# Patient Record
Sex: Female | Born: 1965
Health system: Southern US, Community
[De-identification: ages and names within clinical notes are randomized; demographics above are authoritative.]

## PROBLEM LIST (undated history)

## (undated) DIAGNOSIS — Z8639 Personal history of other endocrine, nutritional and metabolic disease: Secondary | ICD-10-CM

## (undated) DIAGNOSIS — R7303 Prediabetes: Secondary | ICD-10-CM

## (undated) DIAGNOSIS — E119 Type 2 diabetes mellitus without complications: Secondary | ICD-10-CM

## (undated) DIAGNOSIS — S83207A Unspecified tear of unspecified meniscus, current injury, left knee, initial encounter: Secondary | ICD-10-CM

## (undated) HISTORY — DX: Prediabetes: R73.03

## (undated) HISTORY — DX: Type 2 diabetes mellitus without complications: E11.9

## (undated) HISTORY — PX: KNEE ARTHROSCOPY: SUR90

---

## 1998-07-15 ENCOUNTER — Ambulatory Visit (HOSPITAL_COMMUNITY): Admission: RE | Admit: 1998-07-15 | Discharge: 1998-07-15 | Payer: Self-pay | Admitting: *Deleted

## 1998-11-20 ENCOUNTER — Inpatient Hospital Stay (HOSPITAL_COMMUNITY): Admission: AD | Admit: 1998-11-20 | Discharge: 1998-11-22 | Payer: Self-pay | Admitting: *Deleted

## 2000-03-08 ENCOUNTER — Ambulatory Visit (HOSPITAL_COMMUNITY): Admission: RE | Admit: 2000-03-08 | Discharge: 2000-03-08 | Payer: Self-pay | Admitting: *Deleted

## 2000-03-08 ENCOUNTER — Encounter: Payer: Self-pay | Admitting: *Deleted

## 2000-07-28 ENCOUNTER — Inpatient Hospital Stay (HOSPITAL_COMMUNITY): Admission: AD | Admit: 2000-07-28 | Discharge: 2000-07-30 | Payer: Self-pay | Admitting: *Deleted

## 2000-10-15 ENCOUNTER — Ambulatory Visit (HOSPITAL_COMMUNITY): Admission: RE | Admit: 2000-10-15 | Discharge: 2000-10-15 | Payer: Self-pay | Admitting: *Deleted

## 2000-10-15 HISTORY — PX: OTHER SURGICAL HISTORY: SHX169

## 2002-09-12 ENCOUNTER — Inpatient Hospital Stay (HOSPITAL_COMMUNITY): Admission: AD | Admit: 2002-09-12 | Discharge: 2002-09-15 | Payer: Self-pay | Admitting: Internal Medicine

## 2002-09-13 ENCOUNTER — Encounter: Payer: Self-pay | Admitting: Internal Medicine

## 2002-12-12 ENCOUNTER — Emergency Department (HOSPITAL_COMMUNITY): Admission: EM | Admit: 2002-12-12 | Discharge: 2002-12-12 | Payer: Self-pay | Admitting: Emergency Medicine

## 2006-12-29 ENCOUNTER — Other Ambulatory Visit: Admission: RE | Admit: 2006-12-29 | Discharge: 2006-12-29 | Payer: Self-pay | Admitting: Family Medicine

## 2007-03-16 ENCOUNTER — Ambulatory Visit (HOSPITAL_COMMUNITY): Admission: RE | Admit: 2007-03-16 | Discharge: 2007-03-16 | Payer: Self-pay | Admitting: Obstetrics and Gynecology

## 2007-03-16 HISTORY — PX: OTHER SURGICAL HISTORY: SHX169

## 2007-11-05 ENCOUNTER — Emergency Department (HOSPITAL_COMMUNITY): Admission: EM | Admit: 2007-11-05 | Discharge: 2007-11-05 | Payer: Self-pay | Admitting: Family Medicine

## 2010-10-02 ENCOUNTER — Other Ambulatory Visit
Admission: RE | Admit: 2010-10-02 | Discharge: 2010-10-02 | Payer: Self-pay | Source: Home / Self Care | Admitting: Gynecology

## 2010-10-02 ENCOUNTER — Ambulatory Visit: Payer: Self-pay | Admitting: Women's Health

## 2010-10-06 ENCOUNTER — Ambulatory Visit: Payer: Self-pay | Admitting: Women's Health

## 2010-11-26 ENCOUNTER — Other Ambulatory Visit: Payer: Self-pay | Admitting: Women's Health

## 2010-11-26 DIAGNOSIS — Z1239 Encounter for other screening for malignant neoplasm of breast: Secondary | ICD-10-CM

## 2010-12-08 ENCOUNTER — Ambulatory Visit
Admission: RE | Admit: 2010-12-08 | Discharge: 2010-12-08 | Disposition: A | Discharge status: Home / Self Care | Payer: 59 | Source: Ambulatory Visit | Attending: Women's Health | Admitting: Women's Health

## 2010-12-08 DIAGNOSIS — Z1239 Encounter for other screening for malignant neoplasm of breast: Secondary | ICD-10-CM

## 2010-12-11 ENCOUNTER — Ambulatory Visit (INDEPENDENT_AMBULATORY_CARE_PROVIDER_SITE_OTHER): Payer: 59 | Admitting: Gynecology

## 2010-12-11 DIAGNOSIS — N63 Unspecified lump in unspecified breast: Secondary | ICD-10-CM

## 2010-12-15 ENCOUNTER — Other Ambulatory Visit: Payer: Self-pay | Admitting: Gynecology

## 2010-12-15 DIAGNOSIS — N6314 Unspecified lump in the right breast, lower inner quadrant: Secondary | ICD-10-CM

## 2010-12-17 ENCOUNTER — Other Ambulatory Visit: Payer: 59

## 2010-12-22 ENCOUNTER — Ambulatory Visit
Admission: RE | Admit: 2010-12-22 | Discharge: 2010-12-22 | Disposition: A | Discharge status: Home / Self Care | Payer: 59 | Source: Ambulatory Visit | Attending: Gynecology | Admitting: Gynecology

## 2010-12-22 DIAGNOSIS — N6314 Unspecified lump in the right breast, lower inner quadrant: Secondary | ICD-10-CM

## 2011-03-13 NOTE — Op Note (Signed)
NAMECHANTELLA, [name]             ACCOUNT NO.:  [id number]   MEDICAL RECORD NO.:  [id number]          PATIENT TYPE:  AMB   LOCATION:  SDC                           FACILITY:  [facility]   PHYSICIAN:  Charles A. Delcambre, MDDATE OF BIRTH:  1966/09/30   DATE OF PROCEDURE:  03/16/2007  DATE OF DISCHARGE:                               OPERATIVE REPORT   PREOPERATIVE DIAGNOSIS:  Menorrhagia with failed medical management.   POSTOPERATIVE DIAGNOSIS:  Menorrhagia with failed medical management.   PROCEDURE:  1. ThermaChoice endometrial ablation.  2. Paracervical block.   SURGEON:  Charles A. Sydnee Cabal, M.D.   ASSISTANT:  None.   ASSISTANT:  None.   COMPLICATIONS:  None.   ESTIMATED BLOOD LOSS:  Minimal.   ANESTHESIA:  Moderate anesthesia care with IV sedation.   FINDINGS:  Sounded to 9 cm.  The instrument, sponge and needle counts  correct x2.   DESCRIPTION OF PROCEDURE:  The patient was taken to the operating room  and placed in the supine position.  Anesthesia was dosed without  difficulty.  She was then placed in the dorsal lithotomy position and a  sterile prep and drape was undertaken.  The anterior lip of the cervix  was grasped with a single-tooth tenaculum.  A weighted speculum was used  in the vagina.  The cervix was descended to the introitus with traction.  The sound was to 9 cm.  The ThermaChoice was then used.  Pressure  stabilized at 170 and gradually went down to 155 to 158.  During the  procedure, a full eight-minute therapy cycle was undertaken without  difficulty.  The tenaculum was removed.  She was awakened.  Hemostasis  was adequate.   She was taken to the recovery room with the physician in attendance,  having tolerated the procedure well.      Charles A. Sydnee Cabal, MD  Electronically Signed    CAD/MEDQ  D:  03/16/2007  T:  03/16/2007  Job:  [id number]

## 2011-03-13 NOTE — H&P (Signed)
NAMEKRIPA, FOSKEY             ACCOUNT NO.:  [id number]   MEDICAL RECORD NO.:  [id number]          PATIENT TYPE:  AMB   LOCATION:  SDC                           [facility]ILITY[facility]  [facility]   PHYSICIAN:  Charles A. Delcambre, MDDATE OF BIRTH:  [birth year]   DATE OF ADMISSION:  DATE OF DISCHARGE:                              HISTORY & PHYSICAL   HISTORY:  This patient is to be admitted Mar 02, 2007 to undergo  endometrial ablation, planned ThermaChoice, possible roller ball if  ThermaChoice technically fails.   She is a 45 year old gravida VII, para V, 0-2-4.  She has had tubal  ligation for contraception.  Last pap was within one year and was  normal.  Endometrial biopsy was negative on 01/21/07.  Sound was to 9 cm.  She is being admitted after reviewing literature for ThermaChoice  ablation, but understands we may proceed if this fails technically to  get started with roller ball ablation.  She accepts risks of infection,  bleeding, uterine perforation, bowel damage, bladder or rectal damage,  fluid overload.  All questions were answered.  She will expect 80 to 90%  eumenorrhea rate, 30 to 40% amenorrhea rate as far as my experience and  is in agreement to proceed on with heavy bleeding and clots not  responding to OTC's further.   PAST MEDICAL HISTORY:  None.   PAST SURGICAL HISTORY:  Tubal ligation.   MEDICATIONS:  Iron and multivitamins.   ALLERGIES:  No known drug allergies.   SOCIAL HISTORY:  No tobacco, ethanol or drug use.  STD exposure in the  past.  She is married.  Monogamous relationship with her husband.  Tubal  ligation has been done.  Condoms are not used.   FAMILY HISTORY:  Hypertension.  No major illness otherwise.   REVIEW OF SYSTEMS:  No fever, chills, rashes, lesions, headache,  dizziness.  Some seasonal allergies.  No chest pain, shortness of  breath, wheezing, diarrhea, constipation, bleeding, melena,  hematochezia, urgency, frequency, dysuria, incontinence,  hematuria,  galactorrhea or emotional changes.   PHYSICAL EXAMINATION:  GENERAL:  Alert and oriented x3.  VITAL SIGNS:  Blood pressure 120/80, pulse 72, respirations 16,  afebrile.  LMP was 02/08/07.  Weight is 201 pounds.  Height is 5 foot 8  inches.  HEART:  Regular rate and rhythm without murmur.  LUNGS:  Clear bilaterally.  ABDOMEN:  Soft, flat, nontender.  No hepatosplenomegaly or other masses  noted.  PELVIC:  Normal external female genitalia.  Bartholin and Skene glands  intact.  Vault without discharge or lesions.   Ultrasound did show ovaries to be normal.  There were numerous 1.5 cm  fibroids, mostly in the upper uterus, otherwise uterus size 10.2 cm x  5.6 cm x 6.3 cm.  This was not readily palpated secondary to body  habitus.  No masses were palpable.   ASSESSMENT:  Menorrhagia, failing medical management.   PLAN:  We did discuss Micronor, Depo-Provera, cycle Prometrium and  Provera, endometrial ablation, hysterectomy.  ThermaChoice will be  given.  If this were to fail we would proceed with hysterectomy likely.  All  questions were answered.  She gives informed consent.  She will  remain NPO past midnight.  Preop consent for ThermaChoice ablation,  possible roller ball use and preop CBC and will go ahead and do a urine  pregnancy test just to be on the safe side.  All questions are answered  and we will proceed as outlined.      Charles A. Sydnee Cabal, MD  Electronically Signed     CAD/MEDQ  D:  03/02/2007  T:  03/02/2007  Job:  [id number]

## 2011-03-13 NOTE — H&P (Signed)
NAME:  Heidi, [name] NO.:  [id number]   MEDICAL RECORD NO.:  [id number]                   PATIENT TYPE:  INP   LOCATION:  5743                                 FACILITY:  [facility]   PHYSICIAN:  Marcene Duos, M.D.         DATE OF BIRTH:  04/11/1966   DATE OF ADMISSION:  09/12/2002  DATE OF DISCHARGE:                                HISTORY & PHYSICAL   CHIEF COMPLAINT:  Fever and left flank pain.   HISTORY OF PRESENT ILLNESS:  This is a 45 year old female patient of mine I  saw yesterday in the office with a fever up to 103.8 and a history of left  flank pain for the third day and myalgias.  The patient was found on  examination to have low left back tenderness possibly flank tenderness, some  left abdominal discomfort on palpation, but no rebound or peritoneal signs.  Urinalysis showed 2+ leukocytes, positive nitrites, trace protein, 2+ blood.  White count was 7.8 with 86.2% granulocytes, 12% lymphocytes.  Hemoglobin  was 10.1, MCV 81, platelets 207.  BUN and creatinine were 3 and 1.1,  respectively.  The patient was felt to have left pyelonephritis, and was  given 2 g of IM ceftriaxone and to follow up today.   On history today, the patient is worse with fevers, chills, myalgias,  weakness, and now with nausea which she had not had previously, though she  has had no vomiting yet.  She is taking in some fluids, but is having  difficulties doing so.  She has only urinated once today.  She denies any  diarrhea, melena, hematochezia, and no dysuria.   PAST MEDICAL HISTORY:  1. Gravida 4, para 4.  2. Bilateral tubal ligation in 12/01.   MEDICATIONS:  Ceftriaxone 2 g IM x1.   ALLERGIES:  No known drug allergies.   FAMILY HISTORY:  Mother died at age 53 in June 15, 2023, of what sounds like  metastatic throat cancer.  Her father also died at 21 of the same.  Brother  age 25 has been diagnosed with prostate cancer.  She has four healthy  children.   SOCIAL HISTORY:  She has been married for 10 years, she is a housewife.   REVIEW OF SYMPTOMS:  Negative except for the above.   PHYSICAL EXAMINATION:  VITAL SIGNS:  Temperature 103.4, blood pressure  118/80, pulse 100.  HEENT:  Mucous membranes are moist.  The patient is tearful.  Conjunctivae  without injection.  Tympanic membranes are clear.  Throat is without  injection.  NECK:  Supple without adenopathy.  CHEST:  Clear.  CARDIOVASCULAR:  Regular rate and rhythm with normal S1 and S2, no S3, S4,  or murmur appreciated.  Carotid, radial, femoral, dorsalis pedis, and  posterior tibialis pulses are normal, dynamic, and equal.  ABDOMEN:  Soft, bowel sounds present throughout.  She does have some  tenderness in the left mid to lower abdomen and has  definite left flank  tenderness today.  No rebound, no peritoneal signs.  No organomegaly or  masses appreciated.  NEUROLOGIC:  Alert and oriented x3.  Cranial nerves II-XII intact.  Motor is  grossly normal.  GENERAL:  The patient is actively chilling in the room.   ASSESSMENT AND PLAN:  1. Left pyelonephritis with failure of outpatient treatment.  Because she     appears fairly ill at this time, I am going to check a renal ultrasound     to rule out obstruction.  We will admit her for inpatient treatment,     switch her antibiotics to IV Cipro.  We will check a CBC and a BMET in     the morning.  Her urine culture from yesterday is still pending.     Darvocet for pain, fever, as well as ibuprofen for pain and fever     intermittently.  I have written for morphine if she is unable to take     p.o. for pain medications, and most importantly IV fluids at this point.  2. Anemia, normocytic, possible marrow suppression.  Again, we will check a     CBC tomorrow.  If that continues to drop, we will check iron studies,     workup otherwise as an outpatient if it continues.                                               Marcene Duos,  M.D.    EMM/MEDQ  D:  09/13/2002  T:  09/13/2002  Job:  [id number]

## 2011-03-13 NOTE — H&P (Signed)
[facility]  Patient:    [name], [name]                    MRN: [id number] Adm. Date:  [id number] Disc. Date: [id number] Attending:  Deniece Ree                         History and Physical  CHIEF COMPLAINT:              The patient is a 45 year old multiparous female who desires permanent sterilization.  HISTORY OF PRESENT ILLNESS:   The patient understands the different types of contraception available; however, is very adamant about having permanent sterilization done.  She understands the possible complications and she understands the procedure.  She also understands that this procedure is intended to be permanent; however, it cannot be guaranteed.  PAST MEDICAL HISTORY:         Noncontributory.  FAMILY HISTORY:               Noncontributory.  REVIEW OF SYSTEMS:            Noncontributory.  PHYSICAL EXAMINATION:  GENERAL:                      The patient is a well-developed, well-nourished female in no acute distress.  HEENT:                        Within normal limits.  NECK:                         Supple.  BREAST:                       Without masses, tenderness, or discharge.  LUNGS:                        Clear to auscultation and percussion.  HEART:                        Normal sinus rhythm without murmurs, rubs, or gallops.  ABDOMEN:                      Benign.  EXTREMITIES:                  Within normal limits.  NEUROLOGIC:                   Within normal limits.  PELVIC:                       External genitalia and BUS within normal limits. The vagina is clear.  The cervix is firm and nontender.  The uterus is normal size, shape, and consistency.  Adnexa benign.  DIAGNOSIS:                    Multiparity with desire for permanent sterilization.  PLAN:                         The plan is for bilateral tubal cauterization with tubal resection. DD:  10/15/00 TD:  10/15/00 Job: [id number]

## 2011-03-13 NOTE — Discharge Summary (Signed)
NAME:  [name], [name]                       ACCOUNT NO.:  [id number]   MEDICAL RECORD NO.:  [id number]                   PATIENT TYPE:  INP   LOCATION:  5743                                 FACILITY:  [facility]   PHYSICIAN:  Marcene Duos, M.D.         DATE OF BIRTH:  06-22-1966   DATE OF ADMISSION:  09/12/2002  DATE OF DISCHARGE:  09/15/2002                                 DISCHARGE SUMMARY   RADIOLOGIC FINDINGS:  Renal ultrasound 09/13/02 normal.   ADMISSION DIAGNOSES:  1. Left pyelonephritis.  2. Normocytic anemia.   DISCHARGE DIAGNOSES:  1. Left pyelonephritis.  2. Microcytic anemia, iron deficiency secondary to #3.  3. Menometrorrhagia.   HISTORY OF PRESENT ILLNESS/HOSPITAL COURSE:  This is a 45 year old female  patient who failed outpatient treatment of left pyelonephritis. She  presented to the office the day prior to admission with fever up to 103.8  and history of left flank pain for three days with myalgias. She is found to  have low left back tenderness on exam as well as some left mid abdominal  discomfort. White count was 10.8 with 86.2% granulocytes, hemoglobin 10.1,  BUN and creatinine were 3 and 1.1 respectively. Urinalysis showed a large  amount of leukocytes, positive nitrates, and many bacteria. The patient  received 2 g of IM ceftriaxone in the office, and she was taking fluids well  at that point. The following day, however, she was feeling worse with  fevers, chills, myalgias, weakness, and with the addition of nausea and the  inability to take fluids well. The patient's temperature was also 103.4 with  continued left flank tenderness on admission exam.   HOSPITAL COURSE:  Problem 1. Left pyelonephritis. The patient was switched  to IV Cipro, and on admission and because of her worsening state, a renal  ultrasound was performed the morning of 09/12/02 which showed no evidence of  hydronephrosis or other abnormality. The patient actually was  feeling better  by the following morning but still with low grade temperature. She was  starting to take fluids well. By 11/19, she was much better with no left  tenderness but continued with fever through 09/14/02. Today on 09/15/02, she  has been afebrile for 24 hours. She is otherwise asymptomatic, taking fluids  well, and is stable. The patient was continued on IV fluids for hydration  throughout the hospitalization. Those were discontinued at time of  discharge, as she was taking p.o. well.  Problem 2. Anemia, now microcytic. The patient during her hospitalization  did remind me that she has a history of menometrorrhagia. We have placed her  on birth control pills last spring, but she has not been good about taking  them consistently and continues to have problems with menstrual bleeding.  Her hemoglobin was down to 9.2 on 09/13/02, remained stable throughout the  rest of the hospitalization, and she was initiated on iron at discharge,  ferrous sulfate 325 mg twice daily.  Problem 3. Leukopenia. The patient developed low white cell count to a low  of 3.2 at discharge. This was felt secondary probably to marrow suppression  with her illness.    DISCHARGE INSTRUCTIONS:  The patient is thus discharged in stable condition  to continue Cipro 500 mg p.o. b.i.d. for 11 more days, to continue pushing  fluids, and ferrous sulfate 325 mg twice daily. She will follow up with me  in two weeks, and we will discuss whether there are other options that are  better for her for treatment of her menometrorrhagia.                                               Marcene Duos, M.D.    EMM/MEDQ  D:  09/15/2002  T:  09/15/2002  Job:  [id number]

## 2011-03-13 NOTE — Op Note (Signed)
[facility]  Patient:    [name], [name]                    MRN: [id number] Proc. Date: 10/15/00 Adm. Date:  [id number] Disc. Date: [id number] Attending:  Deniece Ree                           Operative Report  PREOPERATIVE DIAGNOSES:       Multiparity, desire for permanent sterilization.  POSTOPERATIVE DIAGNOSES:      Multiparity, desire for permanent sterilization.  OPERATION:                    Laparoscopic bilateral tubal cauterization with tubal resection.  SURGEON:                      Deniece Ree, M.D.  ANESTHESIA:                   General.  ESTIMATED BLOOD LOSS:         Less than 25 cc.  COMPLICATIONS:                None.                                Patient tolerated the procedure well and returned to the recovery room in satisfactory condition.  PROCEDURE:                    Patient was taken to the operating room, prepped and draped in the usual fashion for laparoscopic surgery.  A speculum was placed in the vagina following which the anterior lip of the cervix was then grasped with a Christella Hartigan tenaculum.  The acorn uterine manipulating cannula was put into place.  A subumbilical incision was then made following which the Veress needle was introduced through this incision and approximately 3 L of carbon dioxide was then infused without any difficulty.  The laparoscopic Trocar was placed through this incision following which the laparoscope was placed through its sleeve.  Visualization of the pelvic organs came into view.  Uterus, tubes, and ovaries all appeared to be within normal limits. The right tube was then grasped approximately 5 mm proximal to the right cornu and cauterized approximately 3-4 cm in length.  This was done likewise on the opposite side.  Both cauterized tubal segments were then cut in two locations. At this point hemostasis was present.  Sponge and needle count was correct x 2.  The carbon dioxide was  then allowed to escape from the abdominal cavity which it did so without any difficulty.  The incision was then closed, first with a deep interrupted stitch followed by a subcuticular stitch using 4-0 Vicryl.  Procedure terminated.  Patient tolerated procedure well and returned to the recovery room in satisfactory condition.  Patient is to be discharged when fully alert.  She has been instructed on the possible complications and care following this type of surgery.  She will be told to return to my office in four weeks for follow-up evaluation or to call me prior to that time should any problems arise. DD:  10/15/00 TD:  10/15/00 Job: [id number]

## 2011-07-07 ENCOUNTER — Other Ambulatory Visit: Payer: Self-pay

## 2011-07-07 ENCOUNTER — Other Ambulatory Visit: Payer: Self-pay | Admitting: Gynecology

## 2011-07-07 MED ORDER — IBUPROFEN 600 MG PO TABS: 600.0000 mg | ORAL_TABLET | Freq: Three times a day (TID) | ORAL | Status: AC | PRN

## 2011-07-07 NOTE — Telephone Encounter (Signed)
ROUTED RX TO PHARMACY.

## 2011-07-07 NOTE — Telephone Encounter (Signed)
Okayed her refill.

## 2011-07-07 NOTE — Telephone Encounter (Signed)
PT. REQUESTING REFILL ON GENERIC MOTRIN 600MG . FOR SEVERE PERIOD CRAMPS. SEE NANCY'S NURSE NOTE OF 12-22-10. IF OK I WILL SEND TO HER [facility].

## 2011-07-15 LAB — POCT RAPID STREP A: Streptococcus, Group A Screen (Direct): POSITIVE — AB

## 2011-07-28 ENCOUNTER — Other Ambulatory Visit: Payer: Self-pay | Admitting: Gynecology

## 2011-07-29 ENCOUNTER — Other Ambulatory Visit: Payer: Self-pay | Admitting: *Deleted

## 2011-07-29 DIAGNOSIS — N63 Unspecified lump in unspecified breast: Secondary | ICD-10-CM

## 2011-08-13 ENCOUNTER — Ambulatory Visit
Admission: RE | Admit: 2011-08-13 | Discharge: 2011-08-13 | Disposition: A | Discharge status: Home / Self Care | Payer: 59 | Source: Ambulatory Visit | Attending: Obstetrics and Gynecology | Admitting: Obstetrics and Gynecology

## 2011-08-13 DIAGNOSIS — N63 Unspecified lump in unspecified breast: Secondary | ICD-10-CM

## 2011-11-26 ENCOUNTER — Ambulatory Visit (INDEPENDENT_AMBULATORY_CARE_PROVIDER_SITE_OTHER): Payer: 59 | Admitting: Women's Health

## 2011-11-26 ENCOUNTER — Other Ambulatory Visit (HOSPITAL_COMMUNITY)
Admission: RE | Admit: 2011-11-26 | Discharge: 2011-11-26 | Disposition: A | Discharge status: Home / Self Care | Payer: 59 | Source: Ambulatory Visit | Attending: Obstetrics and Gynecology | Admitting: Obstetrics and Gynecology

## 2011-11-26 ENCOUNTER — Encounter: Payer: Self-pay | Admitting: Women's Health

## 2011-11-26 VITALS — BP 120/80 | Ht 68.25 in | Wt 216.0 lb

## 2011-11-26 DIAGNOSIS — Z833 Family history of diabetes mellitus: Secondary | ICD-10-CM

## 2011-11-26 DIAGNOSIS — Z13220 Encounter for screening for lipoid disorders: Secondary | ICD-10-CM

## 2011-11-26 DIAGNOSIS — Z01419 Encounter for gynecological examination (general) (routine) without abnormal findings: Secondary | ICD-10-CM

## 2011-11-26 DIAGNOSIS — N946 Dysmenorrhea, unspecified: Secondary | ICD-10-CM

## 2011-11-26 DIAGNOSIS — R319 Hematuria, unspecified: Secondary | ICD-10-CM

## 2011-11-26 DIAGNOSIS — R7309 Other abnormal glucose: Secondary | ICD-10-CM

## 2011-11-26 LAB — URINALYSIS W MICROSCOPIC + REFLEX CULTURE
Bilirubin Urine: NEGATIVE
Crystals: NONE SEEN
Leukocytes, UA: NEGATIVE
Protein, ur: NEGATIVE mg/dL
Specific Gravity, Urine: 1.025 (ref 1.005–1.030)
Urobilinogen, UA: 0.2 mg/dL (ref 0.0–1.0)

## 2011-11-26 LAB — CBC WITH DIFFERENTIAL/PLATELET
Basophils Absolute: 0 10*3/uL (ref 0.0–0.1)
Basophils Relative: 1 % (ref 0–1)
Eosinophils Absolute: 0.1 10*3/uL (ref 0.0–0.7)
MCH: 28.5 pg (ref 26.0–34.0)
MCHC: 33 g/dL (ref 30.0–36.0)
Monocytes Absolute: 0.2 10*3/uL (ref 0.1–1.0)
Monocytes Relative: 5 % (ref 3–12)
Neutro Abs: 3.2 10*3/uL (ref 1.7–7.7)
Neutrophils Relative %: 60 % (ref 43–77)
RDW: 12.9 % (ref 11.5–15.5)

## 2011-11-26 LAB — LIPID PANEL
Cholesterol: 177 mg/dL (ref 0–200)
Total CHOL/HDL Ratio: 4.5 Ratio
VLDL: 20 mg/dL (ref 0–40)

## 2011-11-26 LAB — GLUCOSE, RANDOM: Glucose, Bld: 113 mg/dL — ABNORMAL HIGH (ref 70–99)

## 2011-11-26 MED ORDER — IBUPROFEN 600 MG PO TABS: 600.0000 mg | ORAL_TABLET | Freq: Four times a day (QID) | ORAL | Status: DC | PRN

## 2011-11-26 NOTE — Progress Notes (Signed)
Addended by: Venora Maples on: 11/26/2011 12:46 PM   Modules accepted: Orders

## 2011-11-26 NOTE — Progress Notes (Signed)
Addended by: Venora Maples on: 11/26/2011 10:34 AM   Modules accepted: Orders

## 2011-11-26 NOTE — Progress Notes (Signed)
[name] 06/07/1966 [id number]    History:    The patient presents for annual exam.  Monthly 4-5 day light cycle with severe cramps. Minimal relief with Motrin, minimal relief with OCs in the past.  History of fibroids, BTL and endometrial ablation in 08. History of normal Paps. History of a questionable breast cyst on mammogram, scheduled for followup ultrasound with possible biopsy next week.   Past medical history, past surgical history, family history and social history were all reviewed and documented in the EPIC chart. 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   ROS:  A  ROS was performed and pertinent positives and negatives are included in the history.  Exam:  Filed Vitals:   11/26/11 0856  BP: 120/80    General appearance:  Normal Head/Neck:  Normal, without cervical or supraclavicular adenopathy. Thyroid:  Symmetrical, normal in size, without palpable masses or nodularity. Respiratory  Effort:  Normal  Auscultation:  Clear without wheezing or rhonchi Cardiovascular  Auscultation:  Regular rate, without rubs, murmurs or gallops  Edema/varicosities:  Not grossly evident Abdominal  Soft,nontender, without masses, guarding or rebound.  Liver/spleen:  No organomegaly noted  Hernia:  None appreciated  Skin  Inspection:  Grossly normal  Palpation:  Grossly normal Neurologic/psychiatric  Orientation:  Normal with appropriate conversation.  Mood/affect:  Normal  Genitourinary    Breasts: Examined lying and sitting.     Right: Without masses, retractions, discharge or axillary adenopathy.     Left: Without masses, retractions, discharge or axillary adenopathy.   Inguinal/mons:  Normal without inguinal adenopathy  External genitalia:  Normal  BUS/Urethra/Skene's glands:  Normal  Bladder:  Normal  Vagina:  Normal  Cervix:  Normal  Uterus:  anteverted, fibroids,  Midline and mobile  Adnexa/parametria:     Rt: Without masses or  tenderness.   Lt: Without masses or tenderness.  Anus and perineum: Normal  Digital rectal exam: Normal sphincter tone without palpated masses or tenderness  Assessment/Plan:  46 y.o. MBF G7 P4  for annual exam with complaint of severe dysmenorrhea.  Dysmenorrhea/fibroids/ablation in 08 Right breast questionable cyst  Plan: Motrin 600 prescription, proper use reviewed for dysmenorrhea . Options reviewed, discussed hysterectomy, slight risk for infection, hemorrhage, perforation of organs. Hand out given. Instructed to schedule appointment with Dr. Audie Box to discuss if chooses. SBE's, annual mammogram, keep scheduled appointment for ultrasound was questionable biopsy next week. Instructed to increase regular exercise, cut calories for weight loss, calcium rich diet, MVI daily. CBC, glucose, lipid profile, UA and Pap.    Harrington Challenger [facility], 9:52 AM 11/26/2011

## 2011-11-27 ENCOUNTER — Other Ambulatory Visit: Payer: Self-pay | Admitting: Obstetrics and Gynecology

## 2011-11-27 DIAGNOSIS — Z1231 Encounter for screening mammogram for malignant neoplasm of breast: Secondary | ICD-10-CM

## 2011-11-28 LAB — URINE CULTURE: Colony Count: >=100000

## 2011-12-01 NOTE — Progress Notes (Signed)
Addended by: Venora Maples on: 12/01/2011 02:29 PM   Modules accepted: Orders

## 2011-12-18 ENCOUNTER — Ambulatory Visit (INDEPENDENT_AMBULATORY_CARE_PROVIDER_SITE_OTHER): Payer: 59 | Admitting: Gynecology

## 2011-12-18 ENCOUNTER — Encounter: Payer: Self-pay | Admitting: Gynecology

## 2011-12-18 VITALS — BP 128/84

## 2011-12-18 DIAGNOSIS — N946 Dysmenorrhea, unspecified: Secondary | ICD-10-CM

## 2011-12-18 NOTE — Progress Notes (Signed)
Patient presents to discuss treatment options for her dysmenorrhea. Patient normally sees [place] and had a complete annual exam this year. She had a ThermaChoice endometrial ablation by Dr. Sydnee Cabal in 2009 initially did well with no bleeding and no discomfort. Her menses have returned light but her pain and cramping is significantly to her bedridden for several days each month. She has no pain in between her menses and no dyspareunia.  Exam with Elane Fritz chaperone present Abdomen soft nontender without masses guarding rebound organomegaly Pelvic external BUS vagina normal. Cervix normal. Uterus grossly normal size midline mobile nontender. Adnexa without masses or tenderness.  Assessment and plan:46 year old G7 P. For Ab3 female status post tubal sterilization with significant dysmenorrhea status post endometrial ablation. Recommend we start with ultrasound to rule out significant hematometria or other pathology. I reviewed options with her to include hormonal suppression/attempted D&C/hysterectomy. I think in her situation a vaginal hysterectomy would be appropriate I reviewed with her which involved with this. Patient wants to think of her options and she will follow up with me after the ultrasound.

## 2011-12-18 NOTE — Patient Instructions (Signed)
Follow up for ultrasound. 

## 2011-12-24 ENCOUNTER — Ambulatory Visit: Payer: 59

## 2011-12-25 ENCOUNTER — Ambulatory Visit (INDEPENDENT_AMBULATORY_CARE_PROVIDER_SITE_OTHER): Payer: 59

## 2011-12-25 ENCOUNTER — Encounter: Payer: Self-pay | Admitting: Gynecology

## 2011-12-25 ENCOUNTER — Ambulatory Visit (INDEPENDENT_AMBULATORY_CARE_PROVIDER_SITE_OTHER): Payer: 59 | Admitting: Gynecology

## 2011-12-25 DIAGNOSIS — N946 Dysmenorrhea, unspecified: Secondary | ICD-10-CM

## 2011-12-25 DIAGNOSIS — N949 Unspecified condition associated with female genital organs and menstrual cycle: Secondary | ICD-10-CM

## 2011-12-25 DIAGNOSIS — R102 Pelvic and perineal pain: Secondary | ICD-10-CM

## 2011-12-25 NOTE — Progress Notes (Signed)
Patient presents for ultrasound due to her history of worsening pelvic cramping, status post endometrial ablation. Ultrasound shows multiple small myomas. Endometrial echo 8.8 mm without evidence of hematometria. Right/left ovaries visualized and normal. I reviewed the situation with the patient and again her options and she wants to proceed with hysterectomy. I think she would be a good candidate for a vaginal hysterectomy having 4 vaginal births and we will go ahead and schedule her for this. The ovarian conservation issue was reviewed with her and the options of keeping her ovaries versus removing them discussed. The issues of continued hormone production also the continued risk for ovarian disease up to and including ovarian cancer versus removing her ovaries and the issues of hypoestrogenism, symptoms, accelerated cardiovascular and osteoporotic risk and the issue of ERT risk/benefits was all discussed and she would want to keep her ovaries. Patient will follow for her preop consult once her surgery is scheduled

## 2011-12-25 NOTE — Patient Instructions (Signed)
Office will contact you to schedule surgery. 

## 2011-12-28 ENCOUNTER — Telehealth: Payer: Self-pay

## 2011-12-28 NOTE — Telephone Encounter (Signed)
Spoke with patient. She wanted to schedule her TV[facility] the week of Spring Break. She picked April 1st and I scheduled her that day at 7:30am. Her preop consult with Dr. Velvet Bathe is 3/26 and she will expect a call from [facility] regarding her preop appt there as well.

## 2012-01-04 ENCOUNTER — Encounter (HOSPITAL_COMMUNITY): Payer: Self-pay

## 2012-01-18 ENCOUNTER — Encounter (HOSPITAL_COMMUNITY): Payer: Self-pay

## 2012-01-18 ENCOUNTER — Encounter (HOSPITAL_COMMUNITY)
Admission: RE | Admit: 2012-01-18 | Discharge: 2012-01-18 | Disposition: A | Discharge status: Home / Self Care | Payer: 59 | Source: Ambulatory Visit | Attending: Gynecology | Admitting: Gynecology

## 2012-01-18 LAB — CBC
HCT: 37 % (ref 36.0–46.0)
Hemoglobin: 12.9 g/dL (ref 12.0–15.0)
MCHC: 34.9 g/dL (ref 30.0–36.0)
RBC: 4.37 MIL/uL (ref 3.87–5.11)
WBC: 7 10*3/uL (ref 4.0–10.5)

## 2012-01-18 LAB — SURGICAL PCR SCREEN: Staphylococcus aureus: NEGATIVE

## 2012-01-18 NOTE — Patient Instructions (Signed)
YOUR PROCEDURE IS SCHEDULED ON: 01/25/12  ENTER THROUGH THE MAIN ENTRANCE OF [facility] H[facility] AT: 0600 am  USE DESK PHONE AND DIAL [phone] TO INFORM us OF YOUR ARRIVAL  CALL [phone] IF YOU HAVE ANY QUESTIONS OR PROBLEMS PRIOR TO YOUR ARRIVAL.  REMEMBER: DO NOT EAT OR DRINK AFTER MIDNIGHT : Sunday  SPECIAL INSTRUCTIONS:   YOU MAY BRUSH YOUR TEETH THE MORNING OF SURGERY   TAKE THESE MEDICINES THE DAY OF SURGERY WITH SIP OF WATER:none   DO NOT WEAR JEWELRY, EYE MAKEUP, LIPSTICK OR DARK FINGERNAIL POLISH DO NOT WEAR LOTIONS  DO NOT SHAVE FOR 48 HOURS PRIOR TO SURGERY  YOU WILL NOT BE ALLOWED TO DRIVE YOURSELF HOME.  NAME OF DRIVERDelila West- [phone]- home

## 2012-01-19 ENCOUNTER — Ambulatory Visit (INDEPENDENT_AMBULATORY_CARE_PROVIDER_SITE_OTHER): Payer: 59 | Admitting: Gynecology

## 2012-01-19 ENCOUNTER — Encounter: Payer: Self-pay | Admitting: Gynecology

## 2012-01-19 DIAGNOSIS — D259 Leiomyoma of uterus, unspecified: Secondary | ICD-10-CM

## 2012-01-19 DIAGNOSIS — N946 Dysmenorrhea, unspecified: Secondary | ICD-10-CM

## 2012-01-19 NOTE — H&P (Signed)
  [name] July 26, 1966 [id number]   History and Physical    Chief complaint: dysmenorrhea, leiomyoma  History of present illness: 46 y.o.  G7 P4 status post tubal sterilization and ThermaChoice endometrial ablation with worsening unacceptable dysmenorrhea. Her menses are relatively light but she is having disabling cramping with them. Ultrasound showed multiple small myomas, no evidence of hematometria.  Options for management were reviewed and she elects for vaginal hysterectomy.   Past medical history,surgical history, medications, allergies, family history and social history were all reviewed and documented in the EPIC chart. ROS:  Was performed and pertinent positives and negatives are included in the history of present illness.  Exam: General: well developed, well nourished female, no acute distress HEENT: normal  Lungs: clear to auscultation without wheezing, rales or rhonchi  Cardiac: regular rate without rubs, murmurs or gallops  Abdomen: soft, nontender without masses, guarding, rebound, organomegaly  Pelvic: external bus vagina: normal   Cervix: grossly normal  Uterus: normal size, midline and mobile, nontender  Adnexa: without masses or tenderness     Assessment/Plan:  46 year old G7 P4 status post tubal sterilization and endometrial ablation with worsening dysmenorrhea. Ultrasound shows no evidence of hematometria but multiple small myomas. Options for management were reviewed and the patient wants to proceed with hysterectomy. Uterus is generous on ultrasound although grossly normal on bimanual. The plan will be for attempted [facility] with possible LAVH back up. Patient understands at any time during the procedure if it becomes unsafe to proceed with the vaginal approach or if complications arise I may convert this to a total abdominal hysterectomy and she understands and accepts this. The ovarian conservation issue was reviewed with her the options to remove both ovaries or  keep both ovaries was discussed. Patient understands that if her ovaries are removed she will have accelerated menopausal symptoms, accelerated cardiovascular risk and osteoporosis risk. By keeping her ovaries she will keep the risk of ovarian disease with possible treatments or surgeries in the future as well as the risk of ovarian cancer. After a lengthy discussion the patient wants to keep both ovaries and she accepts the risk of ovarian cancer and benign ovarian disease but she does give me permission to remove one or both ovaries at the time of surgery if significant disease is encountered and it is my best judgment to do so intraoperatively.  Absolute irreversible sterility associated with hysterectomy was reviewed. Sexuality following hysterectomy was also discussed and the risk of persistent orgasmic dysfunction and persistent dyspareunia was reviewed with her. The risk of infection requiring prolonged antibiotics as well as risk of abscess/hematoma formation requiring reoperation and drainage was reviewed with her. The risk of hemorrhage necessitating transfusion and the risks of transfusion discussed to include transfusion reaction, hepatitis, HIV, mad cow disease and other unknown entities. The risk of inadvertent injury to internal organs, either immediately recognized or delay recognized, including bowel, bladder, ureters, vessels and nerves necessitating major exploratory reparative surgeries and future reparative surgeries including bladder repair, bowel resection, ureteral damage repair and ostomy formation was all reviewed with her. The patient's questions were answered to her satisfaction she is ready to proceed with surgery.   Dara Lords MD, 10:08 AM 01/19/2012

## 2012-01-19 NOTE — Progress Notes (Signed)
Aniesha Haughn Weingarten Jan 08, 1966 [id number]   Preoperative consult    Chief complaint: dysmenorrhea, leiomyoma  History of present illness: 46 y.o.  G7 P4 status post tubal sterilization and ThermaChoice endometrial ablation with worsening unacceptable dysmenorrhea. Her menses are relatively light but she is having disabling cramping with them. Ultrasound showed multiple small myomas, no evidence of hematometria.  Options for management were reviewed and she elects for vaginal hysterectomy.   Past medical history,surgical history, medications, allergies, family history and social history were all reviewed and documented in the EPIC chart. ROS:  Was performed and pertinent positives and negatives are included in the history of present illness.  Exam: General: well developed, well nourished female, no acute distress HEENT: normal  Lungs: clear to auscultation without wheezing, rales or rhonchi  Cardiac: regular rate without rubs, murmurs or gallops  Abdomen: soft, nontender without masses, guarding, rebound, organomegaly  Pelvic: external bus vagina: normal   Cervix: grossly normal  Uterus: normal size, midline and mobile, nontender  Adnexa: without masses or tenderness     Assessment/Plan:  46 year old G7 P4 status post tubal sterilization and endometrial ablation with worsening dysmenorrhea. Ultrasound shows no evidence of hematometria but multiple small myomas. Options for management were reviewed and the patient wants to proceed with hysterectomy. Uterus is generous on ultrasound although grossly normal on bimanual. The plan will be for attempted [facility] with possible LAVH back up. Patient understands at any time during the procedure if it becomes unsafe to proceed with the vaginal approach or if complications arise I may convert this to a total abdominal hysterectomy and she understands and accepts this. The ovarian conservation issue was reviewed with her the options to remove both ovaries or  keep both ovaries was discussed. Patient understands that if her ovaries are removed she will have accelerated menopausal symptoms, accelerated cardiovascular risk and osteoporosis risk. By keeping her ovaries she will keep the risk of ovarian disease with possible treatments or surgeries in the future as well as the risk of ovarian cancer. After a lengthy discussion the patient wants to keep both ovaries and she accepts the risk of ovarian cancer and benign ovarian disease but she does give me permission to remove one or both ovaries at the time of surgery if significant disease is encountered and it is my best judgment to do so intraoperatively.  Absolute irreversible sterility associated with hysterectomy was reviewed. Sexuality following hysterectomy was also discussed and the risk of persistent orgasmic dysfunction and persistent dyspareunia was reviewed with her. The risk of infection requiring prolonged antibiotics as well as risk of abscess/hematoma formation requiring reoperation and drainage was reviewed with her. The risk of hemorrhage necessitating transfusion and the risks of transfusion discussed to include transfusion reaction, hepatitis, HIV, mad cow disease and other unknown entities. The risk of inadvertent injury to internal organs, either immediately recognized or delay recognized, including bowel, bladder, ureters, vessels and nerves necessitating major exploratory reparative surgeries and future reparative surgeries including bladder repair, bowel resection, ureteral damage repair and ostomy formation was all reviewed with her. The patient's questions were answered to her satisfaction she is ready to proceed with surgery.    Dara Lords MD, 9:49 AM 01/19/2012

## 2012-01-19 NOTE — Patient Instructions (Addendum)
Followup for surgery as scheduled. 

## 2012-01-24 MED ORDER — CEFOTETAN DISODIUM 1 G IJ SOLR
1.0000 g | INTRAMUSCULAR | Status: AC
  Administered 2012-01-25: 1 g via INTRAVENOUS
  Filled 2012-01-24: qty 1

## 2012-01-25 ENCOUNTER — Encounter (HOSPITAL_COMMUNITY)
Admission: RE | Disposition: A | Discharge status: Home / Self Care | Payer: Self-pay | Source: Ambulatory Visit | Attending: Gynecology

## 2012-01-25 ENCOUNTER — Encounter (HOSPITAL_COMMUNITY): Payer: Self-pay | Admitting: Anesthesiology

## 2012-01-25 ENCOUNTER — Ambulatory Visit (HOSPITAL_COMMUNITY): Payer: 59 | Admitting: Anesthesiology

## 2012-01-25 ENCOUNTER — Ambulatory Visit (HOSPITAL_COMMUNITY)
Admission: RE | Admit: 2012-01-25 | Discharge: 2012-01-26 | Disposition: A | Discharge status: Home / Self Care | Payer: 59 | Source: Ambulatory Visit | Attending: Gynecology | Admitting: Gynecology

## 2012-01-25 ENCOUNTER — Encounter (HOSPITAL_COMMUNITY): Payer: Self-pay | Admitting: *Deleted

## 2012-01-25 ENCOUNTER — Encounter (HOSPITAL_COMMUNITY): Payer: Self-pay | Admitting: Gynecology

## 2012-01-25 DIAGNOSIS — N946 Dysmenorrhea, unspecified: Secondary | ICD-10-CM | POA: Insufficient documentation

## 2012-01-25 DIAGNOSIS — Z9889 Other specified postprocedural states: Secondary | ICD-10-CM

## 2012-01-25 DIAGNOSIS — Z01812 Encounter for preprocedural laboratory examination: Secondary | ICD-10-CM | POA: Insufficient documentation

## 2012-01-25 DIAGNOSIS — Z01818 Encounter for other preprocedural examination: Secondary | ICD-10-CM | POA: Insufficient documentation

## 2012-01-25 DIAGNOSIS — D259 Leiomyoma of uterus, unspecified: Secondary | ICD-10-CM

## 2012-01-25 HISTORY — PX: VAGINAL HYSTERECTOMY: SHX2639

## 2012-01-25 SURGERY — HYSTERECTOMY, VAGINAL
Anesthesia: General | Site: Vagina | Wound class: Clean Contaminated

## 2012-01-25 MED ORDER — KETOROLAC TROMETHAMINE 30 MG/ML IJ SOLN
30.0000 mg | Freq: Four times a day (QID) | INTRAMUSCULAR | Status: DC
  Filled 2012-01-25: qty 1

## 2012-01-25 MED ORDER — KETOROLAC TROMETHAMINE 30 MG/ML IJ SOLN: 15.0000 mg | Freq: Once | INTRAMUSCULAR | Status: DC | PRN

## 2012-01-25 MED ORDER — DEXAMETHASONE SODIUM PHOSPHATE 10 MG/ML IJ SOLN
INTRAMUSCULAR | Status: DC | PRN
  Administered 2012-01-25: 10 mg via INTRAVENOUS

## 2012-01-25 MED ORDER — ONDANSETRON HCL 4 MG/2ML IJ SOLN
INTRAMUSCULAR | Status: DC | PRN
  Administered 2012-01-25: 4 mg via INTRAVENOUS

## 2012-01-25 MED ORDER — KETOROLAC TROMETHAMINE 30 MG/ML IJ SOLN
INTRAMUSCULAR | Status: AC
  Filled 2012-01-25: qty 1

## 2012-01-25 MED ORDER — HYDROMORPHONE HCL PF 1 MG/ML IJ SOLN
INTRAMUSCULAR | Status: AC
  Filled 2012-01-25: qty 1

## 2012-01-25 MED ORDER — PROMETHAZINE HCL 25 MG/ML IJ SOLN: 6.2500 mg | INTRAMUSCULAR | Status: DC | PRN

## 2012-01-25 MED ORDER — LIDOCAINE-EPINEPHRINE 1 %-1:100000 IJ SOLN
INTRAMUSCULAR | Status: DC | PRN
  Administered 2012-01-25: 10 mL

## 2012-01-25 MED ORDER — NEOSTIGMINE METHYLSULFATE 1 MG/ML IJ SOLN
INTRAMUSCULAR | Status: DC | PRN
  Administered 2012-01-25: 3 mg via INTRAVENOUS

## 2012-01-25 MED ORDER — PROPOFOL 10 MG/ML IV EMUL
INTRAVENOUS | Status: DC | PRN
  Administered 2012-01-25: 170 mg via INTRAVENOUS

## 2012-01-25 MED ORDER — LACTATED RINGERS IV SOLN
INTRAVENOUS | Status: DC
  Administered 2012-01-25 (×2): via INTRAVENOUS

## 2012-01-25 MED ORDER — MORPHINE SULFATE 4 MG/ML IJ SOLN
2.0000 mg | INTRAMUSCULAR | Status: DC | PRN
  Administered 2012-01-25 (×5): 2 mg via INTRAVENOUS
  Filled 2012-01-25 (×4): qty 1

## 2012-01-25 MED ORDER — LIDOCAINE HCL (CARDIAC) 20 MG/ML IV SOLN
INTRAVENOUS | Status: AC
  Filled 2012-01-25: qty 5

## 2012-01-25 MED ORDER — LIDOCAINE-EPINEPHRINE (PF) 1 %-1:200000 IJ SOLN
INTRAMUSCULAR | Status: AC
  Filled 2012-01-25: qty 10

## 2012-01-25 MED ORDER — MIDAZOLAM HCL 5 MG/5ML IJ SOLN
INTRAMUSCULAR | Status: DC | PRN
  Administered 2012-01-25: 2 mg via INTRAVENOUS

## 2012-01-25 MED ORDER — ROCURONIUM BROMIDE 100 MG/10ML IV SOLN
INTRAVENOUS | Status: DC | PRN
  Administered 2012-01-25: 50 mg via INTRAVENOUS

## 2012-01-25 MED ORDER — DIPHENHYDRAMINE HCL 25 MG PO CAPS: 50.0000 mg | ORAL_CAPSULE | Freq: Four times a day (QID) | ORAL | Status: DC | PRN

## 2012-01-25 MED ORDER — ROCURONIUM BROMIDE 50 MG/5ML IV SOLN
INTRAVENOUS | Status: AC
  Filled 2012-01-25: qty 1

## 2012-01-25 MED ORDER — GLYCOPYRROLATE 0.2 MG/ML IJ SOLN
INTRAMUSCULAR | Status: DC | PRN
  Administered 2012-01-25: 0.6 mg via INTRAVENOUS

## 2012-01-25 MED ORDER — LIDOCAINE HCL (CARDIAC) 20 MG/ML IV SOLN
INTRAVENOUS | Status: DC | PRN
  Administered 2012-01-25: 60 mg via INTRAVENOUS

## 2012-01-25 MED ORDER — MEPERIDINE HCL 25 MG/ML IJ SOLN: 6.2500 mg | INTRAMUSCULAR | Status: DC | PRN

## 2012-01-25 MED ORDER — DEXAMETHASONE SODIUM PHOSPHATE 10 MG/ML IJ SOLN
INTRAMUSCULAR | Status: AC
  Filled 2012-01-25: qty 1

## 2012-01-25 MED ORDER — DEXTROSE-NACL 5-0.9 % IV SOLN
INTRAVENOUS | Status: DC
  Administered 2012-01-25 – 2012-01-26 (×2): via INTRAVENOUS

## 2012-01-25 MED ORDER — PROPOFOL 10 MG/ML IV EMUL
INTRAVENOUS | Status: AC
  Filled 2012-01-25: qty 20

## 2012-01-25 MED ORDER — FENTANYL CITRATE 0.05 MG/ML IJ SOLN
INTRAMUSCULAR | Status: AC
  Filled 2012-01-25: qty 5

## 2012-01-25 MED ORDER — KETOROLAC TROMETHAMINE 30 MG/ML IJ SOLN
INTRAMUSCULAR | Status: DC | PRN
  Administered 2012-01-25: 30 mg via INTRAVENOUS

## 2012-01-25 MED ORDER — VASOPRESSIN 20 UNIT/ML IJ SOLN
INTRAMUSCULAR | Status: AC
  Filled 2012-01-25: qty 1

## 2012-01-25 MED ORDER — ONDANSETRON HCL 4 MG/2ML IJ SOLN
4.0000 mg | Freq: Four times a day (QID) | INTRAMUSCULAR | Status: DC | PRN
  Administered 2012-01-25: 4 mg via INTRAVENOUS
  Filled 2012-01-25: qty 2

## 2012-01-25 MED ORDER — ONDANSETRON HCL 4 MG/2ML IJ SOLN
INTRAMUSCULAR | Status: AC
  Filled 2012-01-25: qty 2

## 2012-01-25 MED ORDER — KETOROLAC TROMETHAMINE 30 MG/ML IJ SOLN
30.0000 mg | Freq: Four times a day (QID) | INTRAMUSCULAR | Status: DC
  Administered 2012-01-25 – 2012-01-26 (×4): 30 mg via INTRAVENOUS
  Filled 2012-01-25 (×3): qty 1

## 2012-01-25 MED ORDER — GLYCOPYRROLATE 0.2 MG/ML IJ SOLN
INTRAMUSCULAR | Status: AC
  Filled 2012-01-25: qty 2

## 2012-01-25 MED ORDER — DEXTROSE-NACL 5-0.9 % IV SOLN: INTRAVENOUS | Status: DC

## 2012-01-25 MED ORDER — MIDAZOLAM HCL 2 MG/2ML IJ SOLN
INTRAMUSCULAR | Status: AC
  Filled 2012-01-25: qty 2

## 2012-01-25 MED ORDER — HYDROMORPHONE HCL PF 1 MG/ML IJ SOLN
0.2500 mg | INTRAMUSCULAR | Status: DC | PRN
  Administered 2012-01-25 (×3): 0.5 mg via INTRAVENOUS

## 2012-01-25 MED ORDER — OXYCODONE-ACETAMINOPHEN 5-325 MG PO TABS
1.0000 | ORAL_TABLET | ORAL | Status: DC | PRN
  Administered 2012-01-26 (×2): 2 via ORAL
  Filled 2012-01-25 (×2): qty 2

## 2012-01-25 MED ORDER — BUPIVACAINE HCL (PF) 0.25 % IJ SOLN
INTRAMUSCULAR | Status: AC
  Filled 2012-01-25: qty 30

## 2012-01-25 MED ORDER — ONDANSETRON HCL 4 MG PO TABS: 4.0000 mg | ORAL_TABLET | Freq: Four times a day (QID) | ORAL | Status: DC | PRN

## 2012-01-25 MED ORDER — FENTANYL CITRATE 0.05 MG/ML IJ SOLN
INTRAMUSCULAR | Status: DC | PRN
  Administered 2012-01-25 (×2): 100 ug via INTRAVENOUS
  Administered 2012-01-25: 50 ug via INTRAVENOUS

## 2012-01-25 MED ORDER — NEOSTIGMINE METHYLSULFATE 1 MG/ML IJ SOLN
INTRAMUSCULAR | Status: AC
  Filled 2012-01-25: qty 10

## 2012-01-25 SURGICAL SUPPLY — 32 items
CANISTER SUCTION 2500CC (MISCELLANEOUS) ×2 IMPLANT
CLOTH BEACON ORANGE TIMEOUT ST (SAFETY) ×2 IMPLANT
CONT PATH 16OZ SNAP LID 3702 (MISCELLANEOUS) ×1 IMPLANT
CONTAINER PREFILL 10% NBF 60ML (FORM) IMPLANT
COVER TABLE BACK 60X90 (DRAPES) ×1 IMPLANT
DECANTER SPIKE VIAL GLASS SM (MISCELLANEOUS) ×1 IMPLANT
GLOVE BIO SURGEON STRL SZ7.5 (GLOVE) ×4 IMPLANT
GLOVE BIOGEL PI IND STRL 6.5 (GLOVE) ×1 IMPLANT
GLOVE BIOGEL PI INDICATOR 6.5 (GLOVE) ×1
GOWN PREVENTION PLUS LG XLONG (DISPOSABLE) ×8 IMPLANT
GOWN STRL REIN XL XLG (GOWN DISPOSABLE) ×2 IMPLANT
NDL SPNL 18GX3.5 QUINCKE PK (NEEDLE) ×1 IMPLANT
NDL SPNL 22GX3.5 QUINCKE BK (NEEDLE) IMPLANT
NEEDLE HYPO 22GX1.5 SAFETY (NEEDLE) IMPLANT
NEEDLE MAYO .5 CIRCLE (NEEDLE) IMPLANT
NEEDLE SPNL 18GX3.5 QUINCKE PK (NEEDLE) ×2 IMPLANT
NEEDLE SPNL 22GX3.5 QUINCKE BK (NEEDLE) ×2 IMPLANT
NS IRRIG 1000ML POUR BTL (IV SOLUTION) ×2 IMPLANT
PACK LAVH (CUSTOM PROCEDURE TRAY) ×1 IMPLANT
PACK VAGINAL WOMENS (CUSTOM PROCEDURE TRAY) ×1 IMPLANT
SUT VIC AB 0 CT1 18XCR BRD8 (SUTURE) ×3 IMPLANT
SUT VIC AB 0 CT1 27 (SUTURE) ×4
SUT VIC AB 0 CT1 27XBRD ANBCTR (SUTURE) ×1 IMPLANT
SUT VIC AB 0 CT1 36 (SUTURE) ×2 IMPLANT
SUT VIC AB 0 CT1 8-18 (SUTURE) ×6
SUT VIC AB 2-0 SH 27 (SUTURE) ×2
SUT VIC AB 2-0 SH 27XBRD (SUTURE) IMPLANT
SUT VICRYL 0 TIES 12 18 (SUTURE) ×2 IMPLANT
SYR BULB IRRIGATION 50ML (SYRINGE) ×1 IMPLANT
TOWEL OR 17X24 6PK STRL BLUE (TOWEL DISPOSABLE) ×4 IMPLANT
TRAY FOLEY CATH 14FR (SET/KITS/TRAYS/PACK) ×2 IMPLANT
WATER STERILE IRR 1000ML POUR (IV SOLUTION) ×2 IMPLANT

## 2012-01-25 NOTE — Anesthesia Preprocedure Evaluation (Signed)
Anesthesia Evaluation  Patient identified by MRN, date of birth, ID band Patient awake    Reviewed: Allergy & Precautions, H&P , NPO status , Patient's Chart, lab work & pertinent test results  Airway Mallampati: I TM Distance: >3 FB Neck ROM: full    Dental No notable dental hx. (+) Teeth Intact   Pulmonary neg pulmonary ROS,    Pulmonary exam normal       Cardiovascular negative cardio ROS      Neuro/Psych negative neurological ROS  negative psych ROS   GI/Hepatic negative GI ROS, Neg liver ROS,   Endo/Other  negative endocrine ROS  Renal/GU negative Renal ROS  negative genitourinary   Musculoskeletal negative musculoskeletal ROS (+)   Abdominal Normal abdominal exam  (+)   Peds negative pediatric ROS (+)  Hematology negative hematology ROS (+)   Anesthesia Other Findings   Reproductive/Obstetrics negative OB ROS                           Anesthesia Physical Anesthesia Plan  ASA: II  Anesthesia Plan: General   Post-op Pain Management:    Induction: Intravenous  Airway Management Planned: Oral ETT  Additional Equipment:   Intra-op Plan:   Post-operative Plan: Extubation in OR  Informed Consent: I have reviewed the patients History and Physical, chart, labs and discussed the procedure including the risks, benefits and alternatives for the proposed anesthesia with the patient or authorized representative who has indicated his/her understanding and acceptance.   Dental Advisory Given  Plan Discussed with: CRNA and Surgeon  Anesthesia Plan Comments:         Anesthesia Quick Evaluation  

## 2012-01-25 NOTE — Anesthesia Postprocedure Evaluation (Signed)
Anesthesia Post Note  Patient: [name]  Procedure(s) Performed: Procedure(s) (LRB): HYSTERECTOMY VAGINAL (N/A)  Anesthesia type: GA  Patient location: PACU  Post pain: Pain level controlled  Post assessment: Post-op Vital signs reviewed  Last Vitals:  Filed Vitals:   01/25/12 0945  BP: 114/74  Pulse: 66  Temp: 36.8 C  Resp: 16    Post vital signs: Reviewed  Level of consciousness: sedated  Complications: No apparent anesthesia complications

## 2012-01-25 NOTE — Transfer of Care (Signed)
Immediate Anesthesia Transfer of Care Note  Patient: [name]  Procedure(s) Performed: Procedure(s) (LRB): HYSTERECTOMY VAGINAL (N/A)  Patient Location: PACU  Anesthesia Type: General  Level of Consciousness: sedated  Airway & Oxygen Therapy: Patient Spontanous Breathing and Patient connected to nasal cannula oxygen  Post-op Assessment: Report given to PACU RN and Post -op Vital signs reviewed and stable  Post vital signs: Reviewed and stable  Complications: No apparent anesthesia complications

## 2012-01-25 NOTE — Addendum Note (Signed)
Addendum  created 01/25/12 1617 by Anyela Napierkowski M Stephon Weathers, RN   Modules edited:Notes Section    

## 2012-01-25 NOTE — Progress Notes (Signed)
DOS TVH  Awake. Alert Reports mild to moderate pain.  Has ambulated once.  Clear yellow urine in foley Abdomen soft with minimal pain.  A/P DOS S/P TVH doing well.  Continue routine PO care.  Tentative discharge in AM.

## 2012-01-25 NOTE — Op Note (Signed)
[name] March 31, 1966 [id number]   Post Operative Note   Date of surgery:  01/25/2012  Pre Op Dx: dysmenorrhea, leiomyoma  Post Op Dx:  same  Procedure:  Total vaginal hysterectomy  Surgeon:  Dara Lords  Assistant:  Reynaldo Minium  Anesthesia:  General  EBL:  75 cc  Intraoperative urine output: 300 cc  Complications:  None  Specimen:  Uterus, morcellator to pathology  Findings: EUA:  External BUS vagina normal. Cervix grossly normal. Uterus normal size midline mobile. Adnexa without masses   Operative:  Uterus mildly enlarged with several small myomas noted. Right and left fallopian tubes segments grossly normal. Right left ovaries grossly normal. Cul-de-sac grossly normal to limits to inspection.  Procedure:  Patient was taken to the operating room, underwent general anesthesia, placed in the dorsal lithotomy position, received a perineal/vaginal preparation with Betadine solution, EUA performed and a Foley catheter was placed in sterile technique. The patient was draped in usual fashion and the cervix was visualized with a weighted speculum, grasped with a tenaculum and the cervical mucosa was circumferentially injected using 1% lidocaine with epinephrine in 1-100,000 dilution, a total of 10 cc. The cervical mucosa was then circumferentially incised and the paracervical planes sharply developed. The posterior cul-de-sac was sharply entered without difficulty and a long weighted speculum was placed. The right and left uterosacral ligaments were identified, clamped cut and ligated using 0 Vicryl suture and tagged for future reference. The anterior cul-de-sac was then sharply developed and ultimately entered without difficulty and the uterus was progressively freed from its attachments through clamping, cutting and ligating of the cardinal ligaments and parametrial tissues. After identifying and ligating the uterine vessels bilaterally, it became evident that due to good  support from the uterine/ovarian ligaments, that morcellation was necessary. The uterus was then progressively morcellated to allow visualization of the uterine/ovarian pedicles and these were clamped cut and doubly ligated bilaterally using 0 Vicryl suture. The long weighted speculum was replaced with a shorter weighted speculum, the posterior vaginal cuff was grasped with an Allis clamp and the intestines were packed from the cul-de-sac using a tagged tail sponge. The posterior vaginal mucosa was then run from uterosacral ligament to uterosacral ligament using 0 Vicryl suture in a running interlocking stitch. The tail sponge was removed, the pelvis irrigated showing adequate hemostasis at all pedicles and the vagina was closed anterior to posterior using 0 Vicryl suture in interrupted figure-of-eight stitch. The vagina was irrigated again showing adequate hemostasis, the speculum removed, the patient placed in the supine position, received intraoperative Toradol, awakened without difficulty and was taken to the recovery room in good condition having tolerated the procedure well with free-flowing clear yellow urine.    Dara Lords MD, 8:56 AM 01/25/2012

## 2012-01-25 NOTE — H&P (Signed)
  The patient was examined.  I reviewed the proposed surgery and consent form with the patient.  The dictated history and physical is current and accurate and all questions were answered. The patient is ready to proceed with surgery and has a realistic understanding and expectation for the outcome.   Dara Lords MD, 7:05 AM 01/25/2012

## 2012-01-25 NOTE — Anesthesia Postprocedure Evaluation (Signed)
  Anesthesia Post-op Note  Patient: [name]  Procedure(s) Performed: Procedure(s) (LRB): HYSTERECTOMY VAGINAL (N/A)  Patient Location: PACU and Women's Unit  Anesthesia Type: General  Level of Consciousness: awake, alert  and oriented  Airway and Oxygen Therapy: Patient Spontanous Breathing  Post-op Pain: mild  Post-op Assessment: Post-op Vital signs reviewed  Post-op Vital Signs: Reviewed and stable  Complications: No apparent anesthesia complications

## 2012-01-26 ENCOUNTER — Encounter (HOSPITAL_COMMUNITY): Payer: Self-pay | Admitting: Gynecology

## 2012-01-26 LAB — CBC
MCH: 28.5 pg (ref 26.0–34.0)
MCHC: 33.5 g/dL (ref 30.0–36.0)
MCV: 85.1 fL (ref 78.0–100.0)
Platelets: 256 10*3/uL (ref 150–400)

## 2012-01-26 MED ORDER — OXYCODONE-ACETAMINOPHEN 5-325 MG PO TABS: 1.0000 | ORAL_TABLET | ORAL | Status: AC | PRN

## 2012-01-26 NOTE — Progress Notes (Signed)
Pt is discharged to home. Downstairs per ambulatory. Stable. Denies any pain or discomfort.Spirts are good.denies any pain or heavy vaginal bleeding. Stable.

## 2012-01-26 NOTE — Progress Notes (Signed)
Dyneisha Murchison Farfan 01-Aug-1966 [id number]   1 Day Post-Op Procedure(s) (LRB): HYSTERECTOMY VAGINAL (N/A)  Subjective: Patient reports feels well, no acute distress, pain severity reported mild, yes taking PO, foley catheter out, novoiding, yes ambulating, yespassing flatus  Objective: Afeb, VSS   EXAM General: awake, no distress Resp: rhonchi clear to auscultation bilaterally Cardio: regular rate and rhythm, S1, S2 normal, no murmur, click, rub or gallop GI: normal findings:soft, non-tender; bowel sounds normal; no masses,  no organomegaly Lower Extremities: Without swelling or tenderness Vaginal Bleeding: Reported scant  Assessment: s/p Procedure(s): HYSTERECTOMY VAGINAL: progressing well, ready for discharge.  Post op Hb 11.3. Ambulating. Eating. Foley out at 5 am, awaiting voiding.  Plan: Discharge home today after spontaneous voiding.  Precautions, instructions and follow up were discussed with the patient.  Prescriptions provided  per AVS.  Patient to call the office to arrange a post-operative appointmant in 2 weeks.  Dara Lords, MD 01/26/2012 6:48 AM

## 2012-01-26 NOTE — Discharge Instructions (Signed)
°  Postoperative Instructions Hysterectomy ° °Dr. Delray Reza and the nursing staff have discussed postoperative instructions with you.  If you have any questions please ask them before you leave the hospital, or call Dr Destina Mantei’s office at [phone].   ° °We would like to emphasize the following instructions: ° ° °  Call the office to make your follow-up appointment as recommended by Dr Miyu Fenderson (usually 2 weeks). ° °  You were given a prescription, or one was ordered for you at the pharmacy you designated.  Get that prescription filled and take the medication according to instructions. ° °  You may eat a regular diet, but slowly until you start having bowel movements. ° °  Drink plenty of water daily. ° °  Nothing in the vagina (intercourse, douching, objects of any kind) until released by Dr Margret Moat. ° °  No driving for two weeks.  Wait to be cleared by Dr Heddy Vidana at your first post op check.  Car rides (short) are ok after several days at home, as long as you are not having significant pain, but no traveling out of town. ° °  You may shower, but no baths.  Walking up and down stairs is ok.  No heavy lifting, prolonged standing, repeated bending or any “working out” until your first post op check. ° °  Rest frequently, listen to your body and do not push yourself and overdo it. ° °  Call if: ° °o Your pain medication does not seem strong enough. °o Worsening pain or abdominal bloating °o Persistent nausea or vomiting °o Difficulty with urination or bowel movements. °o Temperature of 101 degrees or higher. °o Bleeding heavier then staining (clots or period type flow). °o Incisions become red, tender or begin to drain. °o You have any questions or concerns. °

## 2012-01-27 NOTE — Discharge Summary (Signed)
[name] 06/28/1966 [id number]   Discharge Summary  Date of Admission:  01/25/2012  Date of Discharge:  01/26/2012  Discharge Diagnosis:  Dysmenorrhea, leiomyoma  Procedure:  Procedure(s): HYSTERECTOMY VAGINAL  Pathology:  #: [id number] Diagnosis Uterus and cervix UTERINE CORPUS: ENDOMETRIUM: - SECRETORY PHASE ENDOMETRIUM. - NO HYPERPLASIA, ATYPIA OR MALIGNANCY IDENTIFIED. MYOMETRIUM: - LEIOMYOMATA. - NO ATYPIA OR MALIGNANCY IDENTIFIED. UTERINE CERVIX: - BENIGN TRANSFORMATION ZONE MUCOSA. - NO DYSPLASIA, ATYPIA OR MALIGNANCY IDENTIFIED  Hospital Course:  Patient underwent an uncomplicated TVH 01/25/2012. Her postoperative course was uncomplicated and she was discharged on postoperative day #, eating, ambulating well, voiding without difficulty with a postoperative hemoglobin of 11.3. The patient received precautions, instructions and follow up and will be seen in the office 2 weeks following discharge. She was given pain medication per AVS.    Dara Lords MD, 10:01 AM 01/27/2012

## 2012-01-29 ENCOUNTER — Telehealth: Payer: Self-pay | Admitting: *Deleted

## 2012-01-29 MED ORDER — HYDROCODONE-ACETAMINOPHEN 5-325 MG PO TABS: 1.0000 | ORAL_TABLET | Freq: Four times a day (QID) | ORAL | Status: AC | PRN

## 2012-01-29 NOTE — Telephone Encounter (Signed)
rx called in to pharmacy. Pt informed as well.

## 2012-01-29 NOTE — Telephone Encounter (Signed)
Lortab 5.0 #20 one to 2 by mouth every 4-6 hours when necessary pain no refill to be called in.

## 2012-01-29 NOTE — Telephone Encounter (Signed)
PT HAD HYSTERECTOMY VAGINAL ON 01/25/12 AND WAS GIVEN PERCOCET 5-325.SHE HAS ONLY 5 PILLS LEFT AND WOULD LIKE TO HAVE MORE PAIN MEDICATION FOR THE WEEKEND DUE TO SURGERY PAIN. PLEASE ADVISE

## 2012-02-09 ENCOUNTER — Ambulatory Visit
Admission: RE | Admit: 2012-02-09 | Discharge: 2012-02-09 | Disposition: A | Discharge status: Home / Self Care | Payer: 59 | Source: Ambulatory Visit | Attending: Obstetrics and Gynecology | Admitting: Obstetrics and Gynecology

## 2012-02-09 DIAGNOSIS — Z1231 Encounter for screening mammogram for malignant neoplasm of breast: Secondary | ICD-10-CM

## 2012-02-10 ENCOUNTER — Ambulatory Visit: Payer: 59 | Admitting: Obstetrics and Gynecology

## 2012-02-10 ENCOUNTER — Encounter: Payer: Self-pay | Admitting: Gynecology

## 2012-02-10 ENCOUNTER — Ambulatory Visit (INDEPENDENT_AMBULATORY_CARE_PROVIDER_SITE_OTHER): Payer: 59 | Admitting: Gynecology

## 2012-02-10 DIAGNOSIS — Z9889 Other specified postprocedural states: Secondary | ICD-10-CM

## 2012-02-10 NOTE — Patient Instructions (Signed)
Followup in 2 weeks for your next postoperative checkup. 

## 2012-02-10 NOTE — Progress Notes (Signed)
Patient presents for two-week postoperative check up from her TVH. She's done well with minimal pelvic cramping.  Exam with Sherrilyn Rist chaperone present Pelvic external BUS vagina with cuff intact healing nicely. Bimanual without masses or tenderness  Assessment and plan: Post op check, 2 weeks status post TVH doing well. We'll slowly resume activities with the exception of continued pelvic rest and represent in 2 weeks for her next postoperative checkup.   Pathology was reviewed:  Accession #: [id number] Diagnosis Uterus and cervix 211 grams UTERINE CORPUS: ENDOMETRIUM: - SECRETORY PHASE ENDOMETRIUM. - NO HYPERPLASIA, ATYPIA OR MALIGNANCY IDENTIFIED. MYOMETRIUM: - LEIOMYOMATA. - NO ATYPIA OR MALIGNANCY IDENTIFIED. UTERINE CERVIX: - BENIGN TRANSFORMATION ZONE MUCOSA. - NO DYSPLASIA, ATYPIA OR MALIGNANCY IDENTIFIED.

## 2012-02-26 ENCOUNTER — Ambulatory Visit (INDEPENDENT_AMBULATORY_CARE_PROVIDER_SITE_OTHER): Payer: 59 | Admitting: Gynecology

## 2012-02-26 ENCOUNTER — Encounter: Payer: Self-pay | Admitting: Gynecology

## 2012-02-26 VITALS — BP 124/80

## 2012-02-26 DIAGNOSIS — Z9889 Other specified postprocedural states: Secondary | ICD-10-CM

## 2012-02-26 DIAGNOSIS — R300 Dysuria: Secondary | ICD-10-CM

## 2012-02-26 LAB — URINALYSIS W MICROSCOPIC + REFLEX CULTURE
Casts: NONE SEEN
Glucose, UA: NEGATIVE mg/dL
Ketones, ur: NEGATIVE mg/dL
Leukocytes, UA: NEGATIVE
Nitrite: NEGATIVE
Protein, ur: NEGATIVE mg/dL
pH: 5.5 (ref 5.0–8.0)

## 2012-02-26 NOTE — Progress Notes (Addendum)
Patient presents for her 4 week postoperative check status post TVH. Doing well. She does note some occasional end stream dysuria but not consistently.  Exam with Selena Batten chaperone present Abdomen soft nontender without masses guarding rebound organomegaly. Pelvic external BUS vagina with cuff healing nicely. Sutures still in place. Some vaginal spotting noted. Bimanual without masses or tenderness  Assessment and plan: Normal postop check. We'll resume all normal activities with the exception of continued pelvic rest. Patient will represent in 2 weeks for her final postop check. Her UA shows few bacteria has 7-10 RBCs 0-2 wbc's and a few epithelial. Will check culture and treat accordingly. I suspect the RBCs are secondary to her vaginal staining.

## 2012-02-26 NOTE — Patient Instructions (Signed)
Follow up in 2 weeks for post op check 

## 2012-02-27 LAB — URINE CULTURE
Colony Count: NO GROWTH
Organism ID, Bacteria: NO GROWTH

## 2012-03-10 ENCOUNTER — Ambulatory Visit: Payer: 59 | Admitting: Gynecology

## 2012-03-10 ENCOUNTER — Encounter: Payer: Self-pay | Admitting: Gynecology

## 2012-03-10 ENCOUNTER — Ambulatory Visit (INDEPENDENT_AMBULATORY_CARE_PROVIDER_SITE_OTHER): Payer: 59 | Admitting: Gynecology

## 2012-03-10 DIAGNOSIS — Z9889 Other specified postprocedural states: Secondary | ICD-10-CM

## 2012-03-10 NOTE — Progress Notes (Signed)
Patient presents postoperative from her [facility] 01/25/2012 doing well.  Exam was Sherrilyn Rist chaperone present Abdomen soft nontender without masses guarding rebound organomegaly. Pelvic external BUS vagina with small air granulation tissue a cuff. Silver nitrate applied. Bimanual without masses or tenderness.  Assessment and plan: 6 weeks postop status post TVH doing well. We'll slowly resume all normal activities with the exception of pelvic rest for 2 more weeks. Assuming she does well she'll see me in January 2014 for her annual exam, sooner as needed.

## 2012-03-10 NOTE — Patient Instructions (Signed)
Wait 2 more weeks for intercourse. Otherwise resume all normal activities. Follow up January 2014 for her annual GYN exam. Sooner if any issues.

## 2012-06-07 ENCOUNTER — Other Ambulatory Visit: Payer: Self-pay | Admitting: Family Medicine

## 2012-06-07 DIAGNOSIS — L0291 Cutaneous abscess, unspecified: Secondary | ICD-10-CM

## 2012-06-07 DIAGNOSIS — L0390 Cellulitis, unspecified: Secondary | ICD-10-CM

## 2012-06-10 ENCOUNTER — Ambulatory Visit
Admission: RE | Admit: 2012-06-10 | Discharge: 2012-06-10 | Disposition: A | Discharge status: Home / Self Care | Payer: 59 | Source: Ambulatory Visit | Attending: Family Medicine | Admitting: Family Medicine

## 2012-06-10 DIAGNOSIS — L0291 Cutaneous abscess, unspecified: Secondary | ICD-10-CM

## 2012-06-10 DIAGNOSIS — E049 Nontoxic goiter, unspecified: Secondary | ICD-10-CM

## 2013-01-27 ENCOUNTER — Other Ambulatory Visit: Payer: Self-pay

## 2013-01-27 DIAGNOSIS — Z1231 Encounter for screening mammogram for malignant neoplasm of breast: Secondary | ICD-10-CM

## 2013-02-08 ENCOUNTER — Encounter: Payer: Self-pay | Admitting: Women's Health

## 2013-02-08 ENCOUNTER — Ambulatory Visit (INDEPENDENT_AMBULATORY_CARE_PROVIDER_SITE_OTHER): Payer: 59 | Admitting: Women's Health

## 2013-02-08 VITALS — BP 122/62 | Ht 68.0 in | Wt 189.0 lb

## 2013-02-08 DIAGNOSIS — E039 Hypothyroidism, unspecified: Secondary | ICD-10-CM | POA: Insufficient documentation

## 2013-02-08 DIAGNOSIS — Z01419 Encounter for gynecological examination (general) (routine) without abnormal findings: Secondary | ICD-10-CM

## 2013-02-08 DIAGNOSIS — Z833 Family history of diabetes mellitus: Secondary | ICD-10-CM

## 2013-02-08 DIAGNOSIS — Z13220 Encounter for screening for lipoid disorders: Secondary | ICD-10-CM

## 2013-02-08 LAB — CBC WITH DIFFERENTIAL/PLATELET
Basophils Absolute: 0 10*3/uL (ref 0.0–0.1)
Basophils Relative: 0 % (ref 0–1)
Eosinophils Absolute: 0.1 10*3/uL (ref 0.0–0.7)
Hemoglobin: 13.3 g/dL (ref 12.0–15.0)
MCH: 28.7 pg (ref 26.0–34.0)
MCHC: 34.3 g/dL (ref 30.0–36.0)
Neutro Abs: 1.6 10*3/uL — ABNORMAL LOW (ref 1.7–7.7)
Neutrophils Relative %: 46 % (ref 43–77)
Platelets: 256 10*3/uL (ref 150–400)

## 2013-02-08 LAB — URINALYSIS W MICROSCOPIC + REFLEX CULTURE
Bilirubin Urine: NEGATIVE
Crystals: NONE SEEN
Glucose, UA: NEGATIVE mg/dL
Leukocytes, UA: NEGATIVE
Protein, ur: NEGATIVE mg/dL
Specific Gravity, Urine: 1.02 (ref 1.005–1.030)
pH: 5.5 (ref 5.0–8.0)

## 2013-02-08 LAB — GLUCOSE, RANDOM: Glucose, Bld: 111 mg/dL — ABNORMAL HIGH (ref 70–99)

## 2013-02-08 LAB — LIPID PANEL: Cholesterol: 153 mg/dL (ref 0–200)

## 2013-02-08 NOTE — Progress Notes (Signed)
[name] February 11, 1966 [id number]    History:    The patient presents for annual exam.  TVH 2013 with good relief of dysmenorrhea. Has lost 28 pounds with diet and exercise. Hypothyroid diagnosed approximately 6 months ago. History of a right breast cysts with a negative mammogram 01/2012.   Past medical history, past surgical history, family history and social history were all reviewed and documented in the EPIC chart. Homemaker, 4 children oldest 77, youngest 23. Mother deceased from lung, throat cancer, hypertension, father deceased throat cancer.   ROS:  A  ROS was performed and pertinent positives and negatives are included in the history.  Exam:  Filed Vitals:   02/08/13 0839  BP: 122/62    General appearance:  Normal Head/Neck:  Normal, without cervical or supraclavicular adenopathy. Thyroid:  Symmetrical, normal in size, without palpable masses or nodularity. Respiratory  Effort:  Normal  Auscultation:  Clear without wheezing or rhonchi Cardiovascular  Auscultation:  Regular rate, without rubs, murmurs or gallops  Edema/varicosities:  Not grossly evident Abdominal  Soft,nontender, without masses, guarding or rebound.  Liver/spleen:  No organomegaly noted  Hernia:  None appreciated  Skin  Inspection:  Grossly normal  Palpation:  Grossly normal Neurologic/psychiatric  Orientation:  Normal with appropriate conversation.  Mood/affect:  Normal  Genitourinary    Breasts: Examined lying and sitting.     Right: Without masses, retractions, discharge or axillary adenopathy.     Left: Without masses, retractions, discharge or axillary adenopathy.   Inguinal/mons:  Normal without inguinal adenopathy  External genitalia:  Normal  BUS/Urethra/Skene's glands:  Normal  Bladder:  Normal  Vagina:  Normal  Cervix:  absent  Uterus:  absent  Adnexa/parametria:     Rt: Without masses or tenderness.   Lt: Without masses or tenderness.  Anus and perineum: Normal  Digital  rectal exam: Normal sphincter tone without palpated masses or tenderness  Assessment/Plan:  47 y.o. MBF G7 P4 for annual exam with no complaints.  TVH 2013 with no menopausal symptoms Hypothyroid-primary care labs and meds  Plan: SBE's, continue annual mammogram, calcium rich diet, vitamin D 1000 daily encouraged. Reviewed importance of continuing healthy diet and exercise for continued weight loss. CBC, glucose, lipid panel, UA, Pap normal 2013, new screening guidelines reviewed.    Harrington Challenger WHNP, 1:11 PM 02/08/2013

## 2013-02-08 NOTE — Patient Instructions (Signed)

## 2013-02-09 ENCOUNTER — Other Ambulatory Visit: Payer: Self-pay

## 2013-02-09 ENCOUNTER — Ambulatory Visit
Admission: RE | Admit: 2013-02-09 | Discharge: 2013-02-09 | Disposition: A | Discharge status: Home / Self Care | Payer: 59 | Source: Ambulatory Visit

## 2013-02-09 DIAGNOSIS — Z1231 Encounter for screening mammogram for malignant neoplasm of breast: Secondary | ICD-10-CM

## 2013-02-13 ENCOUNTER — Other Ambulatory Visit: Payer: Self-pay | Admitting: *Deleted

## 2013-02-13 ENCOUNTER — Encounter: Payer: Self-pay | Admitting: *Deleted

## 2013-02-13 DIAGNOSIS — N63 Unspecified lump in unspecified breast: Secondary | ICD-10-CM

## 2013-02-24 ENCOUNTER — Ambulatory Visit
Admission: RE | Admit: 2013-02-24 | Discharge: 2013-02-24 | Disposition: A | Discharge status: Home / Self Care | Payer: 59 | Source: Ambulatory Visit | Attending: Gynecology | Admitting: Gynecology

## 2013-02-24 DIAGNOSIS — N63 Unspecified lump in unspecified breast: Secondary | ICD-10-CM

## 2013-06-12 ENCOUNTER — Ambulatory Visit: Payer: 59 | Admitting: Family Medicine

## 2014-05-31 ENCOUNTER — Other Ambulatory Visit: Payer: Self-pay

## 2014-05-31 DIAGNOSIS — Z1231 Encounter for screening mammogram for malignant neoplasm of breast: Secondary | ICD-10-CM

## 2014-06-04 ENCOUNTER — Ambulatory Visit
Admission: RE | Admit: 2014-06-04 | Discharge: 2014-06-04 | Disposition: A | Discharge status: Home / Self Care | Payer: 59 | Source: Ambulatory Visit

## 2014-06-04 DIAGNOSIS — Z1231 Encounter for screening mammogram for malignant neoplasm of breast: Secondary | ICD-10-CM

## 2014-08-27 ENCOUNTER — Encounter: Payer: Self-pay | Admitting: Women's Health

## 2014-10-15 ENCOUNTER — Emergency Department (HOSPITAL_COMMUNITY)
Admission: EM | Admit: 2014-10-15 | Discharge: 2014-10-15 | Disposition: A | Discharge status: Home / Self Care | Payer: 59 | Source: Home / Self Care | Attending: Emergency Medicine | Admitting: Emergency Medicine

## 2014-10-15 ENCOUNTER — Encounter (HOSPITAL_COMMUNITY): Payer: Self-pay | Admitting: Emergency Medicine

## 2014-10-15 DIAGNOSIS — J069 Acute upper respiratory infection, unspecified: Secondary | ICD-10-CM

## 2014-10-15 DIAGNOSIS — J209 Acute bronchitis, unspecified: Secondary | ICD-10-CM

## 2014-10-15 DIAGNOSIS — J019 Acute sinusitis, unspecified: Secondary | ICD-10-CM

## 2014-10-15 MED ORDER — FLUCONAZOLE 150 MG PO TABS: 150.0000 mg | ORAL_TABLET | Freq: Once | ORAL | Status: DC

## 2014-10-15 MED ORDER — PREDNISONE 20 MG PO TABS: 20.0000 mg | ORAL_TABLET | Freq: Two times a day (BID) | ORAL | Status: DC

## 2014-10-15 MED ORDER — AMOXICILLIN 500 MG PO CAPS: 1000.0000 mg | ORAL_CAPSULE | Freq: Three times a day (TID) | ORAL | Status: DC

## 2014-10-15 MED ORDER — FLUTICASONE PROPIONATE 50 MCG/ACT NA SUSP: 2.0000 | Freq: Every day | NASAL | Status: DC

## 2014-10-15 MED ORDER — HYDROCOD POLST-CHLORPHEN POLST 10-8 MG/5ML PO LQCR: 5.0000 mL | Freq: Two times a day (BID) | ORAL | Status: DC | PRN

## 2014-10-15 NOTE — ED Provider Notes (Signed)
   Chief Complaint   Nasal Congestion; Cough; and Sore Throat   History of Present Illness   [name] is a 48 year old female who has had a three-week history of cough productive of small amounts of yellow sputum, nasal congestion with clear to yellow rhinorrhea, headache, sinus pressure, ear condition, and sore throat. She has not had a fever. She denies any wheezing, chest tightness, or chest pain. She denies any GI symptoms. No known sick exposures.  Review of Systems   Other than as noted above, the patient denies any of the following symptoms: Systemic:  No fevers, chills, sweats, or myalgias. Eye:  No redness or discharge. ENT:  No ear pain, headache, nasal congestion, drainage, sinus pressure, or sore throat. Neck:  No neck pain, stiffness, or swollen glands. Lungs:  No cough, sputum production, hemoptysis, wheezing, chest tightness, shortness of breath or chest pain. GI:  No abdominal pain, nausea, vomiting or diarrhea.  [facility]   Past medical history, family history, social history, meds, and allergies were reviewed.   Physical exam   Vital signs:  BP 119/77 mmHg  Pulse 80  Temp(Src) 97.4 F (36.3 C) (Oral)  Resp 20  SpO2 98%  LMP 01/03/2012 General:  Alert and oriented.  In no distress.  Skin warm and dry. Eye:  No conjunctival injection or drainage. Lids were normal. ENT:  TMs and canals were normal, without erythema or inflammation.  Nasal mucosa was congested, without drainage.  Mucous membranes were moist.  Pharynx was slightly erythematous with cobblestoning.  There were no oral ulcerations or lesions. Neck:  Supple, no adenopathy, tenderness or mass. Lungs:  No respiratory distress.  Lungs were clear to auscultation, without wheezes, rales or rhonchi.  Breath sounds were clear and equal bilaterally.  Heart:  Regular rhythm, without gallops, murmers or rubs. Skin:  Clear, warm, and dry, without rash or lesions.   Assessment     The primary encounter  diagnosis was Viral URI. Diagnoses of Acute sinusitis, recurrence not specified, unspecified location and Acute bronchitis, unspecified organism were also pertinent to this visit.  Plan    1.  Meds:  The following meds were prescribed:   New Prescriptions   AMOXICILLIN (AMOXIL) 500 MG CAPSULE    Take 2 capsules (1,000 mg total) by mouth 3 (three) times daily.   CHLORPHENIRAMINE-HYDROCODONE (TUSSIONEX) 10-8 MG/5ML LQCR    Take 5 mLs by mouth every 12 (twelve) hours as needed for cough.   FLUTICASONE (FLONASE) 50 MCG/ACT NASAL SPRAY    Place 2 sprays into both nostrils daily.   PREDNISONE (DELTASONE) 20 MG TABLET    Take 1 tablet (20 mg total) by mouth 2 (two) times daily.    2.  Patient Education/Counseling:  The patient was given appropriate handouts, self care instructions, and instructed in symptomatic relief.  Instructed to get extra fluids and extra rest.    3.  Follow up:  The patient was told to follow up here if no better in one week, or sooner if becoming worse in any way, and given some red flag symptoms such as increasing fever, difficulty breathing, chest pain, or persistent vomiting which would prompt immediate return.       Harden Mo, MD 10/15/14 [phone]

## 2014-10-15 NOTE — ED Notes (Signed)
Pt has been suffering from URI symptoms for three weeks.  Denies any fever.

## 2014-10-15 NOTE — Discharge Instructions (Signed)

## 2014-11-27 ENCOUNTER — Ambulatory Visit (INDEPENDENT_AMBULATORY_CARE_PROVIDER_SITE_OTHER): Payer: 59 | Admitting: Women's Health

## 2014-11-27 ENCOUNTER — Encounter: Payer: Self-pay | Admitting: Women's Health

## 2014-11-27 VITALS — BP 120/80 | Ht 68.0 in | Wt 222.0 lb

## 2014-11-27 DIAGNOSIS — Z01419 Encounter for gynecological examination (general) (routine) without abnormal findings: Secondary | ICD-10-CM

## 2014-11-27 NOTE — Progress Notes (Signed)
[name] [birth year] [id number]    History:    Presents for annual exam.  2013 TVH for dysmenorrhea. Hypothyroid had labs today at primary care. Normal Pap and mammogram history. Torn knee cartilage, repair 05/2014. Had lost greater than 20 pounds last year with diet and exercise has regained.  Past medical history, past surgical history, family history and social history were all reviewed and documented in the EPIC chart. 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 Mother died of lung cancer with metastasis and hypertension. Father died of throat cancer.  ROS:  A ROS was performed and pertinent positives and negatives are included.  Exam:  Filed Vitals:   11/27/14 1018  BP: 120/80    General appearance:  Normal Thyroid:  Symmetrical, normal in size, without palpable masses or nodularity. Respiratory  Auscultation:  Clear without wheezing or rhonchi Cardiovascular  Auscultation:  Regular rate, without rubs, murmurs or gallops  Edema/varicosities:  Not grossly evident Abdominal  Soft,nontender, without masses, guarding or rebound.  Liver/spleen:  No organomegaly noted  Hernia:  None appreciated  Skin  Inspection:  Grossly normal   Breasts: Examined lying and sitting.     Right: Without masses, retractions, discharge or axillary adenopathy.     Left: Without masses, retractions, discharge or axillary adenopathy. Gentitourinary   Inguinal/mons:  Normal without inguinal adenopathy  External genitalia:  Normal  BUS/Urethra/Skene's glands:  Normal  Vagina:  Normal  Cervix:  And Uterus absent  Adnexa/parametria:     Rt: Without masses or tenderness.   Lt: Without masses or tenderness.  Anus and perineum: Normal  Digital rectal exam: Normal sphincter tone without palpated masses or tenderness  Assessment/Plan:  49 y.o. MBF G4P4 for annual exam with no complaints.  2013 TVH for dysmenorrhea with no menopausal symptoms Hypothyroid-primary care Obesity  Plan: SBE's,  continue annual mammogram, increase regular exercise and decrease calories for weight loss, calcium rich diet, vitamin D 1000 daily encouraged. UA, Pap screening guidelines reviewed. 3 children have received gardasil encouraged her to get 48 year old to receive.  Huel Cote WHNP, 11:08 AM 11/27/2014

## 2014-11-27 NOTE — Patient Instructions (Signed)

## 2014-11-28 LAB — URINALYSIS W MICROSCOPIC + REFLEX CULTURE
BILIRUBIN URINE: NEGATIVE
Bacteria, UA: NONE SEEN
CASTS: NONE SEEN
Crystals: NONE SEEN
Glucose, UA: NEGATIVE mg/dL
HGB URINE DIPSTICK: NEGATIVE
KETONES UR: NEGATIVE mg/dL
Leukocytes, UA: NEGATIVE
Nitrite: NEGATIVE
PROTEIN: NEGATIVE mg/dL
Specific Gravity, Urine: 1.03 (ref 1.005–1.030)
UROBILINOGEN UA: 0.2 mg/dL (ref 0.0–1.0)
pH: 5.5 (ref 5.0–8.0)

## 2015-04-25 ENCOUNTER — Other Ambulatory Visit: Payer: Self-pay | Admitting: Surgical

## 2015-05-15 ENCOUNTER — Encounter (HOSPITAL_BASED_OUTPATIENT_CLINIC_OR_DEPARTMENT_OTHER): Payer: Self-pay | Admitting: *Deleted

## 2015-05-16 ENCOUNTER — Encounter (HOSPITAL_BASED_OUTPATIENT_CLINIC_OR_DEPARTMENT_OTHER): Payer: Self-pay | Admitting: *Deleted

## 2015-05-16 NOTE — Progress Notes (Addendum)
NPO AFTER MN.  ARRIVE AT 0600. NEEDS HG.  LM FOR AMBER CONSTABLE PA,(YESTERDAY) FOR DR GIOFFRE, QUESTION AND VERIFY IF CBC, CMET, PT/INR NEEDED FOR THIS PT SINCE IT IS JUST AN ARTHROSCOPY. SINCE NORMALLY LABS PER ANESTHESIA IS ORDERED FOR KNEE SCOPES.  I HAVE NOT HEARD BACK FROM HER OR ANYONE.  SO, LM WITH WENDI , OR SCHEDULER. FOR DR GIOFFRE,  TODAY TO HELP GET IN CONTACT W/ EITHER AMBER OR DR GIOFFRE ABOUT ORDERS.   RECEIVED CALL FROM OFFICE, PER DR GIOFFRE D/C LAB ORDERS AND ADD LABS PER ANESTHESIA.

## 2015-05-17 ENCOUNTER — Encounter (HOSPITAL_BASED_OUTPATIENT_CLINIC_OR_DEPARTMENT_OTHER): Payer: Self-pay | Admitting: *Deleted

## 2015-05-17 ENCOUNTER — Ambulatory Visit (HOSPITAL_BASED_OUTPATIENT_CLINIC_OR_DEPARTMENT_OTHER): Payer: 59 | Admitting: Anesthesiology

## 2015-05-17 ENCOUNTER — Ambulatory Visit (HOSPITAL_BASED_OUTPATIENT_CLINIC_OR_DEPARTMENT_OTHER)
Admission: RE | Admit: 2015-05-17 | Discharge: 2015-05-17 | Disposition: A | Discharge status: Home / Self Care | Payer: 59 | Source: Ambulatory Visit | Attending: Orthopedic Surgery | Admitting: Orthopedic Surgery

## 2015-05-17 ENCOUNTER — Encounter (HOSPITAL_BASED_OUTPATIENT_CLINIC_OR_DEPARTMENT_OTHER)
Admission: RE | Disposition: A | Discharge status: Home / Self Care | Payer: Self-pay | Source: Ambulatory Visit | Attending: Orthopedic Surgery

## 2015-05-17 DIAGNOSIS — S83242A Other tear of medial meniscus, current injury, left knee, initial encounter: Secondary | ICD-10-CM | POA: Diagnosis not present

## 2015-05-17 DIAGNOSIS — Y999 Unspecified external cause status: Secondary | ICD-10-CM | POA: Diagnosis not present

## 2015-05-17 DIAGNOSIS — Y929 Unspecified place or not applicable: Secondary | ICD-10-CM | POA: Insufficient documentation

## 2015-05-17 DIAGNOSIS — Y939 Activity, unspecified: Secondary | ICD-10-CM | POA: Insufficient documentation

## 2015-05-17 DIAGNOSIS — M25562 Pain in left knee: Secondary | ICD-10-CM | POA: Diagnosis present

## 2015-05-17 DIAGNOSIS — E039 Hypothyroidism, unspecified: Secondary | ICD-10-CM | POA: Insufficient documentation

## 2015-05-17 DIAGNOSIS — M2242 Chondromalacia patellae, left knee: Secondary | ICD-10-CM | POA: Diagnosis not present

## 2015-05-17 DIAGNOSIS — X58XXXA Exposure to other specified factors, initial encounter: Secondary | ICD-10-CM | POA: Insufficient documentation

## 2015-05-17 HISTORY — DX: Personal history of other endocrine, nutritional and metabolic disease: Z86.39

## 2015-05-17 HISTORY — DX: Unspecified tear of unspecified meniscus, current injury, left knee, initial encounter: S83.207A

## 2015-05-17 HISTORY — PX: KNEE ARTHROSCOPY WITH MEDIAL MENISECTOMY: SHX5651

## 2015-05-17 SURGERY — ARTHROSCOPY, KNEE, WITH MEDIAL MENISCECTOMY
Anesthesia: General | Site: Knee | Laterality: Left

## 2015-05-17 MED ORDER — METHOCARBAMOL 500 MG PO TABS
500.0000 mg | ORAL_TABLET | Freq: Once | ORAL | Status: AC
  Administered 2015-05-17: 500 mg via ORAL
  Filled 2015-05-17: qty 1

## 2015-05-17 MED ORDER — OXYCODONE-ACETAMINOPHEN 10-325 MG PO TABS: 1.0000 | ORAL_TABLET | ORAL | Status: DC | PRN

## 2015-05-17 MED ORDER — FENTANYL CITRATE (PF) 100 MCG/2ML IJ SOLN
INTRAMUSCULAR | Status: AC
  Filled 2015-05-17: qty 4

## 2015-05-17 MED ORDER — PROMETHAZINE HCL 25 MG/ML IJ SOLN
6.2500 mg | INTRAMUSCULAR | Status: DC | PRN
  Filled 2015-05-17: qty 1

## 2015-05-17 MED ORDER — FENTANYL CITRATE (PF) 100 MCG/2ML IJ SOLN
INTRAMUSCULAR | Status: AC
  Filled 2015-05-17: qty 2

## 2015-05-17 MED ORDER — OXYCODONE HCL 5 MG PO TABS
10.0000 mg | ORAL_TABLET | Freq: Once | ORAL | Status: AC
  Administered 2015-05-17: 10 mg via ORAL
  Filled 2015-05-17: qty 2

## 2015-05-17 MED ORDER — DEXAMETHASONE SODIUM PHOSPHATE 4 MG/ML IJ SOLN
INTRAMUSCULAR | Status: DC | PRN
  Administered 2015-05-17: 10 mg via INTRAVENOUS

## 2015-05-17 MED ORDER — BACITRACIN-NEOMYCIN-POLYMYXIN 400-5-5000 EX OINT
TOPICAL_OINTMENT | CUTANEOUS | Status: DC | PRN
  Administered 2015-05-17: 1 via TOPICAL

## 2015-05-17 MED ORDER — FENTANYL CITRATE (PF) 100 MCG/2ML IJ SOLN
25.0000 ug | INTRAMUSCULAR | Status: DC | PRN
  Administered 2015-05-17 (×3): 50 ug via INTRAVENOUS
  Filled 2015-05-17: qty 1

## 2015-05-17 MED ORDER — SODIUM CHLORIDE 0.9 % IR SOLN
Status: DC | PRN
  Administered 2015-05-17 (×2): 3000 mL

## 2015-05-17 MED ORDER — LACTATED RINGERS IV SOLN
INTRAVENOUS | Status: DC
  Administered 2015-05-17: 07:00:00 via INTRAVENOUS
  Filled 2015-05-17: qty 1000

## 2015-05-17 MED ORDER — LIDOCAINE HCL (CARDIAC) 20 MG/ML IV SOLN
INTRAVENOUS | Status: DC | PRN
  Administered 2015-05-17: 80 mg via INTRAVENOUS

## 2015-05-17 MED ORDER — PROPOFOL 10 MG/ML IV BOLUS
INTRAVENOUS | Status: DC | PRN
  Administered 2015-05-17: 200 mg via INTRAVENOUS

## 2015-05-17 MED ORDER — KETOROLAC TROMETHAMINE 30 MG/ML IJ SOLN
INTRAMUSCULAR | Status: AC
  Filled 2015-05-17: qty 1

## 2015-05-17 MED ORDER — METHOCARBAMOL 500 MG PO TABS
ORAL_TABLET | ORAL | Status: AC
  Filled 2015-05-17: qty 1

## 2015-05-17 MED ORDER — CEFAZOLIN SODIUM-DEXTROSE 2-3 GM-% IV SOLR
2.0000 g | INTRAVENOUS | Status: AC
  Administered 2015-05-17: 2 g via INTRAVENOUS
  Filled 2015-05-17: qty 50

## 2015-05-17 MED ORDER — OXYCODONE HCL 5 MG PO TABS
ORAL_TABLET | ORAL | Status: AC
  Filled 2015-05-17: qty 2

## 2015-05-17 MED ORDER — BUPIVACAINE-EPINEPHRINE 0.25% -1:200000 IJ SOLN
INTRAMUSCULAR | Status: DC | PRN
  Administered 2015-05-17: 30 mL

## 2015-05-17 MED ORDER — MIDAZOLAM HCL 2 MG/2ML IJ SOLN
INTRAMUSCULAR | Status: AC
  Filled 2015-05-17: qty 2

## 2015-05-17 MED ORDER — FENTANYL CITRATE (PF) 100 MCG/2ML IJ SOLN
INTRAMUSCULAR | Status: DC | PRN
  Administered 2015-05-17: 25 ug via INTRAVENOUS
  Administered 2015-05-17 (×2): 50 ug via INTRAVENOUS
  Administered 2015-05-17: 25 ug via INTRAVENOUS

## 2015-05-17 MED ORDER — KETOROLAC TROMETHAMINE 30 MG/ML IJ SOLN
30.0000 mg | Freq: Once | INTRAMUSCULAR | Status: AC | PRN
  Administered 2015-05-17: 30 mg via INTRAVENOUS
  Filled 2015-05-17: qty 1

## 2015-05-17 MED ORDER — LACTATED RINGERS IV SOLN
INTRAVENOUS | Status: DC
  Filled 2015-05-17: qty 1000

## 2015-05-17 MED ORDER — CEFAZOLIN SODIUM-DEXTROSE 2-3 GM-% IV SOLR
INTRAVENOUS | Status: AC
  Filled 2015-05-17: qty 50

## 2015-05-17 MED ORDER — ONDANSETRON HCL 4 MG/2ML IJ SOLN
INTRAMUSCULAR | Status: DC | PRN
  Administered 2015-05-17: 4 mg via INTRAVENOUS

## 2015-05-17 MED ORDER — MIDAZOLAM HCL 5 MG/5ML IJ SOLN
INTRAMUSCULAR | Status: DC | PRN
  Administered 2015-05-17: 2 mg via INTRAVENOUS

## 2015-05-17 MED ORDER — ACETAMINOPHEN 10 MG/ML IV SOLN
INTRAVENOUS | Status: DC | PRN
  Administered 2015-05-17: 1000 mg via INTRAVENOUS

## 2015-05-17 SURGICAL SUPPLY — 31 items
BANDAGE ELASTIC 4 VELCRO ST LF (GAUZE/BANDAGES/DRESSINGS) ×2 IMPLANT
BANDAGE ELASTIC 6 VELCRO ST LF (GAUZE/BANDAGES/DRESSINGS) ×2 IMPLANT
BNDG COHESIVE 6X5 TAN NS LF (GAUZE/BANDAGES/DRESSINGS) ×2 IMPLANT
CLOTH BEACON ORANGE TIMEOUT ST (SAFETY) ×2 IMPLANT
DRAPE ARTHROSCOPY W/POUCH 114 (DRAPES) ×2 IMPLANT
DRAPE LG THREE QUARTER DISP (DRAPES) ×2 IMPLANT
DRSG EMULSION OIL 3X3 NADH (GAUZE/BANDAGES/DRESSINGS) ×2 IMPLANT
DURAPREP 26ML APPLICATOR (WOUND CARE) ×2 IMPLANT
GLOVE BIOGEL PI IND STRL 7.5 (GLOVE) IMPLANT
GLOVE BIOGEL PI INDICATOR 7.5 (GLOVE) ×1
GLOVE ECLIPSE 7.5 STRL STRAW (GLOVE) ×1 IMPLANT
GLOVE ECLIPSE 8.0 STRL XLNG CF (GLOVE) ×3 IMPLANT
GLOVE INDICATOR 7.5 STRL GRN (GLOVE) ×1 IMPLANT
GLOVE INDICATOR 8.5 STRL (GLOVE) ×3 IMPLANT
GOWN STRL REUS W/ TWL LRG LVL3 (GOWN DISPOSABLE) ×1 IMPLANT
GOWN STRL REUS W/ TWL XL LVL3 (GOWN DISPOSABLE) ×1 IMPLANT
GOWN STRL REUS W/TWL LRG LVL3 (GOWN DISPOSABLE) ×2
GOWN STRL REUS W/TWL XL LVL3 (GOWN DISPOSABLE) ×2
KNEE WRAP E Z 3 GEL PACK (MISCELLANEOUS) ×2 IMPLANT
MANIFOLD NEPTUNE II (INSTRUMENTS) ×1 IMPLANT
PACK ARTHROSCOPY DSU (CUSTOM PROCEDURE TRAY) ×2 IMPLANT
PACK BASIN DAY SURGERY FS (CUSTOM PROCEDURE TRAY) ×2 IMPLANT
PAD ABD 8X10 STRL (GAUZE/BANDAGES/DRESSINGS) ×6 IMPLANT
PADDING CAST COTTON 6X4 STRL (CAST SUPPLIES) ×2 IMPLANT
SET ARTHROSCOPY TUBING (MISCELLANEOUS) ×2
SET ARTHROSCOPY TUBING LN (MISCELLANEOUS) ×1 IMPLANT
SPONGE GAUZE 4X4 12PLY STER LF (GAUZE/BANDAGES/DRESSINGS) ×1 IMPLANT
SUT ETHILON 3 0 PS 1 (SUTURE) ×2 IMPLANT
TOWEL OR 17X24 6PK STRL BLUE (TOWEL DISPOSABLE) ×2 IMPLANT
WAND 90 DEG TURBOVAC W/CORD (SURGICAL WAND) ×3 IMPLANT
WATER STERILE IRR 500ML POUR (IV SOLUTION) ×2 IMPLANT

## 2015-05-17 NOTE — Interval H&P Note (Signed)
History and Physical Interval Note:  05/17/2015 7:16 AM  [name]  has presented today for surgery, with the diagnosis of LEFT KNEE MEDIAL MENISCUS TEAR  The various methods of treatment have been discussed with the patient and family. After consideration of risks, benefits and other options for treatment, the patient has consented to  Procedure(s): LEFT KNEE ARTHROSCOPY WITH MEDIAL MENISECTOMY (Left) as a surgical intervention .  The patient's history has been reviewed, patient examined, no change in status, stable for surgery.  I have reviewed the patient's chart and labs.  Questions were answered to the patient's satisfaction.     Jenascia Bumpass A

## 2015-05-17 NOTE — Op Note (Signed)
NAMEDANGELA, HOW              ACCOUNT NO.:  [id number]  MEDICAL RECORD NO.:  [id number]  LOCATION:                                 FACILITY:  PHYSICIAN:  Kipp Brood. Kassim Guertin, M.D.DATE OF BIRTH:  02/02/2066  DATE OF PROCEDURE:  05/17/2015 DATE OF DISCHARGE:                              OPERATIVE REPORT   SURGEON:  Kipp Brood. Avonna Iribe, M.D.  ASSISTANT:  OR Nurse.  PREOPERATIVE DIAGNOSES: 1. Chondromalacia patella, left patella. 2. Tear of the posterior horn, medial meniscus, left knee.  POSTOPERATIVE DIAGNOSES: 1. Chondromalacia patella, left patella. 2. Tear of the posterior horn, medial meniscus, left knee.  OPERATION: 1. Diagnostic arthroscopy, left knee. 2. Partial medial meniscectomy, posterior horn, left knee medial     meniscus. 3. Abrasion chondroplasty of the patella, left knee.  DESCRIPTION OF PROCEDURE:  Under general anesthesia, the patient's left lower extremity in knee holder.  The appropriate time-out was first carried out.  I also marked the appropriate left leg in the holding area.  At this time, after sterile prep and draping and time-out, a small punctate incision was made in suprapatellar pouch.  The patient had 1 g of IV Ancef.  At this particular time, the inflow cannula was inserted in the suprapatellar region.  Knee was __________ with saline. Another small punctate incision was made in the anterolateral joint. The arthroscope was entered from the lateral approach.  At this time, I did a complete diagnostic arthroscopy up in the patellofemoral joint. She had obvious chondromalacia of her patella.  I utilized the [employer] and did an abrasion chondroplasty down to bleeding bone.  I then went down and explored the lateral joint.  Lateral side of the joint looked fine.  Lateral meniscus was intact.  Created a crossed over the midline.  The cruciates were intact.  Medial joint, the only main pathology was tear of the posterior horn of the medial  meniscus.  I simply utilized the [employer] and did a partial medial meniscectomy.  I then probed the remaining part of meniscus that was intact.  I thoroughly irrigated the knee, removed all the fluid, closed all 3 punctate incisions with 3- 0 nylon suture.  I injected 30 mL of 0.25% Marcaine and epinephrine in the knee joint, and a sterile Neosporin and bundle dressing was applied. The patient left the operating room in satisfactory condition.          ______________________________ Kipp Brood Gladstone Lighter, M.D.     RAG/MEDQ  D:  05/17/2015  T:  05/17/2015  Job:  [id number]

## 2015-05-17 NOTE — Anesthesia Procedure Notes (Signed)
Procedure Name: LMA Insertion Date/Time: 05/17/2015 7:31 AM Performed by: Mechele Claude Pre-anesthesia Checklist: Patient identified, Emergency Drugs available, Suction available and Patient being monitored Patient Re-evaluated:Patient Re-evaluated prior to inductionOxygen Delivery Method: Circle System Utilized Preoxygenation: Pre-oxygenation with 100% oxygen Intubation Type: IV induction Ventilation: Mask ventilation without difficulty LMA: LMA inserted LMA Size: 4.0 Number of attempts: 1 Airway Equipment and Method: bite block Placement Confirmation: positive ETCO2 Tube secured with: Tape Dental Injury: Teeth and Oropharynx as per pre-operative assessment

## 2015-05-17 NOTE — Anesthesia Postprocedure Evaluation (Signed)
  Anesthesia Post-op Note  Patient: [name]  Procedure(s) Performed: Procedure(s) (LRB): LEFT KNEE ARTHROSCOPY WITH MEDIAL MENISECTOMY, CHONDROPLASTY PATELLA (Left)  Patient Location: PACU  Anesthesia Type: General  Level of Consciousness: awake and alert   Airway and Oxygen Therapy: Patient Spontanous Breathing  Post-op Pain: mild  Post-op Assessment: Post-op Vital signs reviewed, Patient's Cardiovascular Status Stable, Respiratory Function Stable, Patent Airway and No signs of Nausea or vomiting  Last Vitals:  Filed Vitals:   05/17/15 0845  BP: 121/85  Pulse: 65  Temp:   Resp: 12    Post-op Vital Signs: stable   Complications: No apparent anesthesia complications

## 2015-05-17 NOTE — H&P (Signed)
[name] is an 49 y.o. female.   Chief Complaint: Pain and catching in Left Knee HPI: She developed pain,swelling and catching in her Left Knee.  Past Medical History  Diagnosis Date  . Acute meniscal tear of left knee   . History of hypothyroidism     Past Surgical History  Procedure Laterality Date  . Vaginal hysterectomy  01/25/2012    Procedure: HYSTERECTOMY VAGINAL;  Surgeon: Anastasio Auerbach, MD;  Location: [facility];  Service: Gynecology;  Laterality: N/A;  . Hysteroscopy w/ thermachoice endometrial ablation  03-16-2007  . Laparosocpy bilateral tubal ligation w/ cauterization  10-15-2000    Family History  Problem Relation Age of Onset  . Hypertension Mother   . Cancer Mother     LUG, THROAT, & BACK  . Cancer Father     THROAT  . Cancer Maternal Grandmother     KIDNEY  . Cancer Maternal Grandfather     COLON   Social History:  reports that she has never smoked. She has never used smokeless tobacco. She reports that she does not drink alcohol or use illicit drugs.  Allergies:  Allergies  Allergen Reactions  . Adhesive [Tape] Rash    No prescriptions prior to admission    No results found for this or any previous visit (from the past 65 hour(s)). No results found.  Review of Systems  Constitutional: Negative.   HENT: Negative.   Eyes: Negative.   Respiratory: Negative.   Cardiovascular: Negative.   Gastrointestinal: Negative.   Genitourinary: Negative.   Musculoskeletal: Positive for joint pain.  Skin: Negative.   Neurological: Negative.   Endo/Heme/Allergies: Negative.   Psychiatric/Behavioral: Negative.     Blood pressure 113/72, pulse 72, temperature 98.5 F (36.9 C), temperature source Oral, resp. rate 16, height 5\' 8"  (1.727 m), weight 100.245 kg (221 lb), last menstrual period 01/03/2012, SpO2 100 %. Physical Exam  Constitutional: She appears well-developed.  HENT:  Head: Normocephalic.  Eyes: Pupils are equal, round, and reactive to  light.  Neck: Normal range of motion.  Cardiovascular: Normal rate.   Respiratory: Effort normal.  GI: Soft.  Musculoskeletal:  Pain and catching in Left Knee  Neurological: She is alert.  Skin: Skin is warm.  Psychiatric: She has a normal mood and affect.     Assessment/Plan Arthroscopic medial Menisectomy of Left Knee.  Tresten Pantoja A 05/17/2015, 7:12 AM

## 2015-05-17 NOTE — Brief Op Note (Signed)
05/17/2015  8:12 AM  PATIENT:  [name]  49 y.o. female  PRE-OPERATIVE DIAGNOSIS:  LEFT KNEE MEDIAL MENISCUS TEAR and Chondroplasty of Patella.  POST-OPERATIVE DIAGNOSIS:  LEFT KNEE MEDIAL MENISCUS TEAR  PROCEDURE:  Procedure(s): LEFT KNEE ARTHROSCOPY WITH MEDIAL MENISECTOMY (Left) and Abrasion Chondroplasty of Patella.  SURGEON:  Surgeon(s) and Role:    * Latanya Maudlin, MD - Primary     ASSISTANTS:OR Nurse  ANESTHESIA:   general  EBL:  Total I/O In: 400 [I.V.:400] Out: -   BLOOD ADMINISTERED:none  DRAINS: none   LOCAL MEDICATIONS USED:  MARCAINE 30cc of 0.25% with Epinephrine.    SPECIMEN:  No Specimen  DISPOSITION OF SPECIMEN:  N/A  COUNTS:  YES  TOURNIQUET:  * No tourniquets in log *  DICTATION: .Other Dictation: Dictation Number [id number]  PLAN OF CARE: Discharge to home after PACU  PATIENT DISPOSITION:  PACU - hemodynamically stable.   Delay start of Pharmacological VTE agent (>24hrs) due to surgical blood loss or risk of bleeding: yes

## 2015-05-17 NOTE — Anesthesia Preprocedure Evaluation (Signed)
Anesthesia Evaluation  Patient identified by MRN, date of birth, ID band Patient awake    Reviewed: Allergy & Precautions, NPO status , Patient's Chart, lab work & pertinent test results  Airway Mallampati: II  TM Distance: >3 FB Neck ROM: Full    Dental no notable dental hx.    Pulmonary neg pulmonary ROS,  breath sounds clear to auscultation  Pulmonary exam normal       Cardiovascular negative cardio ROS Normal cardiovascular examRhythm:Regular Rate:Normal     Neuro/Psych negative neurological ROS  negative psych ROS   GI/Hepatic negative GI ROS, Neg liver ROS,   Endo/Other  negative endocrine ROS  Renal/GU negative Renal ROS  negative genitourinary   Musculoskeletal negative musculoskeletal ROS (+)   Abdominal   Peds negative pediatric ROS (+)  Hematology negative hematology ROS (+)   Anesthesia Other Findings   Reproductive/Obstetrics negative OB ROS                             Anesthesia Physical Anesthesia Plan  ASA: I  Anesthesia Plan: General   Post-op Pain Management:    Induction: Intravenous  Airway Management Planned: LMA  Additional Equipment:   Intra-op Plan:   Post-operative Plan: Extubation in OR  Informed Consent: I have reviewed the patients History and Physical, chart, labs and discussed the procedure including the risks, benefits and alternatives for the proposed anesthesia with the patient or authorized representative who has indicated his/her understanding and acceptance.   Dental advisory given  Plan Discussed with: CRNA and Surgeon  Anesthesia Plan Comments:         Anesthesia Quick Evaluation

## 2015-05-17 NOTE — Transfer of Care (Signed)
Last Vitals:  Filed Vitals:   05/17/15 0618  BP: 113/72  Pulse: 72  Temp: 36.9 C  Resp: 16    Immediate Anesthesia Transfer of Care Note  Patient: [name]  Procedure(s) Performed: Procedure(s) (LRB): LEFT KNEE ARTHROSCOPY WITH MEDIAL MENISECTOMY, CHONDROPLASTY PATELLA (Left)  Patient Location: PACU  Anesthesia Type: General  Level of Consciousness: awake, alert  and oriented  Airway & Oxygen Therapy: Patient Spontanous Breathing and Patient connected to face mask oxygen  Post-op Assessment: Report given to PACU RN and Post -op Vital signs reviewed and stable  Post vital signs: Reviewed and stable  Complications: No apparent anesthesia complications

## 2015-05-17 NOTE — Discharge Instructions (Signed)

## 2015-05-20 ENCOUNTER — Encounter (HOSPITAL_BASED_OUTPATIENT_CLINIC_OR_DEPARTMENT_OTHER): Payer: Self-pay | Admitting: Orthopedic Surgery

## 2015-05-23 LAB — POCT HEMOGLOBIN-HEMACUE: Hemoglobin: 13.5 g/dL (ref 12.0–15.0)

## 2015-05-27 ENCOUNTER — Other Ambulatory Visit (HOSPITAL_COMMUNITY): Payer: Self-pay | Admitting: Orthopedic Surgery

## 2015-05-27 DIAGNOSIS — M7989 Other specified soft tissue disorders: Secondary | ICD-10-CM

## 2015-05-28 ENCOUNTER — Ambulatory Visit (HOSPITAL_COMMUNITY)
Admission: RE | Admit: 2015-05-28 | Discharge: 2015-05-28 | Disposition: A | Discharge status: Home / Self Care | Payer: 59 | Source: Ambulatory Visit | Attending: Cardiovascular Disease | Admitting: Cardiovascular Disease

## 2015-05-28 DIAGNOSIS — E669 Obesity, unspecified: Secondary | ICD-10-CM | POA: Diagnosis not present

## 2015-05-28 DIAGNOSIS — Z9889 Other specified postprocedural states: Secondary | ICD-10-CM | POA: Insufficient documentation

## 2015-05-28 DIAGNOSIS — M7989 Other specified soft tissue disorders: Secondary | ICD-10-CM | POA: Insufficient documentation

## 2016-04-17 ENCOUNTER — Other Ambulatory Visit: Payer: Self-pay | Admitting: Women's Health

## 2016-04-17 DIAGNOSIS — Z1231 Encounter for screening mammogram for malignant neoplasm of breast: Secondary | ICD-10-CM

## 2016-04-27 ENCOUNTER — Ambulatory Visit: Payer: 59

## 2016-05-04 ENCOUNTER — Ambulatory Visit: Payer: 59

## 2016-05-11 ENCOUNTER — Ambulatory Visit
Admission: RE | Admit: 2016-05-11 | Discharge: 2016-05-11 | Disposition: A | Discharge status: Home / Self Care | Payer: Commercial Managed Care - HMO | Source: Ambulatory Visit | Attending: Women's Health | Admitting: Women's Health

## 2016-05-11 DIAGNOSIS — Z1231 Encounter for screening mammogram for malignant neoplasm of breast: Secondary | ICD-10-CM

## 2016-05-19 ENCOUNTER — Encounter: Payer: Self-pay | Admitting: Women's Health

## 2016-05-19 ENCOUNTER — Ambulatory Visit (INDEPENDENT_AMBULATORY_CARE_PROVIDER_SITE_OTHER): Payer: Commercial Managed Care - HMO | Admitting: Women's Health

## 2016-05-19 ENCOUNTER — Other Ambulatory Visit: Payer: Self-pay | Admitting: Women's Health

## 2016-05-19 VITALS — BP 120/82 | Ht 68.0 in | Wt 208.0 lb

## 2016-05-19 DIAGNOSIS — Z01419 Encounter for gynecological examination (general) (routine) without abnormal findings: Secondary | ICD-10-CM | POA: Diagnosis not present

## 2016-05-19 DIAGNOSIS — B373 Candidiasis of vulva and vagina: Secondary | ICD-10-CM | POA: Diagnosis not present

## 2016-05-19 DIAGNOSIS — Z13220 Encounter for screening for lipoid disorders: Secondary | ICD-10-CM

## 2016-05-19 DIAGNOSIS — Z1329 Encounter for screening for other suspected endocrine disorder: Secondary | ICD-10-CM

## 2016-05-19 DIAGNOSIS — B3731 Acute candidiasis of vulva and vagina: Secondary | ICD-10-CM

## 2016-05-19 LAB — CBC WITH DIFFERENTIAL/PLATELET
BASOS PCT: 1 %
Basophils Absolute: 37 cells/uL (ref 0–200)
EOS ABS: 74 {cells}/uL (ref 15–500)
Eosinophils Relative: 2 %
HCT: 37.3 % (ref 35.0–45.0)
Hemoglobin: 12.5 g/dL (ref 11.7–15.5)
LYMPHS PCT: 42 %
Lymphs Abs: 1554 cells/uL (ref 850–3900)
MCH: 28.4 pg (ref 27.0–33.0)
MCHC: 33.5 g/dL (ref 32.0–36.0)
MCV: 84.8 fL (ref 80.0–100.0)
MONO ABS: 148 {cells}/uL — AB (ref 200–950)
MONOS PCT: 4 %
MPV: 10.4 fL (ref 7.5–12.5)
NEUTROS ABS: 1887 {cells}/uL (ref 1500–7800)
Neutrophils Relative %: 51 %
PLATELETS: 255 10*3/uL (ref 140–400)
RBC: 4.4 MIL/uL (ref 3.80–5.10)
RDW: 13.8 % (ref 11.0–15.0)
WBC: 3.7 10*3/uL — ABNORMAL LOW (ref 3.8–10.8)

## 2016-05-19 LAB — COMPREHENSIVE METABOLIC PANEL
ALT: 15 U/L (ref 6–29)
AST: 16 U/L (ref 10–35)
Albumin: 4 g/dL (ref 3.6–5.1)
Alkaline Phosphatase: 60 U/L (ref 33–115)
BUN: 13 mg/dL (ref 7–25)
CALCIUM: 9.2 mg/dL (ref 8.6–10.2)
CHLORIDE: 103 mmol/L (ref 98–110)
CO2: 26 mmol/L (ref 20–31)
Creat: 0.99 mg/dL (ref 0.50–1.10)
Glucose, Bld: 110 mg/dL — ABNORMAL HIGH (ref 65–99)
Potassium: 4.4 mmol/L (ref 3.5–5.3)
Sodium: 138 mmol/L (ref 135–146)
TOTAL PROTEIN: 6.6 g/dL (ref 6.1–8.1)
Total Bilirubin: 0.5 mg/dL (ref 0.2–1.2)

## 2016-05-19 LAB — T3 UPTAKE: T3 Uptake: 30 % (ref 22–35)

## 2016-05-19 LAB — LIPID PANEL
Cholesterol: 159 mg/dL (ref 125–200)
HDL: 50 mg/dL (ref >=46)
LDL Cholesterol: 95 mg/dL (ref <130)
TRIGLYCERIDES: 71 mg/dL (ref <150)
Total CHOL/HDL Ratio: 3.2 Ratio (ref <=5.0)
VLDL: 14 mg/dL (ref <30)

## 2016-05-19 LAB — TSH: TSH: 2.58 m[IU]/L

## 2016-05-19 LAB — T4: T4 TOTAL: 7.8 ug/dL (ref 4.5–12.0)

## 2016-05-19 MED ORDER — FLUCONAZOLE 150 MG PO TABS: 150.0000 mg | ORAL_TABLET | Freq: Once | ORAL | 1 refills | Status: AC

## 2016-05-19 NOTE — Progress Notes (Signed)
Reiya Pemble Meinecke 1965-11-05 [id number]    History:    Presents for annual exam.  2013 TVH  for dysmenorrhea. Having minimal menopausal symptoms. Normal Pap and mammogram history. Hypothyroidism managed by primary care.  Past medical history, past surgical history, family history and social history were all reviewed and documented in the EPIC chart. Homemaker, has 2 in college, one a senior in high school all doing well. Mother died of lung cancer. Father died of throat cancer.  ROS:  A ROS was performed and pertinent positives and negatives are included.  Exam:  Vitals:   05/19/16 0859  BP: 120/82    General appearance:  Normal Thyroid:  Symmetrical, normal in size, without palpable masses or nodularity. Respiratory  Auscultation:  Clear without wheezing or rhonchi Cardiovascular  Auscultation:  Regular rate, without rubs, murmurs or gallops  Edema/varicosities:  Not grossly evident Abdominal  Soft,nontender, without masses, guarding or rebound.  Liver/spleen:  No organomegaly noted  Hernia:  None appreciated  Skin  Inspection:  Grossly normal   Breasts: Examined lying and sitting.     Right: Without masses, retractions, discharge or axillary adenopathy.     Left: Without masses, retractions, discharge or axillary adenopathy. Gentitourinary   Inguinal/mons:  Normal without inguinal adenopathy  External genitalia:  Normal  BUS/Urethra/Skene's glands:  Normal  Vagina:  Normal  Cervix:  And uterus absent  Adnexa/parametria:     Rt: Without masses or tenderness.   Lt: Without masses or tenderness.  Anus and perineum: Normal  Digital rectal exam: Normal sphincter tone without palpated masses or tenderness  Assessment/Plan:  50 y.o. MBF G4 P4  for annual exam with no complaints.  2013 TVH for dysmenorrhea minimal menopausal symptoms Hypothyroidism-primary care manages labs and meds  Plan: Congratulated on weight loss continue healthy lifestyle. SBE's, annual screening  mammogram, 3-D tomography reviewed and encouraged history of dense breast. Continue regular exercise, calcium rich diet, vitamin D 1000 daily encouraged. Instructed to have vitamin D level checked with next lab draw. Recently completed 10 day antibiotics for sinus and URI, prescription for Diflucan 150 mg 1 dose given. UAHuel Cote WHNP, 1:41 PM 05/19/2016

## 2016-05-19 NOTE — Patient Instructions (Addendum)
Basic Carbohydrate Counting for Diabetes Mellitus Carbohydrate counting is a method for keeping track of the amount of carbohydrates you eat. Eating carbohydrates naturally increases the level of sugar (glucose) in your blood, so it is important for you to know the amount that is okay for you to have in every meal. Carbohydrate counting helps keep the level of glucose in your blood within normal limits. The amount of carbohydrates allowed is different for every person. A dietitian can help you calculate the amount that is right for you. Once you know the amount of carbohydrates you can have, you can count the carbohydrates in the foods you want to eat. Carbohydrates are found in the following foods:  Grains, such as breads and cereals.  Dried beans and soy products.  Starchy vegetables, such as potatoes, peas, and corn.  Fruit and fruit juices.  Milk and yogurt.  Sweets and snack foods, such as cake, cookies, candy, chips, soft drinks, and fruit drinks. CARBOHYDRATE COUNTING There are two ways to count the carbohydrates in your food. You can use either of the methods or a combination of both. Reading the "Nutrition Facts" on [place] The "Nutrition Facts" is an area that is included on the labels of almost all packaged food and beverages in the [place]. It includes the serving size of that food or beverage and information about the nutrients in each serving of the food, including the grams (g) of carbohydrate per serving.  Decide the number of servings of this food or beverage that you will be able to eat or drink. Multiply that number of servings by the number of grams of carbohydrate that is listed on the label for that serving. The total will be the amount of carbohydrates you will be having when you eat or drink this food or beverage. Learning Standard Serving Sizes of Food When you eat food that is not packaged or does not include "Nutrition Facts" on the label, you need to  measure the servings in order to count the amount of carbohydrates.A serving of most carbohydrate-rich foods contains about 15 g of carbohydrates. The following list includes serving sizes of carbohydrate-rich foods that provide 15 g ofcarbohydrate per serving:   1 slice of bread (1 oz) or 1 six-inch tortilla.    of a hamburger bun or English muffin.  4-6 crackers.   cup unsweetened dry cereal.    cup hot cereal.   cup rice or pasta.    cup mashed potatoes or  of a large baked potato.  1 cup fresh fruit or one small piece of fruit.    cup canned or frozen fruit or fruit juice.  1 cup milk.   cup plain fat-free yogurt or yogurt sweetened with artificial sweeteners.   cup cooked dried beans or starchy vegetable, such as peas, corn, or potatoes.  Decide the number of standard-size servings that you will eat. Multiply that number of servings by 15 (the grams of carbohydrates in that serving). For example, if you eat 2 cups of strawberries, you will have eaten 2 servings and 30 g of carbohydrates (2 servings x 15 g = 30 g). For foods such as soups and casseroles, in which more than one food is mixed in, you will need to count the carbohydrates in each food that is included. EXAMPLE OF CARBOHYDRATE COUNTING Sample Dinner  3 oz chicken breast.   cup of brown rice.   cup of corn.  1 cup milk.   1 cup strawberries with  sugar-free whipped topping.  Carbohydrate Calculation Step 1: Identify the foods that contain carbohydrates:   Rice.   Corn.   Milk.   Strawberries. Step 2:Calculate the number of servings eaten of each:   2 servings of rice.   1 serving of corn.   1 serving of milk.   1 serving of strawberries. Step 3: Multiply each of those number of servings by 15 g:   2 servings of rice x 15 g = 30 g.   1 serving of corn x 15 g = 15 g.   1 serving of milk x 15 g = 15 g.   1 serving of strawberries x 15 g = 15 g. Step 4: Add  together all of the amounts to find the total grams of carbohydrates eaten: 30 g + 15 g + 15 g + 15 g = 75 g.   This information is not intended to replace advice given to you by your health care provider. Make sure you discuss any questions you have with your health care provider.   Document Released: 10/12/2005 Document Revised: 11/02/2014 Document Reviewed: 09/08/2013 Elsevier Interactive Patient Education 2016 Heath Maintenance, Female Adopting a healthy lifestyle and getting preventive care can go a long way to promote health and wellness. Talk with your health care provider about what schedule of regular examinations is right for you. This is a good chance for you to check in with your provider about disease prevention and staying healthy. In between checkups, there are plenty of things you can do on your own. Experts have done a lot of research about which lifestyle changes and preventive measures are most likely to keep you healthy. Ask your health care provider for more information. WEIGHT AND DIET  Eat a healthy diet  Be sure to include plenty of vegetables, fruits, low-fat dairy products, and lean protein.  Do not eat a lot of foods high in solid fats, added sugars, or salt.  Get regular exercise. This is one of the most important things you can do for your health.  Most adults should exercise for at least 150 minutes each week. The exercise should increase your heart rate and make you sweat (moderate-intensity exercise).  Most adults should also do strengthening exercises at least twice a week. This is in addition to the moderate-intensity exercise.  Maintain a healthy weight  Body mass index (BMI) is a measurement that can be used to identify possible weight problems. It estimates body fat based on height and weight. Your health care provider can help determine your BMI and help you achieve or maintain a healthy weight.  For females 87 years of age and older:    A BMI below 18.5 is considered underweight.  A BMI of 18.5 to 24.9 is normal.  A BMI of 25 to 29.9 is considered overweight.  A BMI of 30 and above is considered obese.  Watch levels of cholesterol and blood lipids  You should start having your blood tested for lipids and cholesterol at 49 years of age, then have this test every 5 years.  You may need to have your cholesterol levels checked more often if:  Your lipid or cholesterol levels are high.  You are older than 49 years of age.  You are at high risk for heart disease.  CANCER SCREENING   Lung Cancer  Lung cancer screening is recommended for adults 12-76 years old who are at high risk for lung cancer because of a history of  smoking.  A yearly low-dose CT scan of the lungs is recommended for people who:  Currently smoke.  Have quit within the past 15 years.  Have at least a 30-pack-year history of smoking. A pack year is smoking an average of one pack of cigarettes a day for 1 year.  Yearly screening should continue until it has been 15 years since you quit.  Yearly screening should stop if you develop a health problem that would prevent you from having lung cancer treatment.  Breast Cancer  Practice breast self-awareness. This means understanding how your breasts normally appear and feel.  It also means doing regular breast self-exams. Let your health care provider know about any changes, no matter how small.  If you are in your 20s or 30s, you should have a clinical breast exam (CBE) by a health care provider every 1-3 years as part of a regular health exam.  If you are 66 or older, have a CBE every year. Also consider having a breast X-ray (mammogram) every year.  If you have a family history of breast cancer, talk to your health care provider about genetic screening.  If you are at high risk for breast cancer, talk to your health care provider about having an MRI and a mammogram every year.  Breast  cancer gene (BRCA) assessment is recommended for women who have family members with BRCA-related cancers. BRCA-related cancers include:  Breast.  Ovarian.  Tubal.  Peritoneal cancers.  Results of the assessment will determine the need for genetic counseling and BRCA1 and BRCA2 testing. Cervical Cancer Your health care provider may recommend that you be screened regularly for cancer of the pelvic organs (ovaries, uterus, and vagina). This screening involves a pelvic examination, including checking for microscopic changes to the surface of your cervix (Pap test). You may be encouraged to have this screening done every 3 years, beginning at age 24.  For women ages 17-65, health care providers may recommend pelvic exams and Pap testing every 3 years, or they may recommend the Pap and pelvic exam, combined with testing for human papilloma virus (HPV), every 5 years. Some types of HPV increase your risk of cervical cancer. Testing for HPV may also be done on women of any age with unclear Pap test results.  Other health care providers may not recommend any screening for nonpregnant women who are considered low risk for pelvic cancer and who do not have symptoms. Ask your health care provider if a screening pelvic exam is right for you.  If you have had past treatment for cervical cancer or a condition that could lead to cancer, you need Pap tests and screening for cancer for at least 20 years after your treatment. If Pap tests have been discontinued, your risk factors (such as having a new sexual partner) need to be reassessed to determine if screening should resume. Some women have medical problems that increase the chance of getting cervical cancer. In these cases, your health care provider may recommend more frequent screening and Pap tests. Colorectal Cancer  This type of cancer can be detected and often prevented.  Routine colorectal cancer screening usually begins at 49 years of age and  continues through 49 years of age.  Your health care provider may recommend screening at an earlier age if you have risk factors for colon cancer.  Your health care provider may also recommend using home test kits to check for hidden blood in the stool.  A small camera at the end  of a tube can be used to examine your colon directly (sigmoidoscopy or colonoscopy). This is done to check for the earliest forms of colorectal cancer.  Routine screening usually begins at age 73.  Direct examination of the colon should be repeated every 5-10 years through 49 years of age. However, you may need to be screened more often if early forms of precancerous polyps or small growths are found. Skin Cancer  Check your skin from head to toe regularly.  Tell your health care provider about any new moles or changes in moles, especially if there is a change in a mole's shape or color.  Also tell your health care provider if you have a mole that is larger than the size of a pencil eraser.  Always use sunscreen. Apply sunscreen liberally and repeatedly throughout the day.  Protect yourself by wearing long sleeves, pants, a wide-brimmed hat, and sunglasses whenever you are outside. HEART DISEASE, DIABETES, AND HIGH BLOOD PRESSURE   High blood pressure causes heart disease and increases the risk of stroke. High blood pressure is more likely to develop in:  People who have blood pressure in the high end of the normal range (130-139/85-89 mm Hg).  People who are overweight or obese.  People who are African American.  If you are 32-91 years of age, have your blood pressure checked every 3-5 years. If you are 62 years of age or older, have your blood pressure checked every year. You should have your blood pressure measured twice--once when you are at a hospital or clinic, and once when you are not at a hospital or clinic. Record the average of the two measurements. To check your blood pressure when you are not at  a hospital or clinic, you can use:  An automated blood pressure machine at a pharmacy.  A home blood pressure monitor.  If you are between 64 years and 17 years old, ask your health care provider if you should take aspirin to prevent strokes.  Have regular diabetes screenings. This involves taking a blood sample to check your fasting blood sugar level.  If you are at a normal weight and have a low risk for diabetes, have this test once every three years after 49 years of age.  If you are overweight and have a high risk for diabetes, consider being tested at a younger age or more often. PREVENTING INFECTION  Hepatitis B  If you have a higher risk for hepatitis B, you should be screened for this virus. You are considered at high risk for hepatitis B if:  You were born in a country where hepatitis B is common. Ask your health care provider which countries are considered high risk.  Your parents were born in a high-risk country, and you have not been immunized against hepatitis B (hepatitis B vaccine).  You have HIV or AIDS.  You use needles to inject street drugs.  You live with someone who has hepatitis B.  You have had sex with someone who has hepatitis B.  You get hemodialysis treatment.  You take certain medicines for conditions, including cancer, organ transplantation, and autoimmune conditions. Hepatitis C  Blood testing is recommended for:  Everyone born from [birth year] through 1964.  Anyone with known risk factors for hepatitis C. Sexually transmitted infections (STIs)  You should be screened for sexually transmitted infections (STIs) including gonorrhea and chlamydia if:  You are sexually active and are younger than 49 years of age.  You are older than 49  years of age and your health care provider tells you that you are at risk for this type of infection.  Your sexual activity has changed since you were last screened and you are at an increased risk for chlamydia or  gonorrhea. Ask your health care provider if you are at risk.  If you do not have HIV, but are at risk, it may be recommended that you take a prescription medicine daily to prevent HIV infection. This is called pre-exposure prophylaxis (PrEP). You are considered at risk if:  You are sexually active and do not regularly use condoms or know the HIV status of your partner(s).  You take drugs by injection.  You are sexually active with a partner who has HIV. Talk with your health care provider about whether you are at high risk of being infected with HIV. If you choose to begin PrEP, you should first be tested for HIV. You should then be tested every 3 months for as long as you are taking PrEP.  PREGNANCY   If you are premenopausal and you may become pregnant, ask your health care provider about preconception counseling.  If you may become pregnant, take 400 to 800 micrograms (mcg) of folic acid every day.  If you want to prevent pregnancy, talk to your health care provider about birth control (contraception). OSTEOPOROSIS AND MENOPAUSE   Osteoporosis is a disease in which the bones lose minerals and strength with aging. This can result in serious bone fractures. Your risk for osteoporosis can be identified using a bone density scan.  If you are 60 years of age or older, or if you are at risk for osteoporosis and fractures, ask your health care provider if you should be screened.  Ask your health care provider whether you should take a calcium or vitamin D supplement to lower your risk for osteoporosis.  Menopause may have certain physical symptoms and risks.  Hormone replacement therapy may reduce some of these symptoms and risks. Talk to your health care provider about whether hormone replacement therapy is right for you.  HOME CARE INSTRUCTIONS   Schedule regular health, dental, and eye exams.  Stay current with your immunizations.   Do not use any tobacco products including  cigarettes, chewing tobacco, or electronic cigarettes.  If you are pregnant, do not drink alcohol.  If you are breastfeeding, limit how much and how often you drink alcohol.  Limit alcohol intake to no more than 1 drink per day for nonpregnant women. One drink equals 12 ounces of beer, 5 ounces of wine, or 1 ounces of hard liquor.  Do not use street drugs.  Do not share needles.  Ask your health care provider for help if you need support or information about quitting drugs.  Tell your health care provider if you often feel depressed.  Tell your health care provider if you have ever been abused or do not feel safe at home.   This information is not intended to replace advice given to you by your health care provider. Make sure you discuss any questions you have with your health care provider.   Document Released: 04/27/2011 Document Revised: 11/02/2014 Document Reviewed: 09/13/2013 Elsevier Interactive Patient Education 2016 [place] Maintenance, Female Adopting a healthy lifestyle and getting preventive care can go a long way to promote health and wellness. Talk with your health care provider about what schedule of regular examinations is right for you. This is a good chance for you to check in with  your provider about disease prevention and staying healthy. In between checkups, there are plenty of things you can do on your own. Experts have done a lot of research about which lifestyle changes and preventive measures are most likely to keep you healthy. Ask your health care provider for more information. WEIGHT AND DIET  Eat a healthy diet  Be sure to include plenty of vegetables, fruits, low-fat dairy products, and lean protein.  Do not eat a lot of foods high in solid fats, added sugars, or salt.  Get regular exercise. This is one of the most important things you can do for your health.  Most adults should exercise for at least 150 minutes each week. The exercise  should increase your heart rate and make you sweat (moderate-intensity exercise).  Most adults should also do strengthening exercises at least twice a week. This is in addition to the moderate-intensity exercise.  Maintain a healthy weight  Body mass index (BMI) is a measurement that can be used to identify possible weight problems. It estimates body fat based on height and weight. Your health care provider can help determine your BMI and help you achieve or maintain a healthy weight.  For females 69 years of age and older:   A BMI below 18.5 is considered underweight.  A BMI of 18.5 to 24.9 is normal.  A BMI of 25 to 29.9 is considered overweight.  A BMI of 30 and above is considered obese.  Watch levels of cholesterol and blood lipids  You should start having your blood tested for lipids and cholesterol at 49 years of age, then have this test every 5 years.  You may need to have your cholesterol levels checked more often if:  Your lipid or cholesterol levels are high.  You are older than 49 years of age.  You are at high risk for heart disease.  CANCER SCREENING   Lung Cancer  Lung cancer screening is recommended for adults 39-43 years old who are at high risk for lung cancer because of a history of smoking.  A yearly low-dose CT scan of the lungs is recommended for people who:  Currently smoke.  Have quit within the past 15 years.  Have at least a 30-pack-year history of smoking. A pack year is smoking an average of one pack of cigarettes a day for 1 year.  Yearly screening should continue until it has been 15 years since you quit.  Yearly screening should stop if you develop a health problem that would prevent you from having lung cancer treatment.  Breast Cancer  Practice breast self-awareness. This means understanding how your breasts normally appear and feel.  It also means doing regular breast self-exams. Let your health care provider know about any  changes, no matter how small.  If you are in your 20s or 30s, you should have a clinical breast exam (CBE) by a health care provider every 1-3 years as part of a regular health exam.  If you are 49 or older, have a CBE every year. Also consider having a breast X-ray (mammogram) every year.  If you have a family history of breast cancer, talk to your health care provider about genetic screening.  If you are at high risk for breast cancer, talk to your health care provider about having an MRI and a mammogram every year.  Breast cancer gene (BRCA) assessment is recommended for women who have family members with BRCA-related cancers. BRCA-related cancers include:  Breast.  Ovarian.  Tubal.  Peritoneal cancers.  Results of the assessment will determine the need for genetic counseling and BRCA1 and BRCA2 testing. Cervical Cancer Your health care provider may recommend that you be screened regularly for cancer of the pelvic organs (ovaries, uterus, and vagina). This screening involves a pelvic examination, including checking for microscopic changes to the surface of your cervix (Pap test). You may be encouraged to have this screening done every 3 years, beginning at age 68.  For women ages 42-65, health care providers may recommend pelvic exams and Pap testing every 3 years, or they may recommend the Pap and pelvic exam, combined with testing for human papilloma virus (HPV), every 5 years. Some types of HPV increase your risk of cervical cancer. Testing for HPV may also be done on women of any age with unclear Pap test results.  Other health care providers may not recommend any screening for nonpregnant women who are considered low risk for pelvic cancer and who do not have symptoms. Ask your health care provider if a screening pelvic exam is right for you.  If you have had past treatment for cervical cancer or a condition that could lead to cancer, you need Pap tests and screening for cancer  for at least 20 years after your treatment. If Pap tests have been discontinued, your risk factors (such as having a new sexual partner) need to be reassessed to determine if screening should resume. Some women have medical problems that increase the chance of getting cervical cancer. In these cases, your health care provider may recommend more frequent screening and Pap tests. Colorectal Cancer  This type of cancer can be detected and often prevented.  Routine colorectal cancer screening usually begins at 49 years of age and continues through 49 years of age.  Your health care provider may recommend screening at an earlier age if you have risk factors for colon cancer.  Your health care provider may also recommend using home test kits to check for hidden blood in the stool.  A small camera at the end of a tube can be used to examine your colon directly (sigmoidoscopy or colonoscopy). This is done to check for the earliest forms of colorectal cancer.  Routine screening usually begins at age 53.  Direct examination of the colon should be repeated every 5-10 years through 49 years of age. However, you may need to be screened more often if early forms of precancerous polyps or small growths are found. Skin Cancer  Check your skin from head to toe regularly.  Tell your health care provider about any new moles or changes in moles, especially if there is a change in a mole's shape or color.  Also tell your health care provider if you have a mole that is larger than the size of a pencil eraser.  Always use sunscreen. Apply sunscreen liberally and repeatedly throughout the day.  Protect yourself by wearing long sleeves, pants, a wide-brimmed hat, and sunglasses whenever you are outside. HEART DISEASE, DIABETES, AND HIGH BLOOD PRESSURE   High blood pressure causes heart disease and increases the risk of stroke. High blood pressure is more likely to develop in:  People who have blood pressure in  the high end of the normal range (130-139/85-89 mm Hg).  People who are overweight or obese.  People who are African American.  If you are 85-25 years of age, have your blood pressure checked every 3-5 years. If you are 53 years of age or older, have your  blood pressure checked every year. You should have your blood pressure measured twice--once when you are at a hospital or clinic, and once when you are not at a hospital or clinic. Record the average of the two measurements. To check your blood pressure when you are not at a hospital or clinic, you can use:  An automated blood pressure machine at a pharmacy.  A home blood pressure monitor.  If you are between 70 years and 5 years old, ask your health care provider if you should take aspirin to prevent strokes.  Have regular diabetes screenings. This involves taking a blood sample to check your fasting blood sugar level.  If you are at a normal weight and have a low risk for diabetes, have this test once every three years after 49 years of age.  If you are overweight and have a high risk for diabetes, consider being tested at a younger age or more often. PREVENTING INFECTION  Hepatitis B  If you have a higher risk for hepatitis B, you should be screened for this virus. You are considered at high risk for hepatitis B if:  You were born in a country where hepatitis B is common. Ask your health care provider which countries are considered high risk.  Your parents were born in a high-risk country, and you have not been immunized against hepatitis B (hepatitis B vaccine).  You have HIV or AIDS.  You use needles to inject street drugs.  You live with someone who has hepatitis B.  You have had sex with someone who has hepatitis B.  You get hemodialysis treatment.  You take certain medicines for conditions, including cancer, organ transplantation, and autoimmune conditions. Hepatitis C  Blood testing is recommended for:  Everyone  born from [birth year] through 1964.  Anyone with known risk factors for hepatitis C. Sexually transmitted infections (STIs)  You should be screened for sexually transmitted infections (STIs) including gonorrhea and chlamydia if:  You are sexually active and are younger than 49 years of age.  You are older than 49 years of age and your health care provider tells you that you are at risk for this type of infection.  Your sexual activity has changed since you were last screened and you are at an increased risk for chlamydia or gonorrhea. Ask your health care provider if you are at risk.  If you do not have HIV, but are at risk, it may be recommended that you take a prescription medicine daily to prevent HIV infection. This is called pre-exposure prophylaxis (PrEP). You are considered at risk if:  You are sexually active and do not regularly use condoms or know the HIV status of your partner(s).  You take drugs by injection.  You are sexually active with a partner who has HIV. Talk with your health care provider about whether you are at high risk of being infected with HIV. If you choose to begin PrEP, you should first be tested for HIV. You should then be tested every 3 months for as long as you are taking PrEP.  PREGNANCY   If you are premenopausal and you may become pregnant, ask your health care provider about preconception counseling.  If you may become pregnant, take 400 to 800 micrograms (mcg) of folic acid every day.  If you want to prevent pregnancy, talk to your health care provider about birth control (contraception). OSTEOPOROSIS AND MENOPAUSE   Osteoporosis is a disease in which the bones lose minerals and strength with  aging. This can result in serious bone fractures. Your risk for osteoporosis can be identified using a bone density scan.  If you are 51 years of age or older, or if you are at risk for osteoporosis and fractures, ask your health care provider if you should be  screened.  Ask your health care provider whether you should take a calcium or vitamin D supplement to lower your risk for osteoporosis.  Menopause may have certain physical symptoms and risks.  Hormone replacement therapy may reduce some of these symptoms and risks. Talk to your health care provider about whether hormone replacement therapy is right for you.  HOME CARE INSTRUCTIONS   Schedule regular health, dental, and eye exams.  Stay current with your immunizations.   Do not use any tobacco products including cigarettes, chewing tobacco, or electronic cigarettes.  If you are pregnant, do not drink alcohol.  If you are breastfeeding, limit how much and how often you drink alcohol.  Limit alcohol intake to no more than 1 drink per day for nonpregnant women. One drink equals 12 ounces of beer, 5 ounces of wine, or 1 ounces of hard liquor.  Do not use street drugs.  Do not share needles.  Ask your health care provider for help if you need support or information about quitting drugs.  Tell your health care provider if you often feel depressed.  Tell your health care provider if you have ever been abused or do not feel safe at home.   This information is not intended to replace advice given to you by your health care provider. Make sure you discuss any questions you have with your health care provider.   Document Released: 04/27/2011 Document Revised: 11/02/2014 Document Reviewed: 09/13/2013 Elsevier Interactive Patient Education [employer].

## 2016-05-20 LAB — URINALYSIS W MICROSCOPIC + REFLEX CULTURE
BILIRUBIN URINE: NEGATIVE
Bacteria, UA: NONE SEEN [HPF]
CASTS: NONE SEEN [LPF]
Crystals: NONE SEEN [HPF]
GLUCOSE, UA: NEGATIVE
HGB URINE DIPSTICK: NEGATIVE
KETONES UR: NEGATIVE
LEUKOCYTES UA: NEGATIVE
NITRITE: NEGATIVE
Protein, ur: NEGATIVE
SPECIFIC GRAVITY, URINE: 1.019 (ref 1.001–1.035)
WBC UA: NONE SEEN WBC/HPF (ref <=5)
Yeast: NONE SEEN [HPF]
pH: 5 (ref 5.0–8.0)

## 2016-05-20 LAB — VITAMIN D 25 HYDROXY (VIT D DEFICIENCY, FRACTURES): Vit D, 25-Hydroxy: 43 ng/mL (ref 30–100)

## 2016-05-20 LAB — HEMOGLOBIN A1C
Hgb A1c MFr Bld: 6 % — ABNORMAL HIGH (ref <5.7)
Mean Plasma Glucose: 126 mg/dL

## 2016-05-21 LAB — URINE CULTURE

## 2017-01-19 ENCOUNTER — Ambulatory Visit (HOSPITAL_COMMUNITY)
Admission: EM | Admit: 2017-01-19 | Discharge: 2017-01-19 | Disposition: A | Discharge status: Home / Self Care | Payer: Commercial Managed Care - HMO | Attending: Family Medicine | Admitting: Family Medicine

## 2017-01-19 ENCOUNTER — Encounter (HOSPITAL_COMMUNITY): Payer: Self-pay | Admitting: Emergency Medicine

## 2017-01-19 DIAGNOSIS — M5430 Sciatica, unspecified side: Secondary | ICD-10-CM

## 2017-01-19 MED ORDER — PREDNISONE 5 MG PO TABS: ORAL_TABLET | ORAL | 0 refills | Status: DC

## 2017-01-19 MED ORDER — CYCLOBENZAPRINE HCL 10 MG PO TABS: 10.0000 mg | ORAL_TABLET | Freq: Two times a day (BID) | ORAL | 0 refills | Status: DC | PRN

## 2017-01-19 NOTE — Discharge Instructions (Signed)
Please follow up with PCP if not better.

## 2017-01-19 NOTE — ED Provider Notes (Signed)
CSN: [id number]     Arrival date & time 01/19/17  1038 History   First MD Initiated Contact with Patient 01/19/17 1120     Chief Complaint  Patient presents with  . Leg Pain   (Consider location/radiation/quality/duration/timing/severity/associated sxs/prior Treatment) Patient c/o bilateral leg discomfort and numbness for 2 weeks.     The history is provided by the patient.  Leg Pain  Location:  Leg Time since incident:  2 weeks Injury: no   Leg location:  L leg and R leg Pain details:    Quality:  Aching, burning and tingling   Radiates to:  L leg and R leg   Onset quality:  Sudden   Duration:  2 weeks   Timing:  Constant Chronicity:  New Dislocation: no   Foreign body present:  No foreign bodies Tetanus status:  Unknown Prior injury to area:  No Relieved by:  None tried Worsened by:  Nothing Ineffective treatments:  None tried Associated symptoms: back pain, fatigue, muscle weakness and tingling     Past Medical History:  Diagnosis Date  . Acute meniscal tear of left knee   . History of hypothyroidism    Past Surgical History:  Procedure Laterality Date  . [place] ENDOMETRIAL ABLATION  03-16-2007  . KNEE ARTHROSCOPY WITH MEDIAL MENISECTOMY Left 05/17/2015   Procedure: LEFT KNEE ARTHROSCOPY WITH MEDIAL MENISECTOMY, CHONDROPLASTY PATELLA;  Surgeon: Latanya Maudlin, MD;  Location: [facility];  Service: Orthopedics;  Laterality: Left;  . LAPAROSOCPY BILATERAL TUBAL LIGATION W/ CAUTERIZATION  10-15-2000  . VAGINAL HYSTERECTOMY  01/25/2012   Procedure: HYSTERECTOMY VAGINAL;  Surgeon: Anastasio Auerbach, MD;  Location: [facility];  Service: Gynecology;  Laterality: N/A;   Family History  Problem Relation Age of Onset  . Hypertension Mother   . Cancer Mother     LUG, THROAT, & BACK  . Cancer Father     THROAT  . Cancer Maternal Grandmother     KIDNEY  . Cancer Maternal Grandfather     COLON   Social History  Substance Use Topics  .  Smoking status: Never Smoker  . Smokeless tobacco: Never Used  . Alcohol use No   OB History    Gravida Para Term Preterm AB Living   7 4     3      SAB TAB Ectopic Multiple Live Births   3             Review of Systems  Constitutional: Positive for fatigue.  HENT: Negative.   Eyes: Negative.   Respiratory: Negative.   Cardiovascular: Negative.   Gastrointestinal: Negative.   Endocrine: Negative.   Genitourinary: Negative.   Musculoskeletal: Positive for back pain.  Allergic/Immunologic: Negative.   Neurological: Positive for weakness and numbness.    Allergies  Adhesive [tape]  Home Medications   Prior to Admission medications   Medication Sig Start Date End Date Taking? Authorizing Provider  cyclobenzaprine (FLEXERIL) 10 MG tablet Take 1 tablet (10 mg total) by mouth 2 (two) times daily as needed for muscle spasms. 01/19/17   Lysbeth Penner, FNP  predniSONE (DELTASONE) 5 MG tablet Take 6-5-4-3-2-1 po qd 01/19/17   Lysbeth Penner, FNP   Meds Ordered and Administered this Visit  Medications - No data to display  BP 128/85 (BP Location: Left Arm)   Pulse 81   Temp 98.2 F (36.8 C) (Oral)   Resp 18   Ht 5\' 8"  (1.727 m)   Wt 232 lb (105.2 kg)  LMP 01/03/2012   SpO2 100%   BMI 35.28 kg/m  No data found.   Physical Exam  Constitutional: She is oriented to person, place, and time. She appears well-developed and well-nourished.  HENT:  Head: Normocephalic.  Right Ear: External ear normal.  Left Ear: External ear normal.  Eyes: Conjunctivae and EOM are normal. Pupils are equal, round, and reactive to light.  Neck: Normal range of motion. Neck supple.  Cardiovascular: Normal rate, regular rhythm and normal heart sounds.   Pulmonary/Chest: Effort normal and breath sounds normal.  Abdominal: Soft. Bowel sounds are normal.  Musculoskeletal: Normal range of motion. She exhibits tenderness.  TTP bilateral sciatic notch.  Neg SLR bilateral.   Neurological: She is  alert and oriented to person, place, and time.  Vitals reviewed.   Urgent Care Course     Procedures (including critical care time)  Labs Review Labs Reviewed - No data to display  Imaging Review No results found.   Visual Acuity Review  Right Eye Distance:   Left Eye Distance:   Bilateral Distance:    Right Eye Near:   Left Eye Near:    Bilateral Near:         MDM   1. Sciatica, unspecified laterality    Prednisone taper 5mg  #21 Flexeril 5mg  one po bid #20  Follow up with PCP       Lysbeth Penner, FNP 01/19/17 1201

## 2017-01-19 NOTE — ED Triage Notes (Signed)
PT reports bilateral leg pain and swelling and bilateral foot pain for 2 weeks. No lifestyle changes. Pt reports repaired meniscus bilaterally 2-3 years ago.

## 2017-04-30 ENCOUNTER — Other Ambulatory Visit: Payer: Self-pay | Admitting: Women's Health

## 2017-04-30 DIAGNOSIS — Z1231 Encounter for screening mammogram for malignant neoplasm of breast: Secondary | ICD-10-CM

## 2017-05-12 ENCOUNTER — Ambulatory Visit
Admission: RE | Admit: 2017-05-12 | Discharge: 2017-05-12 | Disposition: A | Discharge status: Home / Self Care | Payer: Commercial Managed Care - HMO | Source: Ambulatory Visit | Attending: Women's Health | Admitting: Women's Health

## 2017-05-12 DIAGNOSIS — Z1231 Encounter for screening mammogram for malignant neoplasm of breast: Secondary | ICD-10-CM

## 2017-06-07 ENCOUNTER — Encounter: Payer: Commercial Managed Care - HMO | Admitting: Women's Health

## 2017-06-09 ENCOUNTER — Ambulatory Visit (INDEPENDENT_AMBULATORY_CARE_PROVIDER_SITE_OTHER): Payer: 59 | Admitting: Women's Health

## 2017-06-09 ENCOUNTER — Encounter: Payer: Self-pay | Admitting: Women's Health

## 2017-06-09 VITALS — BP 126/80 | Ht 68.0 in | Wt 226.0 lb

## 2017-06-09 DIAGNOSIS — E038 Other specified hypothyroidism: Secondary | ICD-10-CM | POA: Diagnosis not present

## 2017-06-09 DIAGNOSIS — Z01419 Encounter for gynecological examination (general) (routine) without abnormal findings: Secondary | ICD-10-CM | POA: Diagnosis not present

## 2017-06-09 DIAGNOSIS — Z833 Family history of diabetes mellitus: Secondary | ICD-10-CM

## 2017-06-09 LAB — URINALYSIS W MICROSCOPIC + REFLEX CULTURE
BACTERIA UA: NONE SEEN [HPF]
BILIRUBIN URINE: NEGATIVE
CASTS: NONE SEEN [LPF]
Crystals: NONE SEEN [HPF]
Glucose, UA: NEGATIVE
HGB URINE DIPSTICK: NEGATIVE
KETONES UR: NEGATIVE
Leukocytes, UA: NEGATIVE
Nitrite: NEGATIVE
Protein, ur: NEGATIVE
SPECIFIC GRAVITY, URINE: 1.024 (ref 1.001–1.035)
Yeast: NONE SEEN [HPF]
pH: 5.5 (ref 5.0–8.0)

## 2017-06-09 LAB — CBC WITH DIFFERENTIAL/PLATELET
BASOS ABS: 39 {cells}/uL (ref 0–200)
Basophils Relative: 1 %
Eosinophils Absolute: 78 cells/uL (ref 15–500)
Eosinophils Relative: 2 %
HEMATOCRIT: 40.1 % (ref 35.0–45.0)
HEMOGLOBIN: 13.4 g/dL (ref 11.7–15.5)
LYMPHS PCT: 53 %
Lymphs Abs: 2067 cells/uL (ref 850–3900)
MCH: 28.8 pg (ref 27.0–33.0)
MCHC: 33.4 g/dL (ref 32.0–36.0)
MCV: 86.1 fL (ref 80.0–100.0)
MPV: 10.6 fL (ref 7.5–12.5)
Monocytes Absolute: 195 cells/uL — ABNORMAL LOW (ref 200–950)
Monocytes Relative: 5 %
Neutro Abs: 1521 cells/uL (ref 1500–7800)
Neutrophils Relative %: 39 %
Platelets: 288 10*3/uL (ref 140–400)
RBC: 4.66 MIL/uL (ref 3.80–5.10)
RDW: 13.6 % (ref 11.0–15.0)
WBC: 3.9 10*3/uL (ref 3.8–10.8)

## 2017-06-09 LAB — TSH: TSH: 2.45 m[IU]/L

## 2017-06-09 NOTE — Progress Notes (Signed)
[name] Mar 01, 1966 [id number]    History:    Presents for annual exam.  2013 TVH for dysmenorrhea. Normal Pap and mammogram history. Hypothyroid primary care managed, no longer on Synthroid, had normal thyroid function. History of a normal lipid panel. Has gained 20 pounds in the past year.  Past medical history, past surgical history, family history and social history were all reviewed and documented in the EPIC chart. Working part time at [employer]. Has 2 in college, 1 graduated. Mother died of lung cancer, father died of throat cancer.  ROS:  A ROS was performed and pertinent positives and negatives are included.  Exam:  Vitals:   06/09/17 0851  BP: 126/80  Weight: 226 lb (102.5 kg)  Height: 5\' 8"  (1.727 m)   Body mass index is 34.36 kg/m.   General appearance:  Normal Thyroid:  Symmetrical, normal in size, without palpable masses or nodularity. Respiratory  Auscultation:  Clear without wheezing or rhonchi Cardiovascular  Auscultation:  Regular rate, without rubs, murmurs or gallops  Edema/varicosities:  Not grossly evident Abdominal  Soft,nontender, without masses, guarding or rebound.  Liver/spleen:  No organomegaly noted  Hernia:  None appreciated  Skin  Inspection:  Grossly normal   Breasts: Examined lying and sitting.     Right: Without masses, retractions, discharge or axillary adenopathy.     Left: Without masses, retractions, discharge or axillary adenopathy. Gentitourinary   Inguinal/mons:  Normal without inguinal adenopathy  External genitalia:  Normal  BUS/Urethra/Skene's glands:  Normal  Vagina:  Normal  Cervix:  And uterus absent  Adnexa/parametria:     Rt: Without masses or tenderness.   Lt: Without masses or tenderness.  Anus and perineum: Normal  Digital rectal exam: Normal sphincter tone without palpated masses or tenderness  Assessment/Plan:  51 y.o. MBF G7 P3 for annual exam with complaint of bilateral knee pain after standing long  periods.  2013 TVH for dysmenorrhea on no HRT minimal menopausal symptoms Hypothyroid history Obesity  Plan: SBE's, continue annual screening mammogram, calcium rich diet, vitamin D 1000 daily encouraged. Screening colonoscopy, [facility] information given instructed to schedule. Reviewed importance of increasing exercise and decreasing calories for weight loss, Weight Watchers encouraged. Reviewed weight loss will help with knee discomfort. TSH, CBC, CMP, normal lipid panel will repeat next year. Pap screening guidelines reviewed.    Huel Cote [facility], 10:44 AM 06/09/2017

## 2017-06-09 NOTE — Patient Instructions (Signed)
Colonoscopy  Dr Gessner  [phone]  Health Maintenance for Postmenopausal Women Menopause is a normal process in which your reproductive ability comes to an end. This process happens gradually over a span of months to years, usually between the ages of 48 and 55. Menopause is complete when you have missed 12 consecutive menstrual periods. It is important to talk with your health care provider about some of the most common conditions that affect postmenopausal women, such as heart disease, cancer, and bone loss (osteoporosis). Adopting a healthy lifestyle and getting preventive care can help to promote your health and wellness. Those actions can also lower your chances of developing some of these common conditions. What should I know about menopause? During menopause, you may experience a number of symptoms, such as:  Moderate-to-severe hot flashes.  Night sweats.  Decrease in sex drive.  Mood swings.  Headaches.  Tiredness.  Irritability.  Memory problems.  Insomnia.  Choosing to treat or not to treat menopausal changes is an individual decision that you make with your health care provider. What should I know about hormone replacement therapy and supplements? Hormone therapy products are effective for treating symptoms that are associated with menopause, such as hot flashes and night sweats. Hormone replacement carries certain risks, especially as you become older. If you are thinking about using estrogen or estrogen with progestin treatments, discuss the benefits and risks with your health care provider. What should I know about heart disease and stroke? Heart disease, heart attack, and stroke become more likely as you age. This may be due, in part, to the hormonal changes that your body experiences during menopause. These can affect how your body processes dietary fats, triglycerides, and cholesterol. Heart attack and stroke are both medical emergencies. There are many things that you  can do to help prevent heart disease and stroke:  Have your blood pressure checked at least every 1-2 years. High blood pressure causes heart disease and increases the risk of stroke.  If you are 55-79 years old, ask your health care provider if you should take aspirin to prevent a heart attack or a stroke.  Do not use any tobacco products, including cigarettes, chewing tobacco, or electronic cigarettes. If you need help quitting, ask your health care provider.  It is important to eat a healthy diet and maintain a healthy weight. ? Be sure to include plenty of vegetables, fruits, low-fat dairy products, and lean protein. ? Avoid eating foods that are high in solid fats, added sugars, or salt (sodium).  Get regular exercise. This is one of the most important things that you can do for your health. ? Try to exercise for at least 150 minutes each week. The type of exercise that you do should increase your heart rate and make you sweat. This is known as moderate-intensity exercise. ? Try to do strengthening exercises at least twice each week. Do these in addition to the moderate-intensity exercise.  Know your numbers.Ask your health care provider to check your cholesterol and your blood glucose. Continue to have your blood tested as directed by your health care provider.  What should I know about cancer screening? There are several types of cancer. Take the following steps to reduce your risk and to catch any cancer development as early as possible. Breast Cancer  Practice breast self-awareness. ? This means understanding how your breasts normally appear and feel. ? It also means doing regular breast self-exams. Let your health care provider know about any changes, no matter   matter how small.  If you are 74 or older, have a clinician do a breast exam (clinical breast exam or CBE) every year. Depending on your age, family history, and medical history, it may be recommended that you also have a  yearly breast X-ray (mammogram).  If you have a family history of breast cancer, talk with your health care provider about genetic screening.  If you are at high risk for breast cancer, talk with your health care provider about having an MRI and a mammogram every year.  Breast cancer (BRCA) gene test is recommended for women who have family members with BRCA-related cancers. Results of the assessment will determine the need for genetic counseling and BRCA1 and for BRCA2 testing. BRCA-related cancers include these types: ? Breast. This occurs in males or females. ? Ovarian. ? Tubal. This may also be called fallopian tube cancer. ? Cancer of the abdominal or pelvic lining (peritoneal cancer). ? Prostate. ? Pancreatic.  Cervical, Uterine, and Ovarian Cancer Your health care provider may recommend that you be screened regularly for cancer of the pelvic organs. These include your ovaries, uterus, and vagina. This screening involves a pelvic exam, which includes checking for microscopic changes to the surface of your cervix (Pap test).  For women ages 21-65, health care providers may recommend a pelvic exam and a Pap test every three years. For women ages 24-65, they may recommend the Pap test and pelvic exam, combined with testing for human papilloma virus (HPV), every five years. Some types of HPV increase your risk of cervical cancer. Testing for HPV may also be done on women of any age who have unclear Pap test results.  Other health care providers may not recommend any screening for nonpregnant women who are considered low risk for pelvic cancer and have no symptoms. Ask your health care provider if a screening pelvic exam is right for you.  If you have had past treatment for cervical cancer or a condition that could lead to cancer, you need Pap tests and screening for cancer for at least 20 years after your treatment. If Pap tests have been discontinued for you, your risk factors (such as having  a new sexual partner) need to be reassessed to determine if you should start having screenings again. Some women have medical problems that increase the chance of getting cervical cancer. In these cases, your health care provider may recommend that you have screening and Pap tests more often.  If you have a family history of uterine cancer or ovarian cancer, talk with your health care provider about genetic screening.  If you have vaginal bleeding after reaching menopause, tell your health care provider.  There are currently no reliable tests available to screen for ovarian cancer.  Lung Cancer Lung cancer screening is recommended for adults 26-55 years old who are at high risk for lung cancer because of a history of smoking. A yearly low-dose CT scan of the lungs is recommended if you:  Currently smoke.  Have a history of at least 30 pack-years of smoking and you currently smoke or have quit within the past 15 years. A pack-year is smoking an average of one pack of cigarettes per day for one year.  Yearly screening should:  Continue until it has been 15 years since you quit.  Stop if you develop a health problem that would prevent you from having lung cancer treatment.  Colorectal Cancer  This type of cancer can be detected and can often be prevented.  Routine colorectal cancer screening usually begins at age 50 and continues through age 75.  If you have risk factors for colon cancer, your health care provider may recommend that you be screened at an earlier age.  If you have a family history of colorectal cancer, talk with your health care provider about genetic screening.  Your health care provider may also recommend using home test kits to check for hidden blood in your stool.  A small camera at the end of a tube can be used to examine your colon directly (sigmoidoscopy or colonoscopy). This is done to check for the earliest forms of colorectal cancer.  Direct examination of the  colon should be repeated every 5-10 years until age 75. However, if early forms of precancerous polyps or small growths are found or if you have a family history or genetic risk for colorectal cancer, you may need to be screened more often.  Skin Cancer  Check your skin from head to toe regularly.  Monitor any moles. Be sure to tell your health care provider: ? About any new moles or changes in moles, especially if there is a change in a mole's shape or color. ? If you have a mole that is larger than the size of a pencil eraser.  If any of your family members has a history of skin cancer, especially at a Tyquan Carmickle age, talk with your health care provider about genetic screening.  Always use sunscreen. Apply sunscreen liberally and repeatedly throughout the day.  Whenever you are outside, protect yourself by wearing long sleeves, pants, a wide-brimmed hat, and sunglasses.  What should I know about osteoporosis? Osteoporosis is a condition in which bone destruction happens more quickly than new bone creation. After menopause, you may be at an increased risk for osteoporosis. To help prevent osteoporosis or the bone fractures that can happen because of osteoporosis, the following is recommended:  If you are 19-50 years old, get at least 1,000 mg of calcium and at least 600 mg of vitamin D per day.  If you are older than age 50 but younger than age 70, get at least 1,200 mg of calcium and at least 600 mg of vitamin D per day.  If you are older than age 70, get at least 1,200 mg of calcium and at least 800 mg of vitamin D per day.  Smoking and excessive alcohol intake increase the risk of osteoporosis. Eat foods that are rich in calcium and vitamin D, and do weight-bearing exercises several times each week as directed by your health care provider. What should I know about how menopause affects my mental health? Depression may occur at any age, but it is more common as you become older. Common  symptoms of depression include:  Low or sad mood.  Changes in sleep patterns.  Changes in appetite or eating patterns.  Feeling an overall lack of motivation or enjoyment of activities that you previously enjoyed.  Frequent crying spells.  Talk with your health care provider if you think that you are experiencing depression. What should I know about immunizations? It is important that you get and maintain your immunizations. These include:  Tetanus, diphtheria, and pertussis (Tdap) booster vaccine.  Influenza every year before the flu season begins.  Pneumonia vaccine.  Shingles vaccine.  Your health care provider may also recommend other immunizations. This information is not intended to replace advice given to you by your health care provider. Make sure you discuss any questions you have with   your health care provider. Document Released: 12/04/2005 Document Revised: 05/01/2016 Document Reviewed: 07/16/2015 Elsevier Interactive Patient Education  2018 [employer].

## 2017-06-10 LAB — COMPREHENSIVE METABOLIC PANEL
ALBUMIN: 4.2 g/dL (ref 3.6–5.1)
ALT: 55 U/L — ABNORMAL HIGH (ref 6–29)
AST: 35 U/L (ref 10–35)
Alkaline Phosphatase: 78 U/L (ref 33–130)
BUN: 13 mg/dL (ref 7–25)
CALCIUM: 9.7 mg/dL (ref 8.6–10.4)
CO2: 21 mmol/L (ref 20–32)
Chloride: 101 mmol/L (ref 98–110)
Creat: 0.85 mg/dL (ref 0.50–1.05)
GLUCOSE: 133 mg/dL — AB (ref 65–99)
Potassium: 4.5 mmol/L (ref 3.5–5.3)
Sodium: 136 mmol/L (ref 135–146)
Total Bilirubin: 0.4 mg/dL (ref 0.2–1.2)
Total Protein: 7.1 g/dL (ref 6.1–8.1)

## 2017-06-10 LAB — HEMOGLOBIN A1C
Hgb A1c MFr Bld: 6.2 % — ABNORMAL HIGH (ref <5.7)
MEAN PLASMA GLUCOSE: 131 mg/dL

## 2017-06-10 LAB — URINE CULTURE: ORGANISM ID, BACTERIA: NO GROWTH

## 2017-07-29 ENCOUNTER — Encounter: Payer: Self-pay | Admitting: Gastroenterology

## 2017-08-07 DIAGNOSIS — H40033 Anatomical narrow angle, bilateral: Secondary | ICD-10-CM | POA: Diagnosis not present

## 2017-08-07 DIAGNOSIS — H04123 Dry eye syndrome of bilateral lacrimal glands: Secondary | ICD-10-CM | POA: Diagnosis not present

## 2017-09-06 DIAGNOSIS — M6281 Muscle weakness (generalized): Secondary | ICD-10-CM | POA: Diagnosis not present

## 2017-09-06 DIAGNOSIS — M25611 Stiffness of right shoulder, not elsewhere classified: Secondary | ICD-10-CM | POA: Diagnosis not present

## 2017-09-06 DIAGNOSIS — M25511 Pain in right shoulder: Secondary | ICD-10-CM | POA: Diagnosis not present

## 2017-09-08 DIAGNOSIS — M6281 Muscle weakness (generalized): Secondary | ICD-10-CM | POA: Diagnosis not present

## 2017-09-08 DIAGNOSIS — M25611 Stiffness of right shoulder, not elsewhere classified: Secondary | ICD-10-CM | POA: Diagnosis not present

## 2017-09-08 DIAGNOSIS — M25511 Pain in right shoulder: Secondary | ICD-10-CM | POA: Diagnosis not present

## 2017-09-10 DIAGNOSIS — M25611 Stiffness of right shoulder, not elsewhere classified: Secondary | ICD-10-CM | POA: Diagnosis not present

## 2017-09-10 DIAGNOSIS — M6281 Muscle weakness (generalized): Secondary | ICD-10-CM | POA: Diagnosis not present

## 2017-09-10 DIAGNOSIS — M25511 Pain in right shoulder: Secondary | ICD-10-CM | POA: Diagnosis not present

## 2017-09-13 DIAGNOSIS — M25611 Stiffness of right shoulder, not elsewhere classified: Secondary | ICD-10-CM | POA: Diagnosis not present

## 2017-09-13 DIAGNOSIS — M25511 Pain in right shoulder: Secondary | ICD-10-CM | POA: Diagnosis not present

## 2017-09-13 DIAGNOSIS — M6281 Muscle weakness (generalized): Secondary | ICD-10-CM | POA: Diagnosis not present

## 2017-09-15 DIAGNOSIS — M6281 Muscle weakness (generalized): Secondary | ICD-10-CM | POA: Diagnosis not present

## 2017-09-15 DIAGNOSIS — M25611 Stiffness of right shoulder, not elsewhere classified: Secondary | ICD-10-CM | POA: Diagnosis not present

## 2017-09-15 DIAGNOSIS — M25511 Pain in right shoulder: Secondary | ICD-10-CM | POA: Diagnosis not present

## 2017-09-20 ENCOUNTER — Ambulatory Visit: Payer: 59 | Admitting: Family Medicine

## 2017-09-20 ENCOUNTER — Ambulatory Visit (AMBULATORY_SURGERY_CENTER): Payer: Self-pay | Admitting: *Deleted

## 2017-09-20 ENCOUNTER — Other Ambulatory Visit: Payer: Self-pay

## 2017-09-20 ENCOUNTER — Encounter: Payer: Self-pay | Admitting: Family Medicine

## 2017-09-20 VITALS — BP 125/81 | HR 65 | Ht 68.0 in | Wt 231.7 lb

## 2017-09-20 VITALS — Ht 68.0 in | Wt 231.0 lb

## 2017-09-20 DIAGNOSIS — E039 Hypothyroidism, unspecified: Secondary | ICD-10-CM | POA: Diagnosis not present

## 2017-09-20 DIAGNOSIS — N951 Menopausal and female climacteric states: Secondary | ICD-10-CM

## 2017-09-20 DIAGNOSIS — Z6835 Body mass index (BMI) 35.0-35.9, adult: Secondary | ICD-10-CM | POA: Diagnosis not present

## 2017-09-20 DIAGNOSIS — R7303 Prediabetes: Secondary | ICD-10-CM

## 2017-09-20 DIAGNOSIS — E559 Vitamin D deficiency, unspecified: Secondary | ICD-10-CM

## 2017-09-20 DIAGNOSIS — Z800 Family history of malignant neoplasm of digestive organs: Secondary | ICD-10-CM | POA: Insufficient documentation

## 2017-09-20 DIAGNOSIS — R7401 Elevation of levels of liver transaminase levels: Secondary | ICD-10-CM | POA: Insufficient documentation

## 2017-09-20 DIAGNOSIS — M25611 Stiffness of right shoulder, not elsewhere classified: Secondary | ICD-10-CM | POA: Diagnosis not present

## 2017-09-20 DIAGNOSIS — Z833 Family history of diabetes mellitus: Secondary | ICD-10-CM

## 2017-09-20 DIAGNOSIS — Z803 Family history of malignant neoplasm of breast: Secondary | ICD-10-CM

## 2017-09-20 DIAGNOSIS — M25562 Pain in left knee: Secondary | ICD-10-CM | POA: Insufficient documentation

## 2017-09-20 DIAGNOSIS — R739 Hyperglycemia, unspecified: Secondary | ICD-10-CM | POA: Insufficient documentation

## 2017-09-20 DIAGNOSIS — Z1211 Encounter for screening for malignant neoplasm of colon: Secondary | ICD-10-CM

## 2017-09-20 DIAGNOSIS — R740 Nonspecific elevation of levels of transaminase and lactic acid dehydrogenase [LDH]: Secondary | ICD-10-CM

## 2017-09-20 DIAGNOSIS — E786 Lipoprotein deficiency: Secondary | ICD-10-CM

## 2017-09-20 DIAGNOSIS — E782 Mixed hyperlipidemia: Secondary | ICD-10-CM | POA: Insufficient documentation

## 2017-09-20 DIAGNOSIS — M25511 Pain in right shoulder: Secondary | ICD-10-CM | POA: Diagnosis not present

## 2017-09-20 DIAGNOSIS — M6281 Muscle weakness (generalized): Secondary | ICD-10-CM | POA: Diagnosis not present

## 2017-09-20 DIAGNOSIS — Z90710 Acquired absence of both cervix and uterus: Secondary | ICD-10-CM | POA: Insufficient documentation

## 2017-09-20 HISTORY — DX: Prediabetes: R73.03

## 2017-09-20 MED ORDER — NA SULFATE-K SULFATE-MG SULF 17.5-3.13-1.6 GM/177ML PO SOLN: ORAL | 0 refills | Status: DC

## 2017-09-20 NOTE — Progress Notes (Signed)
New patient office visit note:  Impression and Recommendations:    1. Severe obesity (BMI 35.0-35.9 with comorbidity) ([place])   2. Hypothyroidism, unspecified type   3. Perimenopausal   4. Prediabetes-since at least 2013   5. Elevated alanine aminotransferase (ALT) level   6. Mixed hyperlipidemia   7. Low HDL (under 40)   8. Family history of breast cancer in mother-deceased late 75s   9. Family history of colon cancer- MGF- in 60's   10. Vitamin D deficiency   11. Family history of diabetes mellitus (DM)- in daughter      There are no diagnoses linked to this encounter.  There are no diagnoses linked to this encounter.  No problem-specific Assessment & Plan notes found for this encounter.    Education and routine counseling performed. Handouts provided.  Orders Placed This Encounter  Procedures  . CBC with Differential/Platelet  . Comprehensive metabolic panel  . Lipid panel  . Hemoglobin A1c  . T3, free  . T4, free  . TSH  . VITAMIN D 25 Hydroxy (Vit-D Deficiency, Fractures)    Gross side effects, risk and benefits, and alternatives of medications discussed with patient.  Patient is aware that all medications have potential side effects and we are unable to predict every side effect or drug-drug interaction that may occur.  Expresses verbal understanding and consents to current therapy plan and treatment regimen.  Return for near future- come in FBW, then OV with me 1 wk later..  Please see AVS handed out to patient at the end of our visit for further patient instructions/ counseling done pertaining to today's office visit.    Note: This document was prepared using Dragon voice recognition software and may include unintentional dictation errors.  ----------------------------------------------------------------------------------------------------------------------    Subjective:    Chief complaint:   Chief Complaint  Patient presents with  . Establish  Care     HPI: [name] is a pleasant 51 y.o. female who presents to [place] at [facility] today to review their medical history with me and establish care.   I asked the patient to review their chronic problem list with me to ensure everything was updated and accurate.    All recent office visits with other providers, any medical records that patient brought in etc  - I reviewed today.     Also asked pt to get me medical records from [facility] providers/ specialists that they had seen within the past 3-5 years- if they are in private practice and/or do not work for a [employer], [facility], [place], [place] or [employer] owned practice.  Told them to call their specialists to clarify this if they are not sure.   Lately has seen [facility] for primary care.   Pt's goal is to transfer care to a PCP.  Hyst- 2013 or so.   - 6 patient was recently told that her A1c has gotten worse.  It was last checked approximately a year ago and was 6.0, recently in August it was 6.3.  Patient's GYN had told her at that time she needs a PCP to address this issue.  Problem  Prediabetes   Elevated blood glucose since 2013 possibly prior.   Mixed Hyperlipidemia  Low Hdl (Under 40)  Severe Obesity (Bmi 35.0-35.9 With Comorbidity) ([facility])  Elevated Alanine Aminotransferase (Alt) Level  Hypothyroidism   H/o hypothyroidism.  Per patient she was on medicine for a period of time but the past year  or so she has been told she does not need the medicine.    H/O total hysterectomy- benign reasons   02-12-12 TVH  for dysmenorrhea. Having minimal menopausal symptoms. Normal Pap and mammogram history. Hypothyroidism managed by primary care.  We believe patient does have her ovaries as surgical report suggest total vaginal hysterectomy   Family history of breast cancer in mother-deceased late 60s  Family history of colon cancer- MGF- in 57's  Vitamin D Deficiency   Takes over-the-counter 5000 IUs of vitamin D3  daily    Family history of diabetes mellitus (DM)- in daughter  Left knee pain- Dr Amedeo Plenty  Perimenopausal  Blood Glucose Elevated      Wt Readings from Last 3 Encounters:  Oct 11, 2017 231 lb 11.2 oz (105.1 kg)  10/11/17 231 lb (104.8 kg)  06/09/17 226 lb (102.5 kg)   BP Readings from Last 3 Encounters:  10-11-17 125/81  06/09/17 126/80  01/19/17 128/85   Pulse Readings from Last 3 Encounters:  2017-10-11 65  01/19/17 81  05/17/15 67   BMI Readings from Last 3 Encounters:  Oct 11, 2017 35.23 kg/m  2017/10/11 35.12 kg/m  06/09/17 34.36 kg/m    Patient Care Team    Relationship Specialty Notifications Start End  Mellody Dance, DO PCP - General Family Medicine  08/13/17   Roseanne Kaufman, MD Consulting Physician Orthopedic Surgery  11-Oct-2017   Doran Stabler, MD Consulting Physician Gastroenterology  October 11, 2017    Comment: Getting first colonoscopy in this October 11 2017  Huel Cote, NP Nurse Practitioner Obstetrics and Gynecology  2017/10/11     Patient Active Problem List   Diagnosis Date Noted  . Prediabetes 10-11-2017    Priority: High  . Mixed hyperlipidemia 11-Oct-2017    Priority: High  . Low HDL (under 40) October 11, 2017    Priority: High  . Severe obesity (BMI 35.0-35.9 with comorbidity) ([place]) 10/11/2017    Priority: High  . Elevated alanine aminotransferase (ALT) level 2017/10/11    Priority: Medium  . Hypothyroidism 02/08/2013    Priority: Medium  . H/O total hysterectomy- benign reasons 11-Oct-2017    Priority: Low  . Family history of breast cancer in mother-deceased late 02/12/23 Oct 11, 2017    Priority: Low  . Family history of colon cancer- MGF- in 60's 10-11-2017    Priority: Low  . Vitamin D deficiency 2017/10/11    Priority: Low  . Family history of diabetes mellitus (DM)- in daughter 2017-10-11    Priority: Low  . Left knee pain- Dr Amedeo Plenty October 11, 2017  . Perimenopausal October 11, 2017  . Blood glucose elevated October 11, 2017     Past Medical History:    Diagnosis Date  . Acute meniscal tear of left knee   . History of hypothyroidism      Past Medical History:  Diagnosis Date  . Acute meniscal tear of left knee   . History of hypothyroidism      Past Surgical History:  Procedure Laterality Date  . [place] ENDOMETRIAL ABLATION  03-16-2007  . KNEE ARTHROSCOPY Right   . KNEE ARTHROSCOPY WITH MEDIAL MENISECTOMY Left 05/17/2015   Procedure: LEFT KNEE ARTHROSCOPY WITH MEDIAL MENISECTOMY, CHONDROPLASTY PATELLA;  Surgeon: Latanya Maudlin, MD;  Location: [facility];  Service: Orthopedics;  Laterality: Left;  . LAPAROSOCPY BILATERAL TUBAL LIGATION W/ CAUTERIZATION  10-15-2000  . VAGINAL HYSTERECTOMY  01/25/2012   Procedure: HYSTERECTOMY VAGINAL;  Surgeon: Anastasio Auerbach, MD;  Location: [facility];  Service: Gynecology;  Laterality: N/A;  Family History  Problem Relation Age of Onset  . Hypertension Mother   . Cancer Mother        LUG, THROAT, & BACK  . Breast cancer Mother   . Cancer Father        THROAT  . Throat cancer Father   . Cancer Maternal Grandmother        KIDNEY  . Cancer Maternal Grandfather        COLON  . Colon cancer Maternal Grandfather 53  . Stomach cancer Neg Hx      Social History   Substance and Sexual Activity  Drug Use No     Social History   Substance and Sexual Activity  Alcohol Use No  . Alcohol/week: 0.0 oz     Social History   Tobacco Use  Smoking Status Never Smoker  Smokeless Tobacco Never Used     Outpatient Encounter Medications as of 09/20/2017  Medication Sig  . [DISCONTINUED] Na Sulfate-K Sulfate-Mg Sulf 17.5-3.13-1.6 GM/177ML SOLN Suprep (no substitutions)-TAKE AS DIRECTED.   No facility-administered encounter medications on file as of 09/20/2017.     Allergies: Adhesive [tape]   ROS   Objective:   Blood pressure 125/81, pulse 65, height 5\' 8"  (1.727 m), weight 231 lb 11.2 oz (105.1 kg), last menstrual period  01/03/2012. Body mass index is 35.23 kg/m. General: Well Developed, well nourished, and in no acute distress.  Neuro: Alert and oriented x3, extra-ocular muscles intact, sensation grossly intact.  HEENT:[place]/AT, PERRLA, neck supple, No carotid bruits Skin: no gross rashes  Cardiac: Regular rate and rhythm Respiratory: Essentially clear to auscultation bilaterally. Not using accessory muscles, speaking in full sentences.  Abdominal: not grossly distended Musculoskeletal: Ambulates w/o diff, FROM * 4 ext.  Vasc: less 2 sec cap RF, warm and pink  Psych:  No HI/SI, judgement and insight good, Euthymic mood. Full Affect.    No results found for this or any previous visit (from the past 2160 hour(s)).

## 2017-09-20 NOTE — Progress Notes (Signed)
Patient denies any allergies to eggs or soy. Patient denies any problems with anesthesia/sedation. Patient denies any oxygen use at home. Patient denies taking any diet/weight loss medications or blood thinners. EMMI education assisgned to patient on colonoscopy, this was explained and instructions given to patient. Patient aware to call us if any changes made by PCP, she is to see PCP today.

## 2017-09-20 NOTE — Patient Instructions (Addendum)
-Since [place] are going [place] of the [place] in [place] than a [place] and will be gone for over a week, we will discuss results at your follow-up office visit and will not contact you via cell phone or email   Please realize, EXERCISE IS MEDICINE!  -  American Heart Association [facility]) guidelines for exercise : If you are in good health, without any medical conditions, you should engage in 150 minutes of moderate intensity aerobic activity per week.  This means you should be huffing and puffing throughout your workout.   Engaging in regular exercise will improve brain function and memory, as well as improve mood, boost immune system and help with weight management.  As well as the other, more well-known effects of exercise such as decreasing blood sugar levels, decreasing blood pressure,  and decreasing bad cholesterol levels/ increasing good cholesterol levels.     -  The AHA strongly endorses consumption of a diet that contains a variety of foods from all the food categories with an emphasis on fruits and vegetables; fat-free and low-fat dairy products; cereal and grain products; legumes and nuts; and fish, poultry, and/or extra lean meats.    Excessive food intake, especially of foods high in saturated and trans fats, sugar, and salt, should be avoided.    Adequate water intake of roughly 1/2 of your weight in pounds, should equal the ounces of water per day you should drink.  So for instance, if you're 200 pounds, that would be 100 ounces of water per day.         Mediterranean Diet  Why follow it? Research shows. . Those who follow the Mediterranean diet have a reduced risk of heart disease  . The diet is associated with a reduced incidence of Parkinson's and Alzheimer's diseases . People following the diet may have longer life expectancies and lower rates of chronic diseases  . The Dietary Guidelines for Americans recommends the Mediterranean diet as an eating plan to promote health and prevent  disease  What Is the Mediterranean Diet?  . Healthy eating plan based on typical foods and recipes of Mediterranean-style cooking . The diet is primarily a plant based diet; these foods should make up a majority of meals   Starches - Plant based foods should make up a majority of meals - They are an important sources of vitamins, minerals, energy, antioxidants, and fiber - Choose whole grains, foods high in fiber and minimally processed items  - Typical grain sources include wheat, oats, barley, corn, brown rice, bulgar, farro, millet, polenta, couscous  - Various types of beans include chickpeas, lentils, fava beans, black beans, white beans   Fruits  Veggies - Large quantities of antioxidant rich fruits & veggies; 6 or more servings  - Vegetables can be eaten raw or lightly drizzled with oil and cooked  - Vegetables common to the traditional Mediterranean Diet include: artichokes, arugula, beets, broccoli, brussel sprouts, cabbage, carrots, celery, collard greens, cucumbers, eggplant, kale, leeks, lemons, lettuce, mushrooms, okra, onions, peas, peppers, potatoes, pumpkin, radishes, rutabaga, shallots, spinach, sweet potatoes, turnips, zucchini - Fruits common to the Mediterranean Diet include: apples, apricots, avocados, cherries, clementines, dates, figs, grapefruits, grapes, melons, nectarines, oranges, peaches, pears, pomegranates, strawberries, tangerines  Fats - Replace butter and margarine with healthy oils, such as olive oil, canola oil, and tahini  - Limit nuts to no more than a handful a day  - Nuts include walnuts, almonds, pecans, pistachios, pine nuts  - Limit or avoid candied,  honey roasted or heavily salted nuts - Olives are central to the Mediterranean diet - can be eaten whole or used in a variety of dishes   Meats Protein - Limiting red meat: no more than a few times a month - When eating red meat: choose lean cuts and keep the portion to the size of deck of cards - Eggs:  approx. 0 to 4 times a week  - Fish and lean poultry: at least 2 a week  - Healthy protein sources include, chicken, [place], lean beef, lamb - Increase intake of seafood such as tuna, salmon, trout, mackerel, shrimp, scallops - Avoid or limit high fat processed meats such as sausage and bacon  Dairy - Include moderate amounts of low fat dairy products  - Focus on healthy dairy such as fat free yogurt, skim milk, low or reduced fat cheese - Limit dairy products higher in fat such as whole or 2% milk, cheese, ice cream  Alcohol - Moderate amounts of red wine is ok  - No more than 5 oz daily for women (all ages) and men older than age 11  - No more than 10 oz of wine daily for men younger than 72  Other - Limit sweets and other desserts  - Use herbs and spices instead of salt to flavor foods  - Herbs and spices common to the traditional Mediterranean Diet include: basil, bay leaves, chives, cloves, cumin, fennel, garlic, lavender, marjoram, mint, oregano, parsley, pepper, rosemary, sage, savory, sumac, tarragon, thyme   It's not just a diet, it's a lifestyle:  . The Mediterranean diet includes lifestyle factors typical of those in the region  . Foods, drinks and meals are best eaten with others and savored . Daily physical activity is important for overall good health . This could be strenuous exercise like running and aerobics . This could also be more leisurely activities such as walking, housework, yard-work, or taking the stairs . Moderation is the key; a balanced and healthy diet accommodates most foods and drinks . Consider portion sizes and frequency of consumption of certain foods   Meal Ideas & Options:  . Breakfast:  o Whole wheat toast or whole wheat English muffins with peanut butter & hard boiled egg o Steel cut oats topped with apples & cinnamon and skim milk  o Fresh fruit: banana, strawberries, melon, berries, peaches  o Smoothies: strawberries, bananas, greek yogurt, peanut  butter o Low fat greek yogurt with blueberries and granola  o Egg white omelet with spinach and mushrooms o Breakfast couscous: whole wheat couscous, apricots, skim milk, cranberries  . Sandwiches:  o Hummus and grilled vegetables (peppers, zucchini, squash) on whole wheat bread   o Grilled chicken on whole wheat pita with lettuce, tomatoes, cucumbers or tzatziki  o Tuna salad on whole wheat bread: tuna salad made with greek yogurt, olives, red peppers, capers, green onions o Garlic rosemary lamb pita: lamb sauted with garlic, rosemary, salt & pepper; add lettuce, cucumber, greek yogurt to pita - flavor with lemon juice and black pepper  . Seafood:  o Mediterranean grilled salmon, seasoned with garlic, basil, parsley, lemon juice and black pepper o Shrimp, lemon, and spinach whole-grain pasta salad made with low fat greek yogurt  o Seared scallops with lemon orzo  o Seared tuna steaks seasoned salt, pepper, coriander topped with tomato mixture of olives, tomatoes, olive oil, minced garlic, parsley, green onions and cappers  . Meats:  o Herbed greek chicken salad with kalamata olives, cucumber,  feta  o Red bell peppers stuffed with spinach, bulgur, lean ground beef (or lentils) & topped with feta   o Kebabs: skewers of chicken, tomatoes, onions, zucchini, squash  o [place] burgers: made with red onions, mint, dill, lemon juice, feta cheese topped with roasted red peppers . Vegetarian o Cucumber salad: cucumbers, artichoke hearts, celery, red onion, feta cheese, tossed in olive oil & lemon juice  o Hummus and whole grain pita points with a greek salad (lettuce, tomato, feta, olives, cucumbers, red onion) o Lentil soup with celery, carrots made with vegetable broth, garlic, salt and pepper  o Tabouli salad: parsley, bulgur, mint, scallions, cucumbers, tomato, radishes, lemon juice, olive oil, salt and pepper.       Risk factors for prediabetes and type 2 diabetes  Researchers don't fully  understand why some people develop prediabetes and type 2 diabetes and others don't.  It's clear that certain factors increase the risk, however, including:  Weight. The more fatty tissue you have, the more resistant your cells become to insulin.  Inactivity. The less active you are, the greater your risk. Physical activity helps you control your weight, uses up glucose as energy and makes your cells more sensitive to insulin.  Family history. Your risk increases if a parent or sibling has type 2 diabetes.  Race. Although it's unclear why, people of certain races - including blacks, Hispanics, American Indians and Asian-Americans - are at higher risk.  Age. Your risk increases as you get older. This may be because you tend to exercise less, lose muscle mass and gain weight as you age. But type 2 diabetes is also increasing dramatically among children, adolescents and younger adults.  Gestational diabetes. If you developed gestational diabetes when you were pregnant, your risk of developing prediabetes and type 2 diabetes later increases. If you gave birth to a baby weighing more than 9 pounds (4 kilograms), you're also at risk of type 2 diabetes.  Polycystic ovary syndrome. For women, having polycystic ovary syndrome - a common condition characterized by irregular menstrual periods, excess hair growth and obesity - increases the risk of diabetes.  High blood pressure. Having blood pressure over 140/90 millimeters of mercury (mm Hg) is linked to an increased risk of type 2 diabetes.  Abnormal cholesterol and triglyceride levels. If you have low levels of high-density lipoprotein (HDL), or "good," cholesterol, your risk of type 2 diabetes is higher. Triglycerides are another type of fat carried in the blood. People with high levels of triglycerides have an increased risk of type 2 diabetes. Your doctor can let you know what your cholesterol and triglyceride levels are.  A good guide to good carbs: The  glycemic index ---If you have diabetes, or at risk for diabetes, you know all too well that when you eat carbohydrates, your blood sugar goes up. The total amount of carbs you consume at a meal or in a snack mostly determines what your blood sugar will do. But the food itself also plays a role. A serving of white rice has almost the same effect as eating pure table sugar - a quick, high spike in blood sugar. A serving of lentils has a slower, smaller effect.  ---Picking good sources of carbs can help you control your blood sugar and your weight. Even if you don't have diabetes, eating healthier carbohydrate-rich foods can help ward off a host of chronic conditions, from heart disease to various cancers to, well, diabetes.  ---One way to choose foods is with  the glycemic index (GI). This tool measures how much a food boosts blood sugar.  The glycemic index rates the effect of a specific amount of a food on blood sugar compared with the same amount of pure glucose. A food with a glycemic index of 28 boosts blood sugar only 28% as much as pure glucose. One with a GI of 95 acts like pure glucose.    High glycemic foods result in a quick spike in insulin and blood sugar (also known as blood glucose).  Low glycemic foods have a slower, smaller effect- these are healthier for you.   Using the glycemic index Using the glycemic index is easy: choose foods in the low GI category instead of those in the high GI category (see below), and go easy on those in between. Low glycemic index (GI of 55 or less): Most fruits and vegetables, beans, minimally processed grains, pasta, low-fat dairy foods, and nuts.  Moderate glycemic index (GI 56 to 69): White and sweet potatoes, corn, white rice, couscous, breakfast cereals such as Cream of Wheat and Mini Wheats.  High glycemic index (GI of 70 or higher): White bread, rice cakes, most crackers, bagels, cakes, doughnuts, croissants, most packaged breakfast cereals. You can  see the values for 100 commons foods and get links to more at www.health.CheapToothpicks.si.  Swaps for lowering glycemic index  Instead of this high-glycemic index food Eat this lower-glycemic index food  White rice Brown rice or converted rice  Instant oatmeal Steel-cut oats  Cornflakes Bran flakes  Baked potato Pasta, bulgur  White bread Whole-grain bread  Corn Peas or leafy greens       Prediabetes Eating Plan  Prediabetes--also called impaired glucose tolerance or impaired fasting glucose--is a condition that causes blood sugar (blood glucose) levels to be higher than normal. Following a healthy diet can help to keep prediabetes under control. It can also help to lower the risk of type 2 diabetes and heart disease, which are increased in people who have prediabetes. Along with regular exercise, a healthy diet:  Promotes weight loss.  Helps to control blood sugar levels.  Helps to improve the way that the body uses insulin.   WHAT DO I NEED TO KNOW ABOUT THIS EATING PLAN?   Use the glycemic index (GI) to plan your meals. The index tells you how quickly a food will raise your blood sugar. Choose low-GI foods. These foods take a longer time to raise blood sugar.  Pay close attention to the amount of carbohydrates in the food that you eat. Carbohydrates increase blood sugar levels.  Keep track of how many calories you take in. Eating the right amount of calories will help you to achieve a healthy weight. Losing about 7 percent of your starting weight can help to prevent type 2 diabetes.  You may want to follow a Mediterranean diet. This diet includes a lot of vegetables, lean meats or fish, whole grains, fruits, and healthy oils and fats.   WHAT FOODS CAN I EAT?  Grains Whole grains, such as whole-wheat or whole-grain breads, crackers, cereals, and pasta. Unsweetened oatmeal. Bulgur. Barley. Quinoa. Brown rice. Corn or whole-wheat flour tortillas or taco  shells. Vegetables Lettuce. Spinach. Peas. Beets. Cauliflower. Cabbage. Broccoli. Carrots. Tomatoes. Squash. Eggplant. Herbs. Peppers. Onions. Cucumbers. Brussels sprouts. Fruits Berries. Bananas. Apples. Oranges. Grapes. Papaya. Mango. Pomegranate. Kiwi. Grapefruit. Cherries. Meats and Other Protein Sources Seafood. Lean meats, such as chicken and [place] or lean cuts of pork and beef. Tofu. Eggs. Nuts. Beans.  Dairy Low-fat or fat-free dairy products, such as yogurt, cottage cheese, and cheese. Beverages Water. Tea. Coffee. Sugar-free or diet soda. Seltzer water. Milk. Milk alternatives, such as soy or almond milk. Condiments Mustard. Relish. Low-fat, low-sugar ketchup. Low-fat, low-sugar barbecue sauce. Low-fat or fat-free mayonnaise. Sweets and Desserts Sugar-free or low-fat pudding. Sugar-free or low-fat ice cream and other frozen treats. Fats and Oils Avocado. Walnuts. Olive oil. The items listed above may not be a complete list of recommended foods or beverages. Contact your dietitian for more options.    WHAT FOODS ARE NOT RECOMMENDED?  Grains Refined white flour and flour products, such as bread, pasta, snack foods, and cereals. Beverages Sweetened drinks, such as sweet iced tea and soda. Sweets and Desserts Baked goods, such as cake, cupcakes, pastries, cookies, and cheesecake. The items listed above may not be a complete list of foods and beverages to avoid. Contact your dietitian for more information.   This information is not intended to replace advice given to you by your health care provider. Make sure you discuss any questions you have with your health care provider.   Document Released: 02/26/2015 Document Reviewed: 02/26/2015 Elsevier Interactive Patient Education [employer].

## 2017-09-22 DIAGNOSIS — M25611 Stiffness of right shoulder, not elsewhere classified: Secondary | ICD-10-CM | POA: Diagnosis not present

## 2017-09-22 DIAGNOSIS — M6281 Muscle weakness (generalized): Secondary | ICD-10-CM | POA: Diagnosis not present

## 2017-09-22 DIAGNOSIS — M25511 Pain in right shoulder: Secondary | ICD-10-CM | POA: Diagnosis not present

## 2017-09-24 DIAGNOSIS — M25611 Stiffness of right shoulder, not elsewhere classified: Secondary | ICD-10-CM | POA: Diagnosis not present

## 2017-09-24 DIAGNOSIS — M25511 Pain in right shoulder: Secondary | ICD-10-CM | POA: Diagnosis not present

## 2017-09-24 DIAGNOSIS — M6281 Muscle weakness (generalized): Secondary | ICD-10-CM | POA: Diagnosis not present

## 2017-09-27 DIAGNOSIS — M25511 Pain in right shoulder: Secondary | ICD-10-CM | POA: Diagnosis not present

## 2017-09-27 DIAGNOSIS — M25611 Stiffness of right shoulder, not elsewhere classified: Secondary | ICD-10-CM | POA: Diagnosis not present

## 2017-09-27 DIAGNOSIS — M6281 Muscle weakness (generalized): Secondary | ICD-10-CM | POA: Diagnosis not present

## 2017-09-29 DIAGNOSIS — M25611 Stiffness of right shoulder, not elsewhere classified: Secondary | ICD-10-CM | POA: Diagnosis not present

## 2017-09-29 DIAGNOSIS — M25511 Pain in right shoulder: Secondary | ICD-10-CM | POA: Diagnosis not present

## 2017-09-29 DIAGNOSIS — M6281 Muscle weakness (generalized): Secondary | ICD-10-CM | POA: Diagnosis not present

## 2017-10-01 DIAGNOSIS — M6281 Muscle weakness (generalized): Secondary | ICD-10-CM | POA: Diagnosis not present

## 2017-10-01 DIAGNOSIS — M25511 Pain in right shoulder: Secondary | ICD-10-CM | POA: Diagnosis not present

## 2017-10-01 DIAGNOSIS — M25611 Stiffness of right shoulder, not elsewhere classified: Secondary | ICD-10-CM | POA: Diagnosis not present

## 2017-10-04 ENCOUNTER — Encounter: Payer: Self-pay | Admitting: Gastroenterology

## 2017-10-04 DIAGNOSIS — M25511 Pain in right shoulder: Secondary | ICD-10-CM | POA: Diagnosis not present

## 2017-10-04 DIAGNOSIS — M25611 Stiffness of right shoulder, not elsewhere classified: Secondary | ICD-10-CM | POA: Diagnosis not present

## 2017-10-04 DIAGNOSIS — M6281 Muscle weakness (generalized): Secondary | ICD-10-CM | POA: Diagnosis not present

## 2017-10-06 DIAGNOSIS — M6281 Muscle weakness (generalized): Secondary | ICD-10-CM | POA: Diagnosis not present

## 2017-10-06 DIAGNOSIS — M25611 Stiffness of right shoulder, not elsewhere classified: Secondary | ICD-10-CM | POA: Diagnosis not present

## 2017-10-06 DIAGNOSIS — M25511 Pain in right shoulder: Secondary | ICD-10-CM | POA: Diagnosis not present

## 2017-10-08 DIAGNOSIS — M25511 Pain in right shoulder: Secondary | ICD-10-CM | POA: Diagnosis not present

## 2017-10-08 DIAGNOSIS — M6281 Muscle weakness (generalized): Secondary | ICD-10-CM | POA: Diagnosis not present

## 2017-10-08 DIAGNOSIS — M25611 Stiffness of right shoulder, not elsewhere classified: Secondary | ICD-10-CM | POA: Diagnosis not present

## 2017-10-11 ENCOUNTER — Other Ambulatory Visit: Payer: 59

## 2017-10-11 ENCOUNTER — Ambulatory Visit (AMBULATORY_SURGERY_CENTER): Payer: 59 | Admitting: Gastroenterology

## 2017-10-11 ENCOUNTER — Encounter: Payer: Self-pay | Admitting: Gastroenterology

## 2017-10-11 ENCOUNTER — Other Ambulatory Visit: Payer: Self-pay

## 2017-10-11 VITALS — BP 128/87 | HR 59 | Temp 96.6°F | Resp 29 | Ht 68.0 in | Wt 231.0 lb

## 2017-10-11 DIAGNOSIS — Z1212 Encounter for screening for malignant neoplasm of rectum: Secondary | ICD-10-CM

## 2017-10-11 DIAGNOSIS — Z1211 Encounter for screening for malignant neoplasm of colon: Secondary | ICD-10-CM | POA: Diagnosis present

## 2017-10-11 DIAGNOSIS — M6281 Muscle weakness (generalized): Secondary | ICD-10-CM | POA: Diagnosis not present

## 2017-10-11 DIAGNOSIS — M25511 Pain in right shoulder: Secondary | ICD-10-CM | POA: Diagnosis not present

## 2017-10-11 DIAGNOSIS — M25611 Stiffness of right shoulder, not elsewhere classified: Secondary | ICD-10-CM | POA: Diagnosis not present

## 2017-10-11 MED ORDER — SODIUM CHLORIDE 0.9 % IV SOLN: 500.0000 mL | Freq: Once | INTRAVENOUS | Status: DC

## 2017-10-11 NOTE — Progress Notes (Signed)
Pt's states no medical or surgical changes since previsit or office visit. 

## 2017-10-11 NOTE — Patient Instructions (Signed)
YOU HAD AN ENDOSCOPIC PROCEDURE TODAY AT THE [facility]:   Refer to the procedure report that was given to you for any specific questions about what was found during the examination.  If the procedure report does not answer your questions, please call your gastroenterologist to clarify.  If you requested that your care partner not be given the details of your procedure findings, then the procedure report has been included in a sealed envelope for you to review at your convenience later.  YOU SHOULD EXPECT: Some feelings of bloating in the abdomen. Passage of more gas than usual.  Walking can help get rid of the air that was put into your GI tract during the procedure and reduce the bloating. If you had a lower endoscopy (such as a colonoscopy or flexible sigmoidoscopy) you may notice spotting of blood in your stool or on the toilet paper. If you underwent a bowel prep for your procedure, you may not have a normal bowel movement for a few days.  Please Note:  You might notice some irritation and congestion in your nose or some drainage.  This is from the oxygen used during your procedure.  There is no need for concern and it should clear up in a day or so.  SYMPTOMS TO REPORT IMMEDIATELY:   Following lower endoscopy (colonoscopy or flexible sigmoidoscopy):  Excessive amounts of blood in the stool  Significant tenderness or worsening of abdominal pains  Swelling of the abdomen that is new, acute  Fever of 100F or higher   For urgent or emergent issues, a gastroenterologist can be reached at any hour by calling [phone].   DIET:  We do recommend a small meal at first, but then you may proceed to your regular diet.  Drink plenty of fluids but you should avoid alcoholic beverages for 24 hours.  ACTIVITY:  You should plan to take it easy for the rest of today and you should NOT DRIVE or use heavy machinery until tomorrow (because of the sedation medicines used during the test).     FOLLOW UP: Our staff will call the number listed on your records the next business day following your procedure to check on you and address any questions or concerns that you may have regarding the information given to you following your procedure. If we do not reach you, we will leave a message.  However, if you are feeling well and you are not experiencing any problems, there is no need to return our call.  We will assume that you have returned to your regular daily activities without incident.  If any biopsies were taken you will be contacted by phone or by letter within the next 1-3 weeks.  Please call us at [phone] if you have not heard about the biopsies in 3 weeks.    SIGNATURES/CONFIDENTIALITY: You and/or your care partner have signed paperwork which will be entered into your electronic medical record.  These signatures attest to the fact that that the information above on your After Visit Summary has been reviewed and is understood.  Full responsibility of the confidentiality of this discharge information lies with you and/or your care-partner.  Thank you for letting us take care of your healthcare needs today. 

## 2017-10-11 NOTE — Op Note (Signed)
[facility] Patient Name: [name] Procedure Date: 10/11/2017 11:32 AM MRN: [id number] Endoscopist: Mallie Mussel L. Loletha Carrow , MD Age: 51 Referring MD:  Date of Birth: [birth year] Gender: Female Account #: [id number] Procedure:                Colonoscopy Indications:              Screening for colorectal malignant neoplasm, This                            is the patient's first colonoscopy Medicines:                Monitored Anesthesia Care Procedure:                Pre-Anesthesia Assessment:                           - Prior to the procedure, a History and Physical                            was performed, and patient medications and                            allergies were reviewed. The patient's tolerance of                            previous anesthesia was also reviewed. The risks                            and benefits of the procedure and the sedation                            options and risks were discussed with the patient.                            All questions were answered, and informed consent                            was obtained. Prior Anticoagulants: The patient has                            taken no previous anticoagulant or antiplatelet                            agents. ASA Grade Assessment: I - A normal, healthy                            patient. After reviewing the risks and benefits,                            the patient was deemed in satisfactory condition to                            undergo the procedure.  After obtaining informed consent, the colonoscope                            was passed under direct vision. Throughout the                            procedure, the patient's blood pressure, pulse, and                            oxygen saturations were monitored continuously. The                            Colonoscope was introduced through the anus and                            advanced to the the cecum,  identified by                            appendiceal orifice and ileocecal valve. The                            colonoscopy was performed without difficulty. The                            patient tolerated the procedure well. The quality                            of the bowel preparation was evaluated using the                            BBPS [facility] Bowel Preparation Scale) with scores                            of: Right Colon = 3, Transverse Colon = 3 and Left                            Colon = 3 (entire mucosa seen well with no residual                            staining, small fragments of stool or opaque                            liquid). The total BBPS score equals 9. The bowel                            preparation used was SUPREP. The ileocecal valve,                            appendiceal orifice, and rectum were photographed. Scope In: 11:36:39 AM Scope Out: 11:48:15 AM Scope Withdrawal Time: 0 hours 8 minutes 42 seconds  Total Procedure Duration: 0 hours 11 minutes 36 seconds  Findings:  The perianal and digital rectal examinations were                            normal.                           The entire examined colon appeared normal on direct                            and retroflexion views. Complications:            No immediate complications. Estimated Blood Loss:     Estimated blood loss: none. Impression:               - The entire examined colon is normal on direct and                            retroflexion views.                           - No specimens collected. Recommendation:           - Patient has a contact number available for                            emergencies. The signs and symptoms of potential                            delayed complications were discussed with the                            patient. Return to normal activities tomorrow.                            Written discharge instructions were provided to the                             patient.                           - Resume previous diet.                           - Continue present medications.                           - Repeat colonoscopy in 10 years for screening                            purposes. Kinney Sackmann L. Loletha Carrow, MD 10/11/2017 11:51:24 AM This report has been signed electronically.

## 2017-10-11 NOTE — Progress Notes (Signed)
Report given to PACU, vss 

## 2017-10-12 ENCOUNTER — Other Ambulatory Visit: Payer: 59

## 2017-10-12 ENCOUNTER — Telehealth: Payer: Self-pay

## 2017-10-12 DIAGNOSIS — E782 Mixed hyperlipidemia: Secondary | ICD-10-CM | POA: Diagnosis not present

## 2017-10-12 DIAGNOSIS — Z6835 Body mass index (BMI) 35.0-35.9, adult: Secondary | ICD-10-CM

## 2017-10-12 DIAGNOSIS — E786 Lipoprotein deficiency: Secondary | ICD-10-CM

## 2017-10-12 DIAGNOSIS — R740 Nonspecific elevation of levels of transaminase and lactic acid dehydrogenase [LDH]: Secondary | ICD-10-CM | POA: Diagnosis not present

## 2017-10-12 DIAGNOSIS — E039 Hypothyroidism, unspecified: Secondary | ICD-10-CM | POA: Diagnosis not present

## 2017-10-12 DIAGNOSIS — R7303 Prediabetes: Secondary | ICD-10-CM

## 2017-10-12 DIAGNOSIS — Z803 Family history of malignant neoplasm of breast: Secondary | ICD-10-CM

## 2017-10-12 DIAGNOSIS — E559 Vitamin D deficiency, unspecified: Secondary | ICD-10-CM

## 2017-10-12 DIAGNOSIS — N951 Menopausal and female climacteric states: Secondary | ICD-10-CM

## 2017-10-12 DIAGNOSIS — Z800 Family history of malignant neoplasm of digestive organs: Secondary | ICD-10-CM

## 2017-10-12 DIAGNOSIS — R7401 Elevation of levels of liver transaminase levels: Secondary | ICD-10-CM

## 2017-10-12 NOTE — Telephone Encounter (Signed)
Left message on voicemail.

## 2017-10-12 NOTE — Telephone Encounter (Signed)
Left message on cell phone answering machine.

## 2017-10-13 DIAGNOSIS — M25511 Pain in right shoulder: Secondary | ICD-10-CM | POA: Diagnosis not present

## 2017-10-13 DIAGNOSIS — M6281 Muscle weakness (generalized): Secondary | ICD-10-CM | POA: Diagnosis not present

## 2017-10-13 DIAGNOSIS — M25611 Stiffness of right shoulder, not elsewhere classified: Secondary | ICD-10-CM | POA: Diagnosis not present

## 2017-10-13 LAB — LIPID PANEL
Chol/HDL Ratio: 4.6 ratio — ABNORMAL HIGH (ref 0.0–4.4)
Cholesterol, Total: 190 mg/dL (ref 100–199)
HDL: 41 mg/dL (ref >39)
LDL Calculated: 128 mg/dL — ABNORMAL HIGH (ref 0–99)
TRIGLYCERIDES: 106 mg/dL (ref 0–149)
VLDL CHOLESTEROL CAL: 21 mg/dL (ref 5–40)

## 2017-10-13 LAB — CBC WITH DIFFERENTIAL/PLATELET
BASOS ABS: 0 10*3/uL (ref 0.0–0.2)
Basos: 0 %
EOS (ABSOLUTE): 0.1 10*3/uL (ref 0.0–0.4)
Eos: 2 %
HEMOGLOBIN: 13.4 g/dL (ref 11.1–15.9)
Hematocrit: 39.8 % (ref 34.0–46.6)
Immature Grans (Abs): 0 10*3/uL (ref 0.0–0.1)
Immature Granulocytes: 1 %
LYMPHS ABS: 2.2 10*3/uL (ref 0.7–3.1)
Lymphs: 47 %
MCH: 28.6 pg (ref 26.6–33.0)
MCHC: 33.7 g/dL (ref 31.5–35.7)
MCV: 85 fL (ref 79–97)
MONOS ABS: 0.3 10*3/uL (ref 0.1–0.9)
Monocytes: 7 %
NEUTROS ABS: 2 10*3/uL (ref 1.4–7.0)
Neutrophils: 43 %
Platelets: 261 10*3/uL (ref 150–379)
RBC: 4.69 x10E6/uL (ref 3.77–5.28)
RDW: 13.4 % (ref 12.3–15.4)
WBC: 4.6 10*3/uL (ref 3.4–10.8)

## 2017-10-13 LAB — T4, FREE: FREE T4: 1.17 ng/dL (ref 0.82–1.77)

## 2017-10-13 LAB — COMPREHENSIVE METABOLIC PANEL
ALT: 53 IU/L — ABNORMAL HIGH (ref 0–32)
AST: 27 IU/L (ref 0–40)
Albumin/Globulin Ratio: 1.4 (ref 1.2–2.2)
Albumin: 4.3 g/dL (ref 3.5–5.5)
Alkaline Phosphatase: 88 IU/L (ref 39–117)
BUN/Creatinine Ratio: 13 (ref 9–23)
BUN: 13 mg/dL (ref 6–24)
Bilirubin Total: 0.3 mg/dL (ref 0.0–1.2)
CO2: 25 mmol/L (ref 20–29)
Calcium: 9.5 mg/dL (ref 8.7–10.2)
Chloride: 101 mmol/L (ref 96–106)
Creatinine, Ser: 0.98 mg/dL (ref 0.57–1.00)
GFR calc Af Amer: 78 mL/min/{1.73_m2} (ref >59)
GFR calc non Af Amer: 67 mL/min/{1.73_m2} (ref >59)
Globulin, Total: 3 g/dL (ref 1.5–4.5)
Glucose: 161 mg/dL — ABNORMAL HIGH (ref 65–99)
Potassium: 4.7 mmol/L (ref 3.5–5.2)
Sodium: 136 mmol/L (ref 134–144)
Total Protein: 7.3 g/dL (ref 6.0–8.5)

## 2017-10-13 LAB — T3, FREE: T3, Free: 2.9 pg/mL (ref 2.0–4.4)

## 2017-10-13 LAB — HEMOGLOBIN A1C
ESTIMATED AVERAGE GLUCOSE: 160 mg/dL
HEMOGLOBIN A1C: 7.2 % — AB (ref 4.8–5.6)

## 2017-10-13 LAB — TSH: TSH: 3.56 u[IU]/mL (ref 0.450–4.500)

## 2017-10-13 LAB — VITAMIN D 25 HYDROXY (VIT D DEFICIENCY, FRACTURES): VIT D 25 HYDROXY: 37.4 ng/mL (ref 30.0–100.0)

## 2017-10-15 DIAGNOSIS — M6281 Muscle weakness (generalized): Secondary | ICD-10-CM | POA: Diagnosis not present

## 2017-10-15 DIAGNOSIS — M25611 Stiffness of right shoulder, not elsewhere classified: Secondary | ICD-10-CM | POA: Diagnosis not present

## 2017-10-15 DIAGNOSIS — M25511 Pain in right shoulder: Secondary | ICD-10-CM | POA: Diagnosis not present

## 2017-10-20 DIAGNOSIS — M6281 Muscle weakness (generalized): Secondary | ICD-10-CM | POA: Diagnosis not present

## 2017-10-20 DIAGNOSIS — M25511 Pain in right shoulder: Secondary | ICD-10-CM | POA: Diagnosis not present

## 2017-10-20 DIAGNOSIS — M25611 Stiffness of right shoulder, not elsewhere classified: Secondary | ICD-10-CM | POA: Diagnosis not present

## 2017-10-25 ENCOUNTER — Ambulatory Visit (INDEPENDENT_AMBULATORY_CARE_PROVIDER_SITE_OTHER): Payer: 59 | Admitting: Family Medicine

## 2017-10-25 ENCOUNTER — Encounter: Payer: Self-pay | Admitting: Family Medicine

## 2017-10-25 VITALS — BP 114/79 | HR 90 | Ht 68.0 in | Wt 230.0 lb

## 2017-10-25 DIAGNOSIS — M25611 Stiffness of right shoulder, not elsewhere classified: Secondary | ICD-10-CM | POA: Diagnosis not present

## 2017-10-25 DIAGNOSIS — I10 Essential (primary) hypertension: Secondary | ICD-10-CM

## 2017-10-25 DIAGNOSIS — E1159 Type 2 diabetes mellitus with other circulatory complications: Secondary | ICD-10-CM

## 2017-10-25 DIAGNOSIS — E559 Vitamin D deficiency, unspecified: Secondary | ICD-10-CM

## 2017-10-25 DIAGNOSIS — E039 Hypothyroidism, unspecified: Secondary | ICD-10-CM | POA: Diagnosis not present

## 2017-10-25 DIAGNOSIS — E1169 Type 2 diabetes mellitus with other specified complication: Secondary | ICD-10-CM | POA: Insufficient documentation

## 2017-10-25 DIAGNOSIS — R740 Nonspecific elevation of levels of transaminase and lactic acid dehydrogenase [LDH]: Secondary | ICD-10-CM | POA: Diagnosis not present

## 2017-10-25 DIAGNOSIS — Z6835 Body mass index (BMI) 35.0-35.9, adult: Secondary | ICD-10-CM | POA: Diagnosis not present

## 2017-10-25 DIAGNOSIS — E119 Type 2 diabetes mellitus without complications: Secondary | ICD-10-CM | POA: Insufficient documentation

## 2017-10-25 DIAGNOSIS — E118 Type 2 diabetes mellitus with unspecified complications: Secondary | ICD-10-CM | POA: Diagnosis not present

## 2017-10-25 DIAGNOSIS — E782 Mixed hyperlipidemia: Secondary | ICD-10-CM | POA: Diagnosis not present

## 2017-10-25 DIAGNOSIS — M6281 Muscle weakness (generalized): Secondary | ICD-10-CM | POA: Diagnosis not present

## 2017-10-25 DIAGNOSIS — R7401 Elevation of levels of liver transaminase levels: Secondary | ICD-10-CM

## 2017-10-25 DIAGNOSIS — M25511 Pain in right shoulder: Secondary | ICD-10-CM | POA: Diagnosis not present

## 2017-10-25 MED ORDER — METFORMIN HCL 500 MG PO TABS: ORAL_TABLET | ORAL | 2 refills | Status: DC

## 2017-10-25 NOTE — Patient Instructions (Addendum)
[name] please call in prescription for glucometer along with supplies to patient's pharmacy please.  Please write for just a generic 1 and they can choose whichever one is appropriate for patient's medical insurance.  -Please check your blood sugar on mornings when you eat especially bad the night before and especially healthy the night before so you can learn what makes her blood sugar go up and what makes your blood sugar go into normal range you need that negative and positive feedback. 3-4/wkly   Since LDL up- next OV we will start chol meds to help- lipitor.  But don't want to start 2 new meds at once today.   [name] is our referral clerk if you have not heard about your diabetic eye exam as well as the nutrition consultation within the next week please call and ask [name] about this.  -Start metformin for your diabetes.  Please make sure you eat a decent meal prior to taking medicines in the morning and evening.  Premier protein shakes and drinks,.muscle milk light  Low carb, high protein, low calorie-- go on all [email or web address]   Vit d3 2,000-5,000 IU daily     Diabetes Mellitus and Standards of Medical Care  Managing diabetes (diabetes mellitus) can be complicated. Your diabetes treatment may be managed by a team of health care providers, including:  A diet and nutrition specialist (registered dietitian).  A nurse.  A certified diabetes educator (CDE).  A diabetes specialist (endocrinologist).  An eye doctor.  A primary care provider.  A dentist.  Your health care providers follow a schedule in order to help you get the best quality of care. The following schedule is a general guideline for your diabetes management plan. Your health care providers may also give you more specific instructions.  HbA1c (hemoglobin A1c) test This test provides information about blood sugar (glucose) control over the previous 2-3 months. It is used to check whether your diabetes management plan  needs to be adjusted.  If you are meeting your treatment goals, this test is done at least 2 times a year.  If you are not meeting treatment goals or if your treatment goals have changed, this test is done 4 times a year.  Blood pressure test  This test is done at every routine medical visit. For most people, the goal is less than 130/80. Ask your health care provider what your goal blood pressure should be.  Dental and eye exams  Visit your dentist two times a year.  If you have type 1 diabetes, get an eye exam 3-5 years after you are diagnosed, and then once a year after your first exam. ? If you were diagnosed with type 1 diabetes as a child, get an eye exam when you are age 3 or older and have had diabetes for 3-5 years. After the first exam, you should get an eye exam once a year.  If you have type 2 diabetes, have an eye exam as soon as you are diagnosed, and then once a year after your first exam.  Foot care exam  Visual foot exams are done at every routine medical visit. The exams check for cuts, bruises, redness, blisters, sores, or other problems with the feet.  A complete foot exam is done by your health care provider once a year. This exam includes an inspection of the structure and skin of your feet, and a check of the pulses and sensation in your feet. ? Type 1 diabetes: Get your first exam  3-5 years after diagnosis. ? Type 2 diabetes: Get your first exam as soon as you are diagnosed.  Check your feet every day for cuts, bruises, redness, blisters, or sores. If you have any of these or other problems that are not healing, contact your health care provider.  Kidney function test (urine microalbumin)  This test is done once a year. ? Type 1 diabetes: Get your first test 5 years after diagnosis. ? Type 2 diabetes: Get your first test as soon as you are diagnosed._  If you have chronic kidney disease (CKD), get a serum creatinine and estimated glomerular filtration rate  (eGFR) test once a year.  Lipid profile (cholesterol, HDL, LDL, triglycerides)  This test should be done when you are diagnosed with diabetes, and every 5 years after the first test. If you are on medicines to lower your cholesterol, you may need to get this test done every year. ? The goal for LDL is less than 100 mg/dL (5.5 mmol/L). If you are at high risk, the goal is less than 70 mg/dL (3.9 mmol/L). ? The goal for HDL is 40 mg/dL (2.2 mmol/L) for men and 50 mg/dL(2.8 mmol/L) for women. An HDL cholesterol of 60 mg/dL (3.3 mmol/L) or higher gives some protection against heart disease. ? The goal for triglycerides is less than 150 mg/dL (8.3 mmol/L).  Immunizations  The yearly flu (influenza) vaccine is recommended for everyone 6 months or older who has diabetes.  The pneumonia (pneumococcal) vaccine is recommended for everyone 2 years or older who has diabetes. If you are 81 or older, you may get the pneumonia vaccine as a series of two separate shots.  The hepatitis B vaccine is recommended for adults shortly after they have been diagnosed with diabetes.  The Tdap (tetanus, diphtheria, and pertussis) vaccine should be given: ? According to normal childhood vaccination schedules, for children. ? Every 10 years, for adults who have diabetes.  The shingles vaccine is recommended for people who have had chicken pox and are 50 years or older.  Mental and emotional health  Screening for symptoms of eating disorders, anxiety, and depression is recommended at the time of diagnosis and afterward as needed. If your screening shows that you have symptoms (you have a positive screening result), you may need further evaluation and be referred to a mental health care provider.  Diabetes self-management education  Education about how to manage your diabetes is recommended at diagnosis and ongoing as needed.  Treatment plan  Your treatment plan will be reviewed at every medical  visit.  Summary  Managing diabetes (diabetes mellitus) can be complicated. Your diabetes treatment may be managed by a team of health care providers.  Your health care providers follow a schedule in order to help you get the best quality of care.  Standards of care including having regular physical exams, blood tests, blood pressure monitoring, immunizations, screening tests, and education about how to manage your diabetes.  Your health care providers may also give you more specific instructions based on your individual health.      Type 2 Diabetes Mellitus, Self Care, Adult Caring for yourself after you have been diagnosed with type 2 diabetes (type 2 diabetes mellitus) means keeping your blood sugar (glucose) under control with a balance of:  Nutrition.  Exercise.  Lifestyle changes.  Medicines or insulin, if necessary.  Support from your team of health care providers and others.  The following information explains what you need to know to manage your  diabetes at home. What do I need to do to manage my blood glucose?  Check your blood glucose every day, as often as told by your health care provider.  Contact your health care provider if your blood glucose is above your target for 2 tests in a row.  Have your A1c (hemoglobin A1c) level checked at least two times a year, or as often as told by your health care provider. Your health care provider will set individualized treatment goals for you. Generally, the goal of treatment is to maintain the following blood glucose levels:  Before meals (preprandial): 80-130 mg/dL (4.4-7.2 mmol/L).  After meals (postprandial): below 180 mg/dL (10 mmol/L).  A1c level: less than 7%.  What do I need to know about hyperglycemia and hypoglycemia? What is hyperglycemia? Hyperglycemia, also called high blood glucose, occurs when blood glucose is too high.Make sure you know the early signs of hyperglycemia, such as:  Increased  thirst.  Hunger.  Feeling very tired.  Needing to urinate more often than usual.  Blurry vision.  What is hypoglycemia? Hypoglycemia, also called low blood glucose, occurswith a blood glucose level at or below 70 mg/dL (3.9 mmol/L). The risk for hypoglycemia increases during or after exercise, during sleep, during illness, and when skipping meals or not eating for a long time (fasting). It is important to know the symptoms of hypoglycemia and treat it right away. Always have a 15-gram rapid-acting carbohydrate snack with you to treat low blood glucose. Family members and close friends should also know the symptoms and should understand how to treat hypoglycemia, in case you are not able to treat yourself. What are the symptoms of hypoglycemia? Hypoglycemia symptoms can include:  Hunger.  Anxiety.  Sweating and feeling clammy.  Confusion.  Dizziness or feeling light-headed.  Sleepiness.  Nausea.  Increased heart rate.  Headache.  Blurry vision.  Seizure.  Nightmares.  Tingling or numbness around the mouth, lips, or tongue.  A change in speech.  Decreased ability to concentrate.  A change in coordination.  Restless sleep.  Tremors or shakes.  Fainting.  Irritability.  How do I treat hypoglycemia?  If you are alert and able to swallow safely, follow the 15:15 rule:  Take 15 grams of a rapid-acting carbohydrate. Rapid-acting options include: ? 1 tube of glucose gel. ? 3 glucose pills. ? 6-8 pieces of hard candy. ? 4 oz (120 mL) of fruit juice. ? 4 oz (120 mL) of regular (not diet) soda.  Check your blood glucose 15 minutes after you take the carbohydrate.  If the repeat blood glucose level is still at or below 70 mg/dL (3.9 mmol/L), take 15 grams of a carbohydrate again.  If your blood glucose level does not increase above 70 mg/dL (3.9 mmol/L) after 3 tries, seek emergency medical care.  After your blood glucose level returns to normal, eat a  meal or a snack within 1 hour.  How do I treat severe hypoglycemia? Severe hypoglycemia is when your blood glucose level is at or below 54 mg/dL (3 mmol/L). Severe hypoglycemia is an emergency. Do not wait to see if the symptoms will go away. Get medical help right away. Call your local emergency services (911 in the [place] Do not drive yourself to the hospital. If you have severe hypoglycemia and you cannot eat or drink, you may need an injection of glucagon. A family member or close friend should learn how to check your blood glucose and how to give you a glucagon injection. Ask  your health care provider if you need to have an emergency glucagon injection kit available. Severe hypoglycemia may need to be treated in a hospital. The treatment may include getting glucose through an IV tube. You may also need treatment for the cause of your hypoglycemia. Can having diabetes put me at risk for other conditions? Having diabetes can put you at risk for other long-term (chronic) conditions, such as heart disease and kidney disease. Your health care provider may prescribe medicines to help prevent complications from diabetes. These medicines may include:  Aspirin.  Medicine to lower cholesterol.  Medicine to control blood pressure.  What else can I do to manage my diabetes? Take your diabetes medicines as told  If your health care provider prescribed insulin or diabetes medicines, take them every day.  Do not run out of insulin or other diabetes medicines that you take. Plan ahead so you always have these available.  If you use insulin, adjust your dosage based on how physically active you are and what foods you eat. Your health care provider will tell you how to adjust your dosage. Make healthy food choices  The things that you eat and drink affect your blood glucose and your insulin dosage. Making good choices helps to control your diabetes and prevent other health problems. A healthy meal plan  includes eating lean proteins, complex carbohydrates, fresh fruits and vegetables, low-fat dairy products, and healthy fats. Make an appointment to see a diet and nutrition specialist (registered dietitian) to help you create an eating plan that is right for you. Make sure that you:  Follow instructions from your health care provider about eating or drinking restrictions.  Drink enough fluid to keep your urine clear or pale yellow.  Eat healthy snacks between nutritious meals.  Track the carbohydrates that you eat. Do this by reading food labels and learning the standard serving sizes of foods.  Follow your sick day plan whenever you cannot eat or drink as usual. Make this plan in advance with your health care provider.  Stay active  Exercise regularly, as told by your health care provider. This may include:  Stretching and doing strength exercises, such as yoga or weightlifting, at least 2 times a week.  Doing at least 150 minutes of moderate-intensity or vigorous-intensity exercise each week. This could be brisk walking, biking, or water aerobics. ? Spread out your activity over at least 3 days of the week. ? Do not go more than 2 days in a row without doing some kind of physical activity.  When you start a new exercise or activity, work with your health care provider to adjust your insulin, medicines, or food intake as needed. Make healthy lifestyle choices  Do not use any tobacco products, such as cigarettes, chewing tobacco, and e-cigarettes. If you need help quitting, ask your health care provider.  If your health care provider says that alcohol is safe for you, limit alcohol intake to no more than 1 drink per day for nonpregnant women and 2 drinks per day for men. One drink equals 12 oz of beer, 5 oz of wine, or 1 oz of hard liquor.  Learn to manage stress. If you need help with this, ask your health care provider. Care for your body   Keep your immunizations up to date. In  addition to getting vaccinations as told by your health care provider, it is recommended that you get vaccinated against the following illnesses: ? The flu (influenza). Get a flu shot every  year. ? Pneumonia. ? Hepatitis B.  Schedule an eye exam soon after your diagnosis, and then one time every year after that.  Check your skin and feet every day for cuts, bruises, redness, blisters, or sores. Schedule a foot exam with your health care provider once every year.  Brush your teeth and gums two times a day, and floss at least one time a day. Visit your dentist at least once every 6 months.  Maintain a healthy weight. General instructions  Take over-the-counter and prescription medicines only as told by your health care provider.  Share your diabetes management plan with people in your workplace, school, and household.  Check your urine for ketones when you are ill and as told by your health care provider.  Ask your health care provider: ? Do I need to meet with a diabetes educator? ? Where can I find a support group for people with diabetes?  Carry a medical alert card or wear medical alert jewelry.  Keep all follow-up visits as told by your health care provider. This is important. Where to find more information: For more information about diabetes, visit:  American Diabetes Association (ADA): www.diabetes.org  American Association of Diabetes Educators (AADE): www.diabeteseducator.org/patient-resources  This information is not intended to replace advice given to you by your health care provider. Make sure you discuss any questions you have with your health care provider. Document Released: 02/03/2016 Document Revised: 03/19/2016 Document Reviewed: 11/15/2015 Elsevier Interactive Patient Education  2017 [place].      Blood Glucose Monitoring, Adult Monitoring your blood sugar (glucose) helps you manage your diabetes. It also helps you and your health care provider  determine how well your diabetes management plan is working. Blood glucose monitoring involves checking your blood glucose as often as directed, and keeping a record (log) of your results over time. Why should I monitor my blood glucose? Checking your blood glucose regularly can:  Help you understand how food, exercise, illnesses, and medicines affect your blood glucose.  Let you know what your blood glucose is at any time. You can quickly tell if you are having low blood glucose (hypoglycemia) or high blood glucose (hyperglycemia).  Help you and your health care provider adjust your medicines as needed.  When should I check my blood glucose? Follow instructions from your health care provider about how often to check your blood glucose.   This may depend on:  The type of diabetes you have.  How well-controlled your diabetes is.  Medicines you are taking.  If you have type 1 diabetes:  Check your blood glucose at least 2 times a day.  Also check your blood glucose: ? Before every insulin injection. ? Before and after exercise. ? Between meals. ? 2 hours after a meal. ? Occasionally between 2:00 a.m. and 3:00 a.m., as directed. ? Before potentially dangerous tasks, like driving or using heavy machinery. ? At bedtime.  You may need to check your blood glucose more often, up to 6-10 times a day: ? If you use an insulin pump. ? If you need multiple daily injections (MDI). ? If your diabetes is not well-controlled. ? If you are ill. ? If you have a history of severe hypoglycemia. ? If you have a history of not knowing when your blood glucose is getting low (hypoglycemia unawareness).  If you have type 2 diabetes:  If you take insulin or other diabetes medicines, check your blood glucose at least 2 times a day.  If you are  on intensive insulin therapy, check your blood glucose at least 4 times a day. Occasionally, you may also need to check between 2:00 a.m. and 3:00 a.m., as  directed.  Also check your blood glucose: ? Before and after exercise. ? Before potentially dangerous tasks, like driving or using heavy machinery.  You may need to check your blood glucose more often if: ? Your medicine is being adjusted. ? Your diabetes is not well-controlled. ? You are ill.  What is a blood glucose log?  A blood glucose log is a record of your blood glucose readings. It helps you and your health care provider: ? Look for patterns in your blood glucose over time. ? Adjust your diabetes management plan as needed.  Every time you check your blood glucose, write down your result and notes about things that may be affecting your blood glucose, such as your diet and exercise for the day.  Most glucose meters store a record of glucose readings in the meter. Some meters allow you to download your records to a computer. How do I check my blood glucose? Follow these steps to get accurate readings of your blood glucose: Supplies needed   Blood glucose meter.  Test strips for your meter. Each meter has its own strips. You must use the strips that come with your meter.  A needle to prick your finger (lancet). Do not use lancets more than once.  A device that holds the lancet (lancing device).  A journal or log book to write down your results.  Procedure  Wash your hands with soap and water.  Prick the side of your finger (not the tip) with the lancet. Use a different finger each time.  Gently rub the finger until a small drop of blood appears.  Follow instructions that come with your meter for inserting the test strip, applying blood to the strip, and using your blood glucose meter.  Write down your result and any notes.  Alternative testing sites  Some meters allow you to use areas of your body other than your finger (alternative sites) to test your blood.  If you think you may have hypoglycemia, or if you have hypoglycemia unawareness, do not use  alternative sites. Use your finger instead.  Alternative sites may not be as accurate as the fingers, because blood flow is slower in these areas. This means that the result you get may be delayed, and it may be different from the result that you would get from your finger.  The most common alternative sites are: ? Forearm. ? Thigh. ? Palm of the hand.  Additional tips  Always keep your supplies with you.  If you have questions or need help, all blood glucose meters have a 24-hour "hotline" number that you can call. You may also contact your health care provider.  After you use a few boxes of test strips, adjust (calibrate) your blood glucose meter by following instructions that came with your meter.    The American Diabetes Association suggests the following targets for most nonpregnant adults with diabetes.  More or less stringent glycemic goals may be appropriate for each individual.  A1C: Less than 7% A1C may also be reported as eAG: Less than 154 mg/dl Before a meal (preprandial plasma glucose): 80-130 mg/dl 1-2 hours after beginning of the meal (Postprandial plasma glucose)*: Less than 180 mg/dl  *Postprandial glucose may be targeted if A1C goals are not met despite reaching preprandial glucose goals.   GOALS in short:  The goals are for the Hgb A1C to be less than 7.0 & blood pressure to be less than 130/80.    It is recommended that all diabetics are educated on and follow a healthy diabetic diet, exercise for 30 minutes 3-4 times per week (walking, biking, swimming, or machine), monitor blood glucose readings and bring that record with you to be reviewed at your next office visit.     You should be checking fasting blood sugars- especially after you eat poorly or eat really healthy, and also check 2 hour postprandial blood sugars after largest meal of the day.    Write these down and bring in your log at each office visit.    You will need to be seen every 3 months by  the provider managing your Diabetes unless told otherwise by that provider.   You will need yearly eye exams from an eye specialist and foot exams to check the nerves of your feet.  Also, your urine should be checked yearly as well to make sure excess protein is not present.   If you are checking your blood pressure at home, please record it and bring it to your next office visit.    Follow the Dietary Approaches to Stop Hypertension (DASH) diet (3 servings of fruit and vegetables daily, whole grains, low sodium, low-fat proteins).  See below.    Lastly, when it comes to your cholesterol, the goal is to have the HDL (good cholesterol) >40, and the LDL (bad cholesterol) <100.   It is recommended that you follow a heart healthy, low saturated and trans-fat diet and exercise for 30 minutes at least 5 times a week.     (( Check out the DASH diet = 1.5 Gram Low Sodium Diet   A 1.5 gram sodium diet restricts the amount of sodium in the diet to no more than 1.5 g or 1500 mg daily.  The American Heart Association recommends Americans over the age of 33 to consume no more than 1500 mg of sodium each day to reduce the risk of developing high blood pressure.  Research also shows that limiting sodium may reduce heart attack and stroke risk.  Many foods contain sodium for flavor and sometimes as a preservative.  When the amount of sodium in a diet needs to be low, it is important to know what to look for when choosing foods and drinks.  The following includes some information and guidelines to help make it easier for you to adapt to a low sodium diet.    QUICK TIPS  Do not add salt to food.  Avoid convenience items and fast food.  Choose unsalted snack foods.  Buy lower sodium products, often labeled as "lower sodium" or "no salt added."  Check food labels to learn how much sodium is in 1 serving.  When eating at a restaurant, ask that your food be prepared with less salt or none, if possible.    READING  FOOD LABELS FOR SODIUM INFORMATION  The nutrition facts label is a good place to find how much sodium is in foods. Look for products with no more than 400 mg of sodium per serving.  Remember that 1.5 g = 1500 mg.  The food label may also list foods as:  Sodium-free: Less than 5 mg in a serving.  Very low sodium: 35 mg or less in a serving.  Low-sodium: 140 mg or less in a serving.  Light in sodium: 50% less sodium in a serving.  For example, if a food that usually has 300 mg of sodium is changed to become light in sodium, it will have 150 mg of sodium.  Reduced sodium: 25% less sodium in a serving. For example, if a food that usually has 400 mg of sodium is changed to reduced sodium, it will have 300 mg of sodium.    CHOOSING FOODS  Grains  Avoid: Salted crackers and snack items. Some cereals, including instant hot cereals. Bread stuffing and biscuit mixes. Seasoned rice or pasta mixes.  Choose: Unsalted snack items. Low-sodium cereals, oats, puffed wheat and rice, shredded wheat. English muffins and bread. Pasta.  Meats  Avoid: Salted, canned, smoked, spiced, pickled meats, including fish and poultry. Bacon, ham, sausage, cold cuts, hot dogs, anchovies.  Choose: Low-sodium canned tuna and salmon. Fresh or frozen meat, poultry, and fish.  Dairy  Avoid: Processed cheese and spreads. Cottage cheese. Buttermilk and condensed milk. Regular cheese.  Choose: Milk. Low-sodium cottage cheese. Yogurt. Sour cream. Low-sodium cheese.  Fruits and Vegetables  Avoid: Regular canned vegetables. Regular canned tomato sauce and paste. Frozen vegetables in sauces. Olives. Angie Fava. Relishes. Sauerkraut.  Choose: Low-sodium canned vegetables. Low-sodium tomato sauce and paste. Frozen or fresh vegetables. Fresh and frozen fruit.  Condiments  Avoid: Canned and packaged gravies. Worcestershire sauce. Tartar sauce. Barbecue sauce. Soy sauce. Steak sauce. Ketchup. Onion, garlic, and table salt. Meat flavorings and  tenderizers.  Choose: Fresh and dried herbs and spices. Low-sodium varieties of mustard and ketchup. Lemon juice. Tabasco sauce. Horseradish.    SAMPLE 1.5 GRAM SODIUM MEAL PLAN:   Breakfast / Sodium (mg)  1 cup low-fat milk / 143 mg  1 whole-wheat English muffin / 240 mg  1 tbs heart-healthy margarine / 153 mg  1 hard-boiled egg / 139 mg  1 small orange / 0 mg  Lunch / Sodium (mg)  1 cup raw carrots / 76 mg  2 tbs no salt added peanut butter / 5 mg  2 slices whole-wheat bread / 270 mg  1 tbs jelly / 6 mg   cup red grapes / 2 mg  Dinner / Sodium (mg)  1 cup whole-wheat pasta / 2 mg  1 cup low-sodium tomato sauce / 73 mg  3 oz lean ground beef / 57 mg  1 small side salad (1 cup raw spinach leaves,  cup cucumber,  cup yellow bell pepper) with 1 tsp olive oil and 1 tsp red wine vinegar / 25 mg  Snack / Sodium (mg)  1 container low-fat vanilla yogurt / 107 mg  3 graham cracker squares / 127 mg  Nutrient Analysis  Calories: 1745  Protein: 75 g  Carbohydrate: 237 g  Fat: 57 g  Sodium: 1425 mg  Document Released: 10/12/2005 Document Revised: 06/24/2011 Document Reviewed: 01/13/2010  ExitCare Patient Information 2012 [place].))    This information is not intended to replace advice given to you by your health care provider. Make sure you discuss any questions you have with your health care provider. Document Released: 10/15/2003 Document Revised: 05/01/2016 Document Reviewed: 03/23/2016 Elsevier Interactive Patient Education  2017 [employer].

## 2017-10-25 NOTE — Progress Notes (Signed)
Assessment and plan:  1. Type 2 diabetes mellitus with complication, without long-term current use of insulin ([name])   2. Hypertension associated with diabetes ([name])   3. Mixed diabetic hyperlipidemia ( Inc LDL currently) associated with type 2 diabetes mellitus ([name])   4. Severe obesity (BMI 35.0-35.9 with comorbidity) ([name])   5. Elevated alanine aminotransferase (ALT) level   6. Hypothyroidism, unspecified type   7. h/o Vitamin D deficiency- requires supp    - Counseled patient on pathophysiology of disease and discussed various treatment options, which includes dietary and lifestyle modifications as first line!  Importance of low carb/ketogenic diet discussed with patient in addition to regular exercise.   - Check FBS and 2 hours after the biggest meal of your day.  Keep log and bring in next OV for my review.   Also, if you ever feel poorly, please check your blood pressure and blood sugar, as one or the other could be the cause of your symptoms.  - Being a diabetic, you need yearly eye and foot exams.  Referral for DM eye exam placed today.  Told pt: [name] is our referral clerk if you have not heard about your diabetic eye exam as well as the nutrition consultation within the next week please call and ask [name] about this.  - Referral to Diabetic Nutritionist done today   - Start metformin for your diabetes.  Please make sure you eat a decent meal prior to taking medicines in the morning and evening.  Risks and benefits of medications discussed with patient, including alternative treatments.   Encouraged patient to read drug information handouts to further educate self about the medicine prior to starting it. Contact us prior with any questions/ concerns.  - I asked my CMA, [name] to send in glucometer and supplies to pt's pharmacy today after her OV  -told pt check your blood sugar on mornings when you eat especially  bad the night before and especially healthy the night before so you can learn what makes her blood sugar go up and what makes your blood sugar go into normal range you need that negative and positive feedback. 3-4/wkly   Since LDL up- next OV we will start chol meds to help- lipitor.  But don't want to start 2 new meds at once today.   Premier protein shakes and drinks,.muscle milk light and lots of other healthier food choices d/c pt in detail today  Low carb, high protein, low calorie-- go on all [email or web address]   Pt will take Vit d3 OTC 2,000-5,000 IU daily  Extensive d/c pt re wt loss, use of lose it app, reg exercise etc   Education and routine counseling performed. Handouts provided.  Pt was in the office today for 40+ minutes, with over 50% time spent in face to face counseling of patients various medical conditions, treatment plans of those medical conditions including medicine management and lifestyle modification, strategies to improve health and well being; and in coordination of care. SEE ABOVE FOR DETAILS  Meds ordered this encounter  Medications  . metFORMIN (GLUCOPHAGE) 500 MG tablet    Sig: 1 tablet twice daily for 4 days, if well tolerated go to 2 tablets twice daily    Dispense:  180 tablet    Refill:  2    Orders Placed This Encounter  Procedures  . Microalbumin / creatinine urine ratio  . Amb Referral to Nutrition and Diabetic E  . Ambulatory referral to Ophthalmology  Return in about 4 weeks (around 11/22/2017) for Since patient new onset diabetic and started metformin.  Will need to start Lipitor next OV.   Anticipatory guidance and routine counseling done re: condition, txmnt options and need for follow up. All questions of patient's were answered.   Gross side effects, risk and benefits, and alternatives of medications discussed with patient.  Patient is aware that all medications have potential side effects and we are unable to predict every sideeffect or  drug-drug interaction that may occur.  Expresses verbal understanding and consents to current therapy plan and treatment regiment.  Please see AVS handed out to patient at the end of our visit for additional patient instructions/ counseling done pertaining to today's office visit.  Note: This document was prepared using Dragon voice recognition software and may include unintentional dictation errors.  I have reviewed the above documentation for accuracy and completeness, and I agree with the above.   [name] 10/30/17 1:13 PM  ----------------------------------------------------------------------------------------------------------------------  Subjective:   CC:   [name] is a 51 y.o. female who presents to [facility] today for review and discussion of recent bloodwork that was done.  1. All recent blood work that we ordered was reviewed with patient today.  Patient was counseled on all abnormalities and we discussed dietary and lifestyle changes that could help those values (also medications when appropriate).  Extensive health counseling performed and all patient's concerns/ questions were addressed.  2. Pt with h/o pre-Dm, HLD prior to her most recent bldwrk.  3. Now-  NEW ONSET DM, HTN assoc w DM,  HLD assoc with DM.  Pt has no complaints today, just a lot of concerns about health implicaitons of all these conditions    Wt Readings from Last 3 Encounters:  10/25/17 230 lb (104.3 kg)  10/11/17 231 lb (104.8 kg)  09/20/17 231 lb 11.2 oz (105.1 kg)   BP Readings from Last 3 Encounters:  10/25/17 114/79  10/11/17 128/87  09/20/17 125/81   Pulse Readings from Last 3 Encounters:  10/25/17 90  10/11/17 (!) 59  09/20/17 65   BMI Readings from Last 3 Encounters:  10/25/17 34.97 kg/m  10/11/17 35.12 kg/m  09/20/17 35.23 kg/m     Patient Care Team    Relationship Specialty Notifications Start End  Heidi Dance, DO PCP -  General Family Medicine  08/13/17   Roseanne Kaufman, MD Consulting Physician Orthopedic Surgery  09/20/17   Doran Stabler, MD Consulting Physician Gastroenterology  09/20/17    Comment: Getting first colonoscopy in this October 11 2017  Huel Cote, NP Nurse Practitioner Obstetrics and Gynecology  09/20/17     Full medical history updated and reviewed in the office today  Patient Active Problem List   Diagnosis Date Noted  . Mixed diabetic hyperlipidemia associated with type 2 diabetes mellitus ([place]) 10/25/2017    Priority: High  . Hypertension associated with diabetes ([place]) 10/25/2017    Priority: High  . Diabetes mellitus, type II ([place]) 10/25/2017    Priority: High  . Severe obesity (BMI 35.0-35.9 with comorbidity) ([place]) 09/20/2017    Priority: High  . Elevated alanine aminotransferase (ALT) level 09/20/2017    Priority: Medium  . Low HDL (under 40) 09/20/2017    Priority: Medium  . Hypothyroidism 02/08/2013    Priority: Medium  . H/O total hysterectomy- benign reasons 09/20/2017    Priority: Low  . Family history of breast cancer in mother-deceased late  50s 09/20/2017    Priority: Low  . Family history of colon cancer- MGF- in 60's 09/20/2017    Priority: Low  . Vitamin D deficiency 09/20/2017    Priority: Low  . Family history of diabetes mellitus (DM)- in daughter 09/20/2017    Priority: Low  . Left knee pain- Dr Amedeo Plenty 09/20/2017  . Perimenopausal 09/20/2017  . Blood glucose elevated 09/20/2017    Past Medical History:  Diagnosis Date  . Acute meniscal tear of left knee   . History of hypothyroidism   . Prediabetes 09/20/2017   Elevated blood glucose since 2013 possibly prior.    Past Surgical History:  Procedure Laterality Date  . [place] ENDOMETRIAL ABLATION  03-16-2007  . KNEE ARTHROSCOPY Right   . KNEE ARTHROSCOPY WITH MEDIAL MENISECTOMY Left 05/17/2015   Procedure: LEFT KNEE ARTHROSCOPY WITH MEDIAL MENISECTOMY,  CHONDROPLASTY PATELLA;  Surgeon: Latanya Maudlin, MD;  Location: [facility];  Service: Orthopedics;  Laterality: Left;  . LAPAROSOCPY BILATERAL TUBAL LIGATION W/ CAUTERIZATION  10-15-2000  . VAGINAL HYSTERECTOMY  01/25/2012   Procedure: HYSTERECTOMY VAGINAL;  Surgeon: Anastasio Auerbach, MD;  Location: [facility];  Service: Gynecology;  Laterality: N/A;    Social History   Tobacco Use  . Smoking status: Never Smoker  . Smokeless tobacco: Never Used  Substance Use Topics  . Alcohol use: No    Alcohol/week: 0.0 oz    Family Hx: Family History  Problem Relation Age of Onset  . Hypertension Mother   . Cancer Mother        LUG, THROAT, & BACK  . Breast cancer Mother   . Cancer Father        THROAT  . Throat cancer Father   . Cancer Maternal Grandmother        KIDNEY  . Cancer Maternal Grandfather        COLON  . Colon cancer Maternal Grandfather 12  . Stomach cancer Neg Hx      Medications: Current Outpatient Medications  Medication Sig Dispense Refill  . metFORMIN (GLUCOPHAGE) 500 MG tablet 1 tablet twice daily for 4 days, if well tolerated go to 2 tablets twice daily 180 tablet 2   Current Facility-Administered Medications  Medication Dose Route Frequency Provider Last Rate Last Dose  . 0.9 %  sodium chloride infusion  500 mL Intravenous Once Doran Stabler, MD        Allergies:  Allergies  Allergen Reactions  . Adhesive [Tape] Rash     Review of Systems: General:   No F/C, wt loss Pulm:   No DIB, SOB, pleuritic chest pain Card:  No CP, palpitations Abd:  No n/v/d or pain Ext:  No inc edema from baseline  Objective:  Blood pressure 114/79, pulse 90, height 5\' 8"  (1.727 m), weight 230 lb (104.3 kg), last menstrual period 01/03/2012, SpO2 98 %. Body mass index is 34.97 kg/m. Gen:   Well NAD, A and O *3 HEENT:    [place]/AT, EOMI,  MMM Lungs:   Normal work of breathing. CTA B/L, no Wh, rhonchi Heart:   RRR, S1, S2 WNL's, no MRG Abd:   No gross  distention Exts:    warm, pink,  Brisk capillary refill, warm and well perfused.  Psych:    No HI/SI, judgement and insight good, Euthymic mood. Full Affect.   Recent Results (from the past 2160 hour(s))  VITAMIN D 25 Hydroxy (Vit-D Deficiency, Fractures)     Status: None   Collection  Time: 10/12/17  8:45 AM  Result Value Ref Range   Vit D, 25-Hydroxy 37.4 30.0 - 100.0 ng/mL    Comment: Vitamin D deficiency has been defined by the [place] practice guideline as a level of serum 25-OH vitamin D less than 20 ng/mL (1,2). The Endocrine Society went on to further define vitamin D insufficiency as a level between 21 and 29 ng/mL (2). 1. IOM (Institute of Medicine). 2010. Dietary reference    intakes for calcium and D. [place]: The    [employer]. 2. Holick MF, Binkley Braymer, Bischoff-Ferrari HA, et al.    Evaluation, treatment, and prevention of vitamin D    deficiency: an Endocrine Society clinical practice    guideline. JCEM. 2011 Jul; 96(7):1911-30.   TSH     Status: None   Collection Time: 10/12/17  8:45 AM  Result Value Ref Range   TSH 3.560 0.450 - 4.500 uIU/mL  T4, free     Status: None   Collection Time: 10/12/17  8:45 AM  Result Value Ref Range   Free T4 1.17 0.82 - 1.77 ng/dL  T3, free     Status: None   Collection Time: 10/12/17  8:45 AM  Result Value Ref Range   T3, Free 2.9 2.0 - 4.4 pg/mL  Hemoglobin A1c     Status: Abnormal   Collection Time: 10/12/17  8:45 AM  Result Value Ref Range   Hgb A1c MFr Bld 7.2 (H) 4.8 - 5.6 %    Comment:          Prediabetes: 5.7 - 6.4          Diabetes: >6.4          Glycemic control for adults with diabetes: <7.0    Est. average glucose Bld gHb Est-mCnc 160 mg/dL  Lipid panel     Status: Abnormal   Collection Time: 10/12/17  8:45 AM  Result Value Ref Range   Cholesterol, Total 190 100 - 199 mg/dL   Triglycerides 106 0 - 149 mg/dL   HDL 41 >39 mg/dL   VLDL Cholesterol Cal 21  5 - 40 mg/dL   LDL Calculated 128 (H) 0 - 99 mg/dL   Chol/HDL Ratio 4.6 (H) 0.0 - 4.4 ratio    Comment:                                   T. Chol/HDL Ratio                                             Men  Women                               1/2 Avg.Risk  3.4    3.3                                   Avg.Risk  5.0    4.4                                2X Avg.Risk  9.6    7.1  3X Avg.Risk 23.4   11.0   Comprehensive metabolic panel     Status: Abnormal   Collection Time: 10/12/17  8:45 AM  Result Value Ref Range   Glucose 161 (H) 65 - 99 mg/dL   BUN 13 6 - 24 mg/dL   Creatinine, Ser 0.98 0.57 - 1.00 mg/dL   GFR calc non Af Amer 67 >59 mL/min/1.73   GFR calc Af Amer 78 >59 mL/min/1.73   BUN/Creatinine Ratio 13 9 - 23   Sodium 136 134 - 144 mmol/L   Potassium 4.7 3.5 - 5.2 mmol/L   Chloride 101 96 - 106 mmol/L   CO2 25 20 - 29 mmol/L   Calcium 9.5 8.7 - 10.2 mg/dL   Total Protein 7.3 6.0 - 8.5 g/dL   Albumin 4.3 3.5 - 5.5 g/dL   Globulin, Total 3.0 1.5 - 4.5 g/dL   Albumin/Globulin Ratio 1.4 1.2 - 2.2   Bilirubin Total 0.3 0.0 - 1.2 mg/dL   Alkaline Phosphatase 88 39 - 117 IU/L   AST 27 0 - 40 IU/L   ALT 53 (H) 0 - 32 IU/L  CBC with Differential/Platelet     Status: None   Collection Time: 10/12/17  8:45 AM  Result Value Ref Range   WBC 4.6 3.4 - 10.8 x10E3/uL   RBC 4.69 3.77 - 5.28 x10E6/uL   Hemoglobin 13.4 11.1 - 15.9 g/dL   Hematocrit 39.8 34.0 - 46.6 %   MCV 85 79 - 97 fL   MCH 28.6 26.6 - 33.0 pg   MCHC 33.7 31.5 - 35.7 g/dL   RDW 13.4 12.3 - 15.4 %   Platelets 261 150 - 379 x10E3/uL   Neutrophils 43 Not Estab. %   Lymphs 47 Not Estab. %   Monocytes 7 Not Estab. %   Eos 2 Not Estab. %   Basos 0 Not Estab. %   Neutrophils Absolute 2.0 1.4 - 7.0 x10E3/uL   Lymphocytes Absolute 2.2 0.7 - 3.1 x10E3/uL   Monocytes Absolute 0.3 0.1 - 0.9 x10E3/uL   EOS (ABSOLUTE) 0.1 0.0 - 0.4 x10E3/uL   Basophils Absolute 0.0 0.0 - 0.2 x10E3/uL    Immature Granulocytes 1 Not Estab. %   Immature Grans (Abs) 0.0 0.0 - 0.1 x10E3/uL

## 2017-10-27 DIAGNOSIS — M25611 Stiffness of right shoulder, not elsewhere classified: Secondary | ICD-10-CM | POA: Diagnosis not present

## 2017-10-27 DIAGNOSIS — M25511 Pain in right shoulder: Secondary | ICD-10-CM | POA: Diagnosis not present

## 2017-10-27 DIAGNOSIS — M6281 Muscle weakness (generalized): Secondary | ICD-10-CM | POA: Diagnosis not present

## 2017-10-29 DIAGNOSIS — M25611 Stiffness of right shoulder, not elsewhere classified: Secondary | ICD-10-CM | POA: Diagnosis not present

## 2017-10-29 DIAGNOSIS — M25511 Pain in right shoulder: Secondary | ICD-10-CM | POA: Diagnosis not present

## 2017-10-29 DIAGNOSIS — M6281 Muscle weakness (generalized): Secondary | ICD-10-CM | POA: Diagnosis not present

## 2017-11-01 DIAGNOSIS — M6281 Muscle weakness (generalized): Secondary | ICD-10-CM | POA: Diagnosis not present

## 2017-11-01 DIAGNOSIS — M25511 Pain in right shoulder: Secondary | ICD-10-CM | POA: Diagnosis not present

## 2017-11-01 DIAGNOSIS — M25611 Stiffness of right shoulder, not elsewhere classified: Secondary | ICD-10-CM | POA: Diagnosis not present

## 2017-11-04 DIAGNOSIS — M25611 Stiffness of right shoulder, not elsewhere classified: Secondary | ICD-10-CM | POA: Diagnosis not present

## 2017-11-04 DIAGNOSIS — M6281 Muscle weakness (generalized): Secondary | ICD-10-CM | POA: Diagnosis not present

## 2017-11-04 DIAGNOSIS — M25511 Pain in right shoulder: Secondary | ICD-10-CM | POA: Diagnosis not present

## 2017-11-08 DIAGNOSIS — M6281 Muscle weakness (generalized): Secondary | ICD-10-CM | POA: Diagnosis not present

## 2017-11-08 DIAGNOSIS — M25511 Pain in right shoulder: Secondary | ICD-10-CM | POA: Diagnosis not present

## 2017-11-08 DIAGNOSIS — M25611 Stiffness of right shoulder, not elsewhere classified: Secondary | ICD-10-CM | POA: Diagnosis not present

## 2017-11-10 DIAGNOSIS — M25511 Pain in right shoulder: Secondary | ICD-10-CM | POA: Diagnosis not present

## 2017-11-10 DIAGNOSIS — M6281 Muscle weakness (generalized): Secondary | ICD-10-CM | POA: Diagnosis not present

## 2017-11-10 DIAGNOSIS — M25611 Stiffness of right shoulder, not elsewhere classified: Secondary | ICD-10-CM | POA: Diagnosis not present

## 2017-11-15 DIAGNOSIS — M25611 Stiffness of right shoulder, not elsewhere classified: Secondary | ICD-10-CM | POA: Diagnosis not present

## 2017-11-15 DIAGNOSIS — M6281 Muscle weakness (generalized): Secondary | ICD-10-CM | POA: Diagnosis not present

## 2017-11-15 DIAGNOSIS — M25511 Pain in right shoulder: Secondary | ICD-10-CM | POA: Diagnosis not present

## 2017-11-17 ENCOUNTER — Ambulatory Visit: Payer: 59 | Admitting: *Deleted

## 2017-11-18 DIAGNOSIS — M25611 Stiffness of right shoulder, not elsewhere classified: Secondary | ICD-10-CM | POA: Diagnosis not present

## 2017-11-18 DIAGNOSIS — M6281 Muscle weakness (generalized): Secondary | ICD-10-CM | POA: Diagnosis not present

## 2017-11-18 DIAGNOSIS — M25511 Pain in right shoulder: Secondary | ICD-10-CM | POA: Diagnosis not present

## 2017-11-22 ENCOUNTER — Ambulatory Visit (INDEPENDENT_AMBULATORY_CARE_PROVIDER_SITE_OTHER): Payer: 59 | Admitting: Family Medicine

## 2017-11-22 ENCOUNTER — Other Ambulatory Visit: Payer: Self-pay | Admitting: Family Medicine

## 2017-11-22 ENCOUNTER — Encounter: Payer: Self-pay | Admitting: Family Medicine

## 2017-11-22 VITALS — BP 131/82 | HR 70 | Ht 68.0 in | Wt 234.4 lb

## 2017-11-22 DIAGNOSIS — I10 Essential (primary) hypertension: Secondary | ICD-10-CM

## 2017-11-22 DIAGNOSIS — I152 Hypertension secondary to endocrine disorders: Secondary | ICD-10-CM

## 2017-11-22 DIAGNOSIS — E1169 Type 2 diabetes mellitus with other specified complication: Secondary | ICD-10-CM

## 2017-11-22 DIAGNOSIS — E1159 Type 2 diabetes mellitus with other circulatory complications: Secondary | ICD-10-CM | POA: Diagnosis not present

## 2017-11-22 DIAGNOSIS — E118 Type 2 diabetes mellitus with unspecified complications: Secondary | ICD-10-CM

## 2017-11-22 DIAGNOSIS — R740 Nonspecific elevation of levels of transaminase and lactic acid dehydrogenase [LDH]: Secondary | ICD-10-CM

## 2017-11-22 DIAGNOSIS — M25611 Stiffness of right shoulder, not elsewhere classified: Secondary | ICD-10-CM | POA: Diagnosis not present

## 2017-11-22 DIAGNOSIS — Z6835 Body mass index (BMI) 35.0-35.9, adult: Secondary | ICD-10-CM

## 2017-11-22 DIAGNOSIS — M25511 Pain in right shoulder: Secondary | ICD-10-CM | POA: Diagnosis not present

## 2017-11-22 DIAGNOSIS — R7401 Elevation of levels of liver transaminase levels: Secondary | ICD-10-CM

## 2017-11-22 DIAGNOSIS — E782 Mixed hyperlipidemia: Secondary | ICD-10-CM | POA: Diagnosis not present

## 2017-11-22 DIAGNOSIS — M6281 Muscle weakness (generalized): Secondary | ICD-10-CM | POA: Diagnosis not present

## 2017-11-22 MED ORDER — BLOOD GLUCOSE MONITOR KIT: PACK | 0 refills | Status: DC

## 2017-11-22 MED ORDER — LANCET DEVICES MISC
6 refills | Status: DC
Start: 2017-11-22 — End: 2018-11-08

## 2017-11-22 MED ORDER — METFORMIN HCL 500 MG PO TABS: ORAL_TABLET | ORAL | 3 refills | Status: DC

## 2017-11-22 MED ORDER — GLUCOSE BLOOD VI STRP: ORAL_STRIP | 12 refills | Status: DC

## 2017-11-22 NOTE — Progress Notes (Signed)
Impression and Recommendations:    1. Type 2 diabetes mellitus with complication, without long-term current use of insulin ([name])   2. Mixed diabetic hyperlipidemia associated with type 2 diabetes mellitus ([name])   3. Severe obesity (BMI 35.0-35.9 with comorbidity) ([name])   4. Hypertension associated with diabetes ([name])   5. Elevated ALT measurement     1. DM2 -  Pt is compliant with their medications and is tolerating them well.  -  increase her metformin dose to 1000 BID.  - Continue monitoring your diet.  -  Pt will get a glucometer and will check her blood sugars daily, especially after eating poorly the night before to be aware of what this does to your blood sugar.  -  Bring a log of your blood sugars in at next OV.  2. Mixed diabetic hyperlipidemia-  - We discussed patient starting medications today however we will obtain a ultrasound of her liver prior since she did have elevated transaminases slightly on last check..  - Highly recommended pt to drink adequate amounts of water daily, equal to 1/2 her body weight in oz of water/day.  - Dietary and exercise guidelines discussed with patient. Recommended pt to reduce intake of saturated, trans fats and fatty carbohydrates. Handouts provided if desired. Continue walking daily and watching what you eat.   3. Severe obesity- Pt referred to diabetic nutritionist and has appt in March. Encouraged to lose weight.  4. Hypertension assoc with diabetes-  blood pressure well controlled currently with diet and lifestyle. -We will consider adding ACE or arm in the future if above 120-130/80 on a regular basis.  5. Elevated ALT measurement- Since her ALT is elevated and all other liver enzyme tests were normal, referred to have [place] of liver in near future. After obtaining these results, we will consider starting meds. Goal <100 LDL.   -Follow up in 6 weeks if we start her on new meds for lab visit to recheck ALT. Pt instructed to make an  appointment 2 weeks after this time, which is 2 months after starting medications.    Education and routine counseling performed. Handouts provided.  Orders Placed This Encounter  Procedures  . US Abdomen Limited RUQ  . US LIVER DOPPLER  . HM Diabetes Foot Exam    Meds ordered this encounter  Medications  . glucose blood (FREESTYLE LITE) test strip    Sig: Use to check glucose levels in the morning fasting (before eating) and 2 hours after largest meal    Dispense:  100 each    Refill:  12  . blood glucose meter kit and supplies KIT    Sig: Dispense based on patient and insurance preference. Use up to four times daily as directed. (FOR ICD-9 250.00, 250.01).  Use to check glucose levels in the morning fasting (before eating) and 2 hours after largest meal    Dispense:  1 each    Refill:  0    Order Specific Question:   Number of strips    Answer:   100    Order Specific Question:   Number of lancets    Answer:   100  . Lancet Devices MISC    Sig: Use to check glucose levels in the morning fasting (before eating) and 2 hours after largest meal    Dispense:  100 each    Refill:  6  . metFORMIN (GLUCOPHAGE) 500 MG tablet    Sig: 2 tablets twice daily  Dispense:  180 tablet    Refill:  3    Return for Get ultrasound, increase metformin, may start statin, follow-up in 8 weeks.   The patient was counseled, risk factors were discussed, anticipatory guidance given.  Gross side effects, risk and benefits, and alternatives of medications discussed with patient.  Patient is aware that all medications have potential side effects and we are unable to predict every side effect or drug-drug interaction that may occur.  Expresses verbal understanding and consents to current therapy plan and treatment regimen.  Please see AVS handed out to patient at the end of our visit for further patient instructions/ counseling done pertaining to today's office visit.    Note: This document was  prepared using Dragon voice recognition software and may include unintentional dictation errors.  This document serves as a record of services personally performed by Mellody Dance, DO. It was created on her behalf by Mayer Masker, a trained medical scribe. The creation of this record is based on the scribe's personal observations and the provider's statements to them.   I have reviewed the above medical documentation for accuracy and completeness and I concur.  Mellody Dance 11/22/17 1:28 PM   Subjective:    Chief Complaint  Patient presents with  . Follow-up     [name] is a 52 y.o. female who presents to [place] [facility] today for Diabetes Management.  From OV on 10-25-17, she started metformin. She has an appt in March with ophthalmologist. Pt has an appt on 11-30-17 with nutritionist.   DM HPI: -  She has been working on diet and exercise for diabetes. She has cut down to 1 soda per day and cut out gatorade. She stopped eating red meat and reduced how much chicken she eats. She has been eating a lot more fish as a result. She is walking daily and walks 10 laps, about 1 hour a day.   Pt is currently maintained on the following medications for diabetes:   see med list today.  Medication compliance - Yes- she takes 500 BID metformin. After starting her medication, she had some nausea the first few days but this went away. She takes one after breakfast and one after dinner. She feels more energetic. She denies any adverse symptoms from her medications.   Home glucose readings range- She has not been checking them at home. She does not have a glucometer yet.    Denies polyuria/polydipsia. Denies hypo/ hyperglycemia symptoms  - She denies new onset of: chest pain, exercise intolerance, shortness of breath, dizziness, visual changes, headache, lower extremity swelling or claudication.   Last diabetic eye exam was No results found for:  HMDIABEYEEXA  Foot exam- UTD   CHOL HPI:   -  She  is currently managed with:  See med list from today  Txmnt compliance- Pt is starting to exercise daily as well as watching what she eats.   Patient reports very little compliance with low chol/ saturated and trans fat diet.  Pt has been exercising daily and walking for about 1 hour/day. She does not eat bread, sweets, or fried food. She states she does eat pringles a lot.  RUQ pain- NA    Muscle aches- NA  No other s-e  Last lipid panel as follows:  Lab Results  Component Value Date   CHOL 190 10/12/2017   HDL 41 10/12/2017   LDLCALC 128 (H) 10/12/2017   TRIG 106 10/12/2017  CHOLHDL 4.6 (H) 10/12/2017    Hepatic Function Latest Ref Rng & Units 10/12/2017 06/09/2017 05/19/2016  Total Protein 6.0 - 8.5 g/dL 7.3 7.1 6.6  Albumin 3.5 - 5.5 g/dL 4.3 4.2 4.0  AST 0 - 40 IU/L 27 35 16  ALT 0 - 32 IU/L 53(H) 55(H) 15  Alk Phosphatase 39 - 117 IU/L 88 78 60  Total Bilirubin 0.0 - 1.2 mg/dL 0.3 0.4 0.5   Last A1C in the office was:  Lab Results  Component Value Date   HGBA1C 7.2 (H) 10/12/2017   HGBA1C 6.2 (H) 06/09/2017   HGBA1C 6.0 (H) 05/19/2016    Lab Results  Component Value Date   LDLCALC 128 (H) 10/12/2017   CREATININE 0.98 10/12/2017      Last 3 blood pressure readings in our office are as follows: BP Readings from Last 3 Encounters:  11/22/17 131/82  10/25/17 114/79  10/11/17 128/87    BMI Readings from Last 3 Encounters:  11/22/17 35.64 kg/m  10/25/17 34.97 kg/m  10/11/17 35.12 kg/m     No problems updated.    Patient Care Team    Relationship Specialty Notifications Start End  Mellody Dance, DO PCP - General Family Medicine  08/13/17   Roseanne Kaufman, MD Consulting Physician Orthopedic Surgery  10/14/17   Doran Stabler, MD Consulting Physician Gastroenterology  10-14-17    Comment: Getting first colonoscopy in this October 11 2017  Huel Cote, NP Nurse Practitioner  Obstetrics and Gynecology  10-14-17      Patient Active Problem List   Diagnosis Date Noted  . Mixed diabetic hyperlipidemia associated with type 2 diabetes mellitus ([place]) 10/25/2017    Priority: High  . Hypertension associated with diabetes ([place]) 10/25/2017    Priority: High  . Diabetes mellitus, type II ([place]) 10/25/2017    Priority: High  . Severe obesity (BMI 35.0-35.9 with comorbidity) ([place]) 14-Oct-2017    Priority: High  . Elevated alanine aminotransferase (ALT) level 14-Oct-2017    Priority: Medium  . Low HDL (under 40) 10/14/17    Priority: Medium  . Hypothyroidism 02/08/2013    Priority: Medium  . H/O total hysterectomy- benign reasons 2017-10-14    Priority: Low  . Family history of breast cancer in mother-deceased late 01/21/23 10-14-2017    Priority: Low  . Family history of colon cancer- MGF- in 60's 10/14/2017    Priority: Low  . Vitamin D deficiency 10-14-17    Priority: Low  . Family history of diabetes mellitus (DM)- in daughter October 14, 2017    Priority: Low  . Left knee pain- Dr Amedeo Plenty Oct 14, 2017  . Perimenopausal 2017-10-14  . Blood glucose elevated Oct 14, 2017     Past Medical History:  Diagnosis Date  . Acute meniscal tear of left knee   . History of hypothyroidism   . Prediabetes 10/14/17   Elevated blood glucose since Jan 21, 2012 possibly prior.     Past Surgical History:  Procedure Laterality Date  . [place] ENDOMETRIAL ABLATION  03-16-2007  . KNEE ARTHROSCOPY Right   . KNEE ARTHROSCOPY WITH MEDIAL MENISECTOMY Left 05/17/2015   Procedure: LEFT KNEE ARTHROSCOPY WITH MEDIAL MENISECTOMY, CHONDROPLASTY PATELLA;  Surgeon: Latanya Maudlin, MD;  Location: [facility];  Service: Orthopedics;  Laterality: Left;  . LAPAROSOCPY BILATERAL TUBAL LIGATION W/ CAUTERIZATION  10-15-2000  . VAGINAL HYSTERECTOMY  01/25/2012   Procedure: HYSTERECTOMY VAGINAL;  Surgeon: Anastasio Auerbach, MD;  Location: [facility];  Service: Gynecology;   Laterality: N/A;  Family History  Problem Relation Age of Onset  . Hypertension Mother   . Cancer Mother        LUG, THROAT, & BACK  . Breast cancer Mother   . Cancer Father        THROAT  . Throat cancer Father   . Cancer Maternal Grandmother        KIDNEY  . Cancer Maternal Grandfather        COLON  . Colon cancer Maternal Grandfather 49  . Stomach cancer Neg Hx      Social History   Substance and Sexual Activity  Drug Use No  ,  Social History   Substance and Sexual Activity  Alcohol Use No  . Alcohol/week: 0.0 oz  ,  Social History   Tobacco Use  Smoking Status Never Smoker  Smokeless Tobacco Never Used  ,    No current outpatient medications on file prior to visit.   Current Facility-Administered Medications on File Prior to Visit  Medication Dose Route Frequency Provider Last Rate Last Dose  . 0.9 %  sodium chloride infusion  500 mL Intravenous Once Doran Stabler, MD         Allergies  Allergen Reactions  . Adhesive [Tape] Rash     Review of Systems:   General:  Denies fever, chills Optho/Auditory:   Denies visual changes, blurred vision Respiratory:   Denies SOB, cough, wheeze, DIB  Cardiovascular:   Denies chest pain, palpitations, painful respirations Gastrointestinal:   Denies nausea, vomiting, diarrhea.  Endocrine:     Denies new hot or cold intolerance Musculoskeletal:  Denies joint swelling, gait issues, or new unexplained myalgias/ arthralgias Skin:  Denies rash, suspicious lesions  Neurological:    Denies dizziness, unexplained weakness, numbness  Psychiatric/Behavioral:   Denies mood changes    Objective:     Blood pressure 131/82, pulse 70, height '5\' 8"'  (1.727 m), weight 234 lb 6.4 oz (106.3 kg), last menstrual period 01/03/2012, SpO2 98 %.  Body mass index is 35.64 kg/m.  General: Well Developed, well nourished, and in no acute distress.  HEENT: Normocephalic, atraumatic, pupils equal round reactive to light, neck  supple, No carotid bruits, no JVD Skin: Warm and dry, cap RF less 2 sec Cardiac: Regular rate and rhythm, S1, S2 WNL's, no murmurs rubs or gallops Respiratory: ECTA B/L, Not using accessory muscles, speaking in full sentences. NeuroM-Sk: Ambulates w/o assistance, moves ext * 4 w/o difficulty, sensation grossly intact.  Ext: scant edema b/l lower ext Psych: No HI/SI, judgement and insight good, Euthymic mood. Full Affect.

## 2017-11-22 NOTE — Patient Instructions (Addendum)
-Need to get patient a glucometer so she can check her fasting blood sugars especially after she eats very poorly the night before and healthy the night before so she can learn good and bad habits.  She will keep a log and write it down to bring in next office visit.  To check out her elevation in ALT which is likely fatty liver, we will obtain an ultrasound of the liver in the near future.  Once that comes back we will discuss the findings and then consider putting her on a statin medication to lower her LDL to a goal under 100 for her diabetes.  I will call you with the results of the ultrasound and then put you on the cholesterol medicine.     If I start you on cholesterol medicines I want you to come back in 6 weeks for a lab only visit to recheck your ALT.  Then I will see you 2 weeks later-which will be 2 months after we start the new medicine  Drink half of your weight in ounces of water per day and try to exercise to a goal of at least 30 minutes daily.  Continue metformin.  Please go up to 1000 mg twice daily from the 500 twice daily     Guidelines for a Low Cholesterol, Low Saturated Fat Diet  Fats - Limit total intake of fats and oils. - Avoid butter, stick margarine, shortening, lard, palm and coconut oils. - Limit mayonnaise, salad dressings, gravies and sauces, unless they are homemade with low-fat ingredients. - Limit chocolate. - Choose low-fat and nonfat products, such as low-fat mayonnaise, low-fat or non-hydrogenated peanut butter, low-fat or fat-free salad dressings and nonfat gravy. - Use vegetable oil, such as canola or olive oil. - Look for margarine that does not contain trans fatty acids. - Use nuts in moderate amounts. - Read ingredient labels carefully to determine both amount and type of fat present in foods. Limit saturated and trans fats! - Avoid high-fat processed and convenience foods.  Meats and Meat Alternatives - Choose fish, chicken, [place] and lean  meats. - Use dried beans, peas, lentils and tofu. - Limit egg yolks to three to four per week. - If you eat red meat, limit to no more than three servings per week and choose loin or round cuts. - Avoid fatty meats, such as bacon, sausage, franks, luncheon meats and ribs. - Avoid all organ meats, including liver.  Dairy - Choose nonfat or low-fat milk, yogurt and cottage cheese. - Most cheeses are high in fat. Choose cheeses made from non-fat milk, such as mozzarella and ricotta cheese. - Choose light or fat-free cream cheese and sour cream. - Avoid cream and sauces made with cream.  Fruits and Vegetables - Eat a wide variety of fruits and vegetables. - Use lemon juice, vinegar or "mist" olive oil on vegetables. - Avoid adding sauces, fat or oil to vegetables.  Breads, Cereals and Grains - Choose whole-grain breads, cereals, pastas and rice. - Avoid high-fat snack foods, such as granola, cookies, pies, pastries, doughnuts and croissants.  Cooking Tips - Avoid deep fried foods. - Trim visible fat off meats and remove skin from poultry before cooking. - Bake, broil, boil, poach or roast poultry, fish and lean meats. - Drain and discard fat that drains out of meat as you cook it. - Add little or no fat to foods. - Use vegetable oil sprays to grease pans for cooking or baking. - Steam vegetables. -  Use herbs or no-oil marinades to flavor foods.

## 2017-11-24 DIAGNOSIS — M25611 Stiffness of right shoulder, not elsewhere classified: Secondary | ICD-10-CM | POA: Diagnosis not present

## 2017-11-24 DIAGNOSIS — M25511 Pain in right shoulder: Secondary | ICD-10-CM | POA: Diagnosis not present

## 2017-11-24 DIAGNOSIS — M6281 Muscle weakness (generalized): Secondary | ICD-10-CM | POA: Diagnosis not present

## 2017-11-29 DIAGNOSIS — M25511 Pain in right shoulder: Secondary | ICD-10-CM | POA: Diagnosis not present

## 2017-11-29 DIAGNOSIS — M6281 Muscle weakness (generalized): Secondary | ICD-10-CM | POA: Diagnosis not present

## 2017-11-29 DIAGNOSIS — M25611 Stiffness of right shoulder, not elsewhere classified: Secondary | ICD-10-CM | POA: Diagnosis not present

## 2017-11-30 ENCOUNTER — Encounter: Payer: 59 | Attending: Family Medicine | Admitting: *Deleted

## 2017-11-30 DIAGNOSIS — E118 Type 2 diabetes mellitus with unspecified complications: Secondary | ICD-10-CM | POA: Diagnosis not present

## 2017-11-30 DIAGNOSIS — Z713 Dietary counseling and surveillance: Secondary | ICD-10-CM | POA: Insufficient documentation

## 2017-12-01 ENCOUNTER — Ambulatory Visit
Admission: RE | Admit: 2017-12-01 | Discharge: 2017-12-01 | Disposition: A | Discharge status: Home / Self Care | Payer: 59 | Source: Ambulatory Visit | Attending: Family Medicine | Admitting: Family Medicine

## 2017-12-01 DIAGNOSIS — I10 Essential (primary) hypertension: Secondary | ICD-10-CM

## 2017-12-01 DIAGNOSIS — E1169 Type 2 diabetes mellitus with other specified complication: Secondary | ICD-10-CM

## 2017-12-01 DIAGNOSIS — E1159 Type 2 diabetes mellitus with other circulatory complications: Secondary | ICD-10-CM

## 2017-12-01 DIAGNOSIS — E118 Type 2 diabetes mellitus with unspecified complications: Secondary | ICD-10-CM

## 2017-12-01 DIAGNOSIS — K760 Fatty (change of) liver, not elsewhere classified: Secondary | ICD-10-CM | POA: Diagnosis not present

## 2017-12-01 DIAGNOSIS — E782 Mixed hyperlipidemia: Secondary | ICD-10-CM

## 2017-12-01 DIAGNOSIS — R740 Nonspecific elevation of levels of transaminase and lactic acid dehydrogenase [LDH]: Secondary | ICD-10-CM

## 2017-12-01 DIAGNOSIS — R7401 Elevation of levels of liver transaminase levels: Secondary | ICD-10-CM

## 2017-12-01 DIAGNOSIS — I152 Hypertension secondary to endocrine disorders: Secondary | ICD-10-CM

## 2017-12-01 DIAGNOSIS — Z6835 Body mass index (BMI) 35.0-35.9, adult: Secondary | ICD-10-CM

## 2017-12-01 NOTE — Patient Instructions (Signed)
Plan:  Aim for 3 Carb Choices per meal (45 grams) +/- 1 either way  Aim for 0-2 Carbs per snack if hungry  Include protein in moderation with your meals and snacks Consider reading food labels for Total Carbohydrate of foods Continue with your activity level by walking for 30 minutes several days a week as tolerated Consider checking BG at alternate times per day a  Constinue taking medication as directed by MD

## 2017-12-01 NOTE — Progress Notes (Signed)
Diabetes Self-Management Education  Visit Type: First/Initial  Appt. Start Time: 1400 Appt. End Time: 6063  12/01/2017  [name], identified by name and date of birth, is a 52 y.o. female with a diagnosis of Diabetes: Type 2. Patient states she was started on Metformin last month and her meter Rx has been called in, she just needs to pick it up. She would like some instruction on how to use a meter today. She has been active historically and has witnessed the benefits of physical activity. She plans to re-join a group of women that walk or run daily called [employer]. She has many food questions for today.  ASSESSMENT  Height 5\' 8"  (1.727 m), weight 227 lb 12.8 oz (103.3 kg), last menstrual period 01/03/2012. Body mass index is 34.64 kg/m.  Diabetes Self-Management Education - 11/30/17 1423      Visit Information   Visit Type  First/Initial      Initial Visit   Diabetes Type  Type 2    Are you currently following a meal plan?  No    Are you taking your medications as prescribed?  Yes    Date Diagnosed  10/2017      Health Coping   How would you rate your overall health?  Good      Psychosocial Assessment   Patient Belief/Attitude about Diabetes  Afraid because I don't know what to eat yet    Self-care barriers  None    Other persons present  Patient    Patient Concerns  Nutrition/Meal planning;Glycemic Control;Monitoring    Special Needs  None    Learning Readiness  Change in progress    How often do you need to have someone help you when you read instructions, pamphlets, or other written materials from your doctor or pharmacy?  1 - Never    What is the last grade level you completed in school?  2 years college      Pre-Education Assessment   Patient understands the diabetes disease and treatment process.  Needs Instruction    Patient understands incorporating nutritional management into lifestyle.  Needs Instruction    Patient undertands incorporating physical  activity into lifestyle.  Needs Instruction    Patient understands using medications safely.  Needs Instruction    Patient understands monitoring blood glucose, interpreting and using results  Needs Instruction    Patient understands prevention, detection, and treatment of acute complications.  Needs Instruction    Patient understands prevention, detection, and treatment of chronic complications.  Needs Instruction    Patient understands how to develop strategies to address psychosocial issues.  Needs Instruction    Patient understands how to develop strategies to promote health/change behavior.  Needs Instruction      Complications   Last HgB A1C per patient/outside source  7.2 %    How often do you check your blood sugar?  0 times/day (not testing) plans to pick up meter today    Have you had a dilated eye exam in the past 12 months?  No    Have you had a dental exam in the past 12 months?  Yes    Are you checking your feet?  Yes    How many days per week are you checking your feet?  3      Dietary Intake   Breakfast  flavored oatmeal x 1 pkt with boiled egg, and Premier Protein shakes OR Muscle Milk light OR     Lunch  chicken  breast with vegetables    Snack (afternoon)  raw cucumbers with tuna OR fresh fruit    Dinner  lean meat or fish baked with vegetables (has been avoiding starchy foods since diagnosed)    Snack (evening)  not since diagnosed    Beverage(s)  water now, no more sodas      Exercise   Exercise Type  Light (walking / raking leaves)    How many days per week to you exercise?  4    How many minutes per day do you exercise?  30    Total minutes per week of exercise  120      Patient Education   Previous Diabetes Education  No    Disease state   Definition of diabetes, type 1 and 2, and the diagnosis of diabetes    Nutrition management   Role of diet in the treatment of diabetes and the relationship between the three main macronutrients and blood glucose level;Food  label reading, portion sizes and measuring food.;Carbohydrate counting    Physical activity and exercise   Role of exercise on diabetes management, blood pressure control and cardiac health.;Helped patient identify appropriate exercises in relation to his/her diabetes, diabetes complications and other health issue.    Medications  Reviewed patients medication for diabetes, action, purpose, timing of dose and side effects.    Monitoring  Taught/evaluated SMBG meter.;Purpose and frequency of SMBG.;Identified appropriate SMBG and/or A1C goals.    Chronic complications  Relationship between chronic complications and blood glucose control    Psychosocial adjustment  Role of stress on diabetes      Individualized Goals (developed by patient)   Nutrition  Follow meal plan discussed    Physical Activity  Exercise 3-5 times per week    Medications  take my medication as prescribed    Monitoring   test blood glucose pre and post meals as discussed      Post-Education Assessment   Patient understands the diabetes disease and treatment process.  Demonstrates understanding / competency    Patient understands incorporating nutritional management into lifestyle.  Demonstrates understanding / competency    Patient undertands incorporating physical activity into lifestyle.  Demonstrates understanding / competency    Patient understands using medications safely.  Demonstrates understanding / competency    Patient understands monitoring blood glucose, interpreting and using results  Demonstrates understanding / competency    Patient understands prevention, detection, and treatment of acute complications.  Demonstrates understanding / competency    Patient understands prevention, detection, and treatment of chronic complications.  Demonstrates understanding / competency    Patient understands how to develop strategies to address psychosocial issues.  Demonstrates understanding / competency    Patient understands  how to develop strategies to promote health/change behavior.  Demonstrates understanding / competency      Outcomes   Expected Outcomes  Demonstrated interest in learning. Expect positive outcomes    Future DMSE  4-6 wks    Program Status  Completed      Individualized Plan for Diabetes Self-Management Training:   Learning Objective:  Patient will have a greater understanding of diabetes self-management. Patient education plan is to attend individual and/or group sessions per assessed needs and concerns.   Plan:   Patient Instructions  Plan:  Aim for 3 Carb Choices per meal (45 grams) +/- 1 either way  Aim for 0-2 Carbs per snack if hungry  Include protein in moderation with your meals and snacks Consider reading food labels for Total  Carbohydrate of foods Continue with your activity level by walking for 30 minutes several days a week as tolerated Consider checking BG at alternate times per day a  Constinue taking medication as directed by MD  Expected Outcomes:  Demonstrated interest in learning. Expect positive outcomes  Education material provided: Living Well with Diabetes, A1C conversion sheet, Meal plan card and Carbohydrate counting sheet  If problems or questions, patient to contact team via:  Phone  Future DSME appointment: 4-6 wks

## 2017-12-02 DIAGNOSIS — M25611 Stiffness of right shoulder, not elsewhere classified: Secondary | ICD-10-CM | POA: Diagnosis not present

## 2017-12-02 DIAGNOSIS — M25511 Pain in right shoulder: Secondary | ICD-10-CM | POA: Diagnosis not present

## 2017-12-02 DIAGNOSIS — M6281 Muscle weakness (generalized): Secondary | ICD-10-CM | POA: Diagnosis not present

## 2017-12-06 ENCOUNTER — Telehealth: Payer: Self-pay | Admitting: Family Medicine

## 2017-12-06 DIAGNOSIS — M25611 Stiffness of right shoulder, not elsewhere classified: Secondary | ICD-10-CM | POA: Diagnosis not present

## 2017-12-06 DIAGNOSIS — M25511 Pain in right shoulder: Secondary | ICD-10-CM | POA: Diagnosis not present

## 2017-12-06 DIAGNOSIS — M6281 Muscle weakness (generalized): Secondary | ICD-10-CM | POA: Diagnosis not present

## 2017-12-06 NOTE — Telephone Encounter (Signed)
Calling back about test results

## 2017-12-06 NOTE — Telephone Encounter (Signed)
Spoke to patient and notified of results.  Please see result notes. MPulliam, CMA/RT(R)

## 2017-12-10 DIAGNOSIS — M25611 Stiffness of right shoulder, not elsewhere classified: Secondary | ICD-10-CM | POA: Diagnosis not present

## 2017-12-10 DIAGNOSIS — M6281 Muscle weakness (generalized): Secondary | ICD-10-CM | POA: Diagnosis not present

## 2017-12-10 DIAGNOSIS — M25511 Pain in right shoulder: Secondary | ICD-10-CM | POA: Diagnosis not present

## 2017-12-13 DIAGNOSIS — M25511 Pain in right shoulder: Secondary | ICD-10-CM | POA: Diagnosis not present

## 2017-12-13 DIAGNOSIS — M25611 Stiffness of right shoulder, not elsewhere classified: Secondary | ICD-10-CM | POA: Diagnosis not present

## 2017-12-13 DIAGNOSIS — M6281 Muscle weakness (generalized): Secondary | ICD-10-CM | POA: Diagnosis not present

## 2017-12-16 DIAGNOSIS — M6281 Muscle weakness (generalized): Secondary | ICD-10-CM | POA: Diagnosis not present

## 2017-12-16 DIAGNOSIS — M25511 Pain in right shoulder: Secondary | ICD-10-CM | POA: Diagnosis not present

## 2017-12-16 DIAGNOSIS — M25611 Stiffness of right shoulder, not elsewhere classified: Secondary | ICD-10-CM | POA: Diagnosis not present

## 2017-12-20 DIAGNOSIS — M25511 Pain in right shoulder: Secondary | ICD-10-CM | POA: Diagnosis not present

## 2017-12-20 DIAGNOSIS — M25611 Stiffness of right shoulder, not elsewhere classified: Secondary | ICD-10-CM | POA: Diagnosis not present

## 2017-12-20 DIAGNOSIS — M6281 Muscle weakness (generalized): Secondary | ICD-10-CM | POA: Diagnosis not present

## 2017-12-28 ENCOUNTER — Ambulatory Visit: Payer: 59 | Admitting: *Deleted

## 2017-12-31 ENCOUNTER — Other Ambulatory Visit: Payer: Self-pay | Admitting: Family Medicine

## 2018-01-05 ENCOUNTER — Encounter: Payer: 59 | Attending: Family Medicine | Admitting: *Deleted

## 2018-01-05 DIAGNOSIS — E118 Type 2 diabetes mellitus with unspecified complications: Secondary | ICD-10-CM | POA: Insufficient documentation

## 2018-01-05 DIAGNOSIS — Z713 Dietary counseling and surveillance: Secondary | ICD-10-CM | POA: Diagnosis not present

## 2018-01-05 NOTE — Patient Instructions (Signed)
Plan:  Aim for 3 Carb Choices per meal (45 grams) +/- 1 either way  Aim for 0-2 Carbs per snack if hungry  Include protein in moderation with your meals and snacks Consider reading food labels for Total Carbohydrate of foods Continue with your activity level by walking for 30 minutes several days a week as tolerated Consider checking BG at alternate times per day  Including after meals sometimes Constinue taking medication as directed by MD

## 2018-01-06 ENCOUNTER — Ambulatory Visit: Payer: 59 | Admitting: Family Medicine

## 2018-01-06 DIAGNOSIS — E119 Type 2 diabetes mellitus without complications: Secondary | ICD-10-CM | POA: Diagnosis not present

## 2018-01-06 LAB — HM DIABETES EYE EXAM

## 2018-01-06 NOTE — Progress Notes (Signed)
Diabetes Self-Management Education  Visit Type:  Follow-up  Appt. Start Time: 1045 Appt. End Time: 1115  01/06/2018  [name], identified by name and date of birth, is a 52 y.o. female with a diagnosis of Diabetes: Type 2.  She is frustrated with no decrease in her weight but acknowledges that her BG's are improved. She would like further explanation of Carb Counting.  ASSESSMENT  Height 5\' 8"  (1.727 m), weight 229 lb (103.9 kg), last menstrual period 01/03/2012. Body mass index is 34.82 kg/m.   Diabetes Self-Management Education - 01/06/18 1723      Psychosocial Assessment   Patient Concerns  Nutrition/Meal planning;Glycemic Control    Special Needs  None    Learning Readiness  Change in progress      Complications   How often do you check your blood sugar?  1-2 times/day    Fasting Blood glucose range (mg/dL)  70-129      Exercise   Exercise Type  Light (walking / raking leaves)    How many days per week to you exercise?  3    How many minutes per day do you exercise?  30    Total minutes per week of exercise  90      Patient Self-Evaluation of Goals - Patient rates self as meeting previously set goals (% of time)   Nutrition  50 - 75 %    Physical Activity  50 - 75 %    Medications  >75%    Monitoring  >75%    Problem Solving  50 - 75 %    Reducing Risk  >75%    Health Coping  50 - 75 %      Outcomes   Program Status  Completed      Subsequent Visit   Since your last visit have you continued or begun to take your medications as prescribed?  Yes    Since your last visit have you experienced any weight changes?  No change    Since your last visit, are you checking your blood glucose at least once a day?  Yes      Learning Objective:  Patient will have a greater understanding of diabetes self-management. Patient education plan is to attend individual and/or group sessions per assessed needs and concerns.  Plan:   Patient Instructions  Plan:  Aim for  3 Carb Choices per meal (45 grams) +/- 1 either way  Aim for 0-2 Carbs per snack if hungry  Include protein in moderation with your meals and snacks Consider reading food labels for Total Carbohydrate of foods Continue with your activity level by walking for 30 minutes several days a week as tolerated Consider checking BG at alternate times per day  Including after meals sometimes Constinue taking medication as directed by MD  Expected Outcomes:  Demonstrated interest in learning. Expect positive outcomes  Education material provided: A1C conversion sheet, Snack sheet and Carbohydrate counting sheet  If problems or questions, patient to contact team via:  Phone  Future DSME appointment: - 3-4 months

## 2018-02-05 ENCOUNTER — Other Ambulatory Visit: Payer: Self-pay

## 2018-02-05 ENCOUNTER — Encounter (HOSPITAL_COMMUNITY): Payer: Self-pay | Admitting: *Deleted

## 2018-02-05 ENCOUNTER — Ambulatory Visit (HOSPITAL_COMMUNITY)
Admission: EM | Admit: 2018-02-05 | Discharge: 2018-02-05 | Disposition: A | Discharge status: Home / Self Care | Payer: 59 | Attending: Family Medicine | Admitting: Family Medicine

## 2018-02-05 DIAGNOSIS — M545 Low back pain: Secondary | ICD-10-CM

## 2018-02-05 LAB — POCT URINALYSIS DIP (DEVICE)
BILIRUBIN URINE: NEGATIVE
GLUCOSE, UA: NEGATIVE mg/dL
Ketones, ur: NEGATIVE mg/dL
Leukocytes, UA: NEGATIVE
Nitrite: NEGATIVE
Protein, ur: NEGATIVE mg/dL
SPECIFIC GRAVITY, URINE: 1.025 (ref 1.005–1.030)
Urobilinogen, UA: 0.2 mg/dL (ref 0.0–1.0)
pH: 5.5 (ref 5.0–8.0)

## 2018-02-05 NOTE — ED Provider Notes (Signed)
[facility]    CSN: [id number] Arrival date & time: 02/05/18  1726     History   Chief Complaint Chief Complaint  Patient presents with  . Back Pain    HPI [name] is a 52 y.o. female.   Midline low back pain off and on since she started metformin.  She began on 1000 mg twice daily with an A1c of 7.2.  Pain is nonradiating.  No prior history of back problems  HPI  Past Medical History:  Diagnosis Date  . Acute meniscal tear of left knee   . History of hypothyroidism   . Prediabetes 10-14-17   Elevated blood glucose since 01/21/2012 possibly prior.    Patient Active Problem List   Diagnosis Date Noted  . Mixed diabetic hyperlipidemia associated with type 2 diabetes mellitus ([place]) 10/25/2017  . Hypertension associated with diabetes ([place]) 10/25/2017  . Diabetes mellitus, type II ([place]) 10/25/2017  . H/O total hysterectomy- benign reasons 10/14/2017  . Left knee pain- Dr Amedeo Plenty 14-Oct-2017  . Perimenopausal 14-Oct-2017  . Family history of breast cancer in mother-deceased late Jan 21, 2023 2017/10/14  . Family history of colon cancer- MGF- in 60's 2017/10/14  . Blood glucose elevated 10-14-17  . Elevated alanine aminotransferase (ALT) level 2017/10/14  . Low HDL (under 40) Oct 14, 2017  . Severe obesity (BMI 35.0-35.9 with comorbidity) ([place]) October 14, 2017  . Vitamin D deficiency 10/14/2017  . Family history of diabetes mellitus (DM)- in daughter Oct 14, 2017  . Hypothyroidism 02/08/2013    Past Surgical History:  Procedure Laterality Date  . [place] ENDOMETRIAL ABLATION  03-16-2007  . KNEE ARTHROSCOPY Right   . KNEE ARTHROSCOPY WITH MEDIAL MENISECTOMY Left 05/17/2015   Procedure: LEFT KNEE ARTHROSCOPY WITH MEDIAL MENISECTOMY, CHONDROPLASTY PATELLA;  Surgeon: Latanya Maudlin, MD;  Location: [facility];  Service: Orthopedics;  Laterality: Left;  . LAPAROSOCPY BILATERAL TUBAL LIGATION W/ CAUTERIZATION  10-15-2000  . VAGINAL  HYSTERECTOMY  01/25/2012   Procedure: HYSTERECTOMY VAGINAL;  Surgeon: Anastasio Auerbach, MD;  Location: [facility];  Service: Gynecology;  Laterality: N/A;    OB History    Gravida  7   Para  4   Term      Preterm      AB  3   Living        SAB  3   TAB      Ectopic      Multiple      Live Births               Home Medications    Prior to Admission medications   Medication Sig Start Date End Date Taking? Authorizing Provider  blood glucose meter kit and supplies KIT Dispense based on patient and insurance preference. Use up to four times daily as directed. (FOR ICD-9 250.00, 250.01).  Use to check glucose levels in the morning fasting (before eating) and 2 hours after largest meal 11/22/17   Opalski, Deborah, DO  Blood Glucose Monitoring Suppl (FREESTYLE LITE) DEVI USE TO CHECK GLUCOSE LEVEL IN THE MORNING (BEFORE EATING) AND 2 HOURS AFTER LARGEST MEAL 12/31/17   Opalski, Neoma Laming, DO  glucose blood (FREESTYLE LITE) test strip Use to check glucose levels in the morning fasting (before eating) and 2 hours after largest meal 11/22/17   Mellody Dance, DO  Lancet Devices MISC Use to check glucose levels in the morning fasting (before eating) and 2 hours after largest meal 11/22/17   Opalski, Deborah, DO  metFORMIN (GLUCOPHAGE) 500  MG tablet 2 tablets twice daily 11/22/17   Mellody Dance, DO    Family History Family History  Problem Relation Age of Onset  . Hypertension Mother   . Cancer Mother        LUG, THROAT, & BACK  . Breast cancer Mother   . Cancer Father        THROAT  . Throat cancer Father   . Cancer Maternal Grandmother        KIDNEY  . Cancer Maternal Grandfather        COLON  . Colon cancer Maternal Grandfather 29  . Stomach cancer Neg Hx     Social History Social History   Tobacco Use  . Smoking status: Never Smoker  . Smokeless tobacco: Never Used  Substance Use Topics  . Alcohol use: No    Alcohol/week: 0.0 oz  . Drug use: No      Allergies   Adhesive [tape]   Review of Systems Review of Systems  Constitutional: Negative.   HENT: Negative.   Respiratory: Negative.   Cardiovascular: Negative.   Gastrointestinal: Negative.   Genitourinary: Negative.   Musculoskeletal: Negative.      Physical Exam Triage Vital Signs ED Triage Vitals [02/05/18 1810]  Enc Vitals Group     BP      Pulse      Resp      Temp      Temp src      SpO2      Weight      Height      Head Circumference      Peak Flow      Pain Score 8     Pain Loc      Pain Edu?      Excl. in [place]?    No data found.  Updated Vital Signs LMP 01/03/2012   Visual Acuity Right Eye Distance:   Left Eye Distance:   Bilateral Distance:    Right Eye Near:   Left Eye Near:    Bilateral Near:     Physical Exam  Constitutional: She appears well-developed and well-nourished.  Cardiovascular: Normal rate and regular rhythm.  Pulmonary/Chest: Effort normal and breath sounds normal.  Musculoskeletal:  Back: Pain with flexion lateral bending and extension are within normal limits. Straight leg raising is negative. Deep tendon reflexes are symmetric.     UC Treatments / Results  Labs (all labs ordered are listed, but only abnormal results are displayed) Labs Reviewed - No data to display  EKG None Radiology No results found.  Procedures Procedures (including critical care time)  Medications Ordered in UC Medications - No data to display   Initial Impression / Assessment and Plan / UC Course  I have reviewed the triage vital signs and the nursing notes.  Pertinent labs & imaging results that were available during my care of the patient were reviewed by me and considered in my medical decision making (see chart for details).    Back pain possibly secondary to metformin use would recommend dose reduction of metformin to see if that improves pain.  There is no evidence of lumbar disc disease or muscle spasm.  Final  Clinical Impressions(s) / UC Diagnoses   Final diagnoses:  None    ED Discharge Orders    None       Controlled Substance Prescriptions [place] Controlled Substance Registry consulted? No   Wardell Honour, MD 02/05/18 1843

## 2018-02-05 NOTE — ED Triage Notes (Signed)
Started Thursday evening, per pt she is a diabetic and she recently just started taking the medication, per pt it is her lower back

## 2018-02-07 ENCOUNTER — Ambulatory Visit (INDEPENDENT_AMBULATORY_CARE_PROVIDER_SITE_OTHER): Payer: 59 | Admitting: Family Medicine

## 2018-02-07 ENCOUNTER — Encounter: Payer: Self-pay | Admitting: Family Medicine

## 2018-02-07 VITALS — BP 132/89 | HR 64 | Ht 68.0 in | Wt 227.0 lb

## 2018-02-07 DIAGNOSIS — Z6835 Body mass index (BMI) 35.0-35.9, adult: Secondary | ICD-10-CM

## 2018-02-07 DIAGNOSIS — M545 Low back pain: Secondary | ICD-10-CM | POA: Diagnosis not present

## 2018-02-07 DIAGNOSIS — G8929 Other chronic pain: Secondary | ICD-10-CM | POA: Diagnosis not present

## 2018-02-07 MED ORDER — CYCLOBENZAPRINE HCL 10 MG PO TABS: 10.0000 mg | ORAL_TABLET | Freq: Three times a day (TID) | ORAL | 0 refills | Status: DC | PRN

## 2018-02-07 MED ORDER — IBUPROFEN-FAMOTIDINE 800-26.6 MG PO TABS: 1.0000 | ORAL_TABLET | Freq: Three times a day (TID) | ORAL | 0 refills | Status: DC | PRN

## 2018-02-07 NOTE — Progress Notes (Signed)
Impression and Recommendations:    1. Chronic midline low back pain without sciatica   2. Severe obesity (BMI 35.0-35.9 with comorbidity) ([place])     1. Acute Low Back Pain - Advised patient that her back pain is most likely chronic and not related to the metformin.  Discussed that nothing is impossible in medicine, but the chances of her pain being related to metformin or her kidneys are slim, especially given her good urine results.  - Medications & muscle relaxers prescribed today for pain.  - Reviewed the use of muscle relaxers with the patient.  She knows that using these medications may make her feel drunk or drowsy.  - If this pain continues for more than 2 weeks after the use of pain meds and muscle relaxers, she should go to physical therapy for follow up and further resolution.  - No red flag symptoms present.  If she has any loss of bowel, bladder, neurological symptoms, or any other new symptoms, she will let us know.  For now, patient declines physical therapy and declines referral to back specialist.   Education and routine counseling performed. Handouts provided.  2. Follow-Up - Advised patient that if she doesn't take her diabetes meds, her sugars will be elevated.  She needs to resume her metformin regularly.  Patient started metformin in January.  - Patient knows that she needs to return for a chronic OV for follow up on her diabetes and other chronic issues.   Return if symptoms worsen or fail to improve for your acute symptoms, for Please follow-up in the near future for chronic care DM, HTN etc- q 3 mo (since end Jan).   Gross side effects, risk and benefits, and alternatives of medications discussed with patient.  Patient is aware that all medications have potential side effects and we are unable to predict every sideeffect or drug-drug interaction that may occur.  Expresses verbal understanding and consents to current therapy plan and treatment  regiment.  Please see AVS handed out to patient at the end of our visit for further patient instructions/ counseling done pertaining to today's office visit.  This document serves as a record of services personally performed by Mellody Dance, DO. It was created on her behalf by Toni Amend, a trained medical scribe. The creation of this record is based on the scribe's personal observations and the provider's statements to them.   I have reviewed the above medical documentation for accuracy and completeness and I concur.  Mellody Dance 02/13/18 7:57 PM    ---------------------------------------------------------------------------------------------------------------------------------------------------------------  Subjective:   CC: BACK PAIN  Problem  Chronic Midline Low Back Pain Without Sciatica    Notes that she drinks 6 bottles of water per day.  Patient isn't sure why her back hurts.  Went to Urgent Care this past Saturday, 2 days ago, with the same back pain.  Notes that she was worried it might be her kidneys, and worried it might be due to taking metformin.  Hard for her to get up and sit down, and "sometimes it's hard for me to walk around."  Location: Lower back, right along the midline.  Onset: Patient notes that the pain began a couple of months ago.  Believes that the pain started around when she started taking metformin (January), but also thought it might be her mattress.  She bought a new mattress, but this made no difference in her back pain.  Took ibuprofen for two days, but noted no improvement.  Radiation: No radiation.  Trauma: No inciting trauma that the patient knows of.  Best sitting/standing/leaning forward:  Patient cannot lean forward today, notes "that would be too painful."  Red Flags - No   Patient Care Team    Relationship Specialty Notifications Start End  Mellody Dance, DO PCP - General Family Medicine  08/13/17   Roseanne [facility], MD Consulting Physician Orthopedic Surgery  09/20/17   Doran Stabler, MD Consulting Physician Gastroenterology  09/20/17    Comment: Getting first colonoscopy in this October 11 2017  Huel Cote, NP Nurse Practitioner Obstetrics and Gynecology  09/20/17     The following portions of the patient's history were reviewed and updated as appropriate: allergies, current medications, past family history, past medical history, past social history, past surgical history and problem list.  Previous Medications   BLOOD GLUCOSE METER KIT AND SUPPLIES KIT    Dispense based on patient and insurance preference. Use up to four times daily as directed. (FOR ICD-9 250.00, 250.01).  Use to check glucose levels in the morning fasting (before eating) and 2 hours after largest meal   BLOOD GLUCOSE MONITORING SUPPL (FREESTYLE LITE) DEVI    USE TO CHECK GLUCOSE LEVEL IN THE MORNING (BEFORE EATING) AND 2 HOURS AFTER LARGEST MEAL   GLUCOSE BLOOD (FREESTYLE LITE) TEST STRIP    Use to check glucose levels in the morning fasting (before eating) and 2 hours after largest meal   LANCET DEVICES MISC    Use to check glucose levels in the morning fasting (before eating) and 2 hours after largest meal   METFORMIN (GLUCOPHAGE) 500 MG TABLET    2 tablets twice daily    Review of Systems: General:   Denies fever, chills, unexplained weight loss.  Optho/Auditory:   Denies visual changes, blurred vision/LOV Respiratory:   Denies SOB, DOE more than baseline levels.  Cardiovascular:   Denies chest pain, palpitations, new onset peripheral edema  Gastrointestinal:   Denies nausea, vomiting, diarrhea.  Genitourinary: Denies dysuria, freq/ urgency, flank pain or discharge from genitals.  Endocrine:     Denies hot or cold intolerance, polyuria, polydipsia. Musculoskeletal:   + myalgias, denies joint swelling, + arthralgias, denies gait problems.  Skin:  Denies rash, suspicious lesions Neurological:     Denies  dizziness, unexplained weakness, numbness  Psychiatric/Behavioral:   Denies mood changes, suicidal or homicidal ideations, hallucinations    Objective:  Blood pressure 132/89, pulse 64, height '5\' 8"'  (1.727 m), weight 227 lb (103 kg), last menstrual period 01/03/2012, SpO2 97 %. Body mass index is 34.52 kg/m.   Gen: A & O *3,  No acute distress HEENT:  [place]/AT Back Exam:  Essentially normal.  Subjectively, patient was unable to flex at all due to fear of pain, but all LE and straight leg raise was negative.  Negative for neurological symptoms. Inspection: Unremarkable  Motion: Flexion 45 deg, Extension 45 deg, Side Bending to 45 deg bilaterally,  Rotation to 45 deg bilaterally  SLR laying: Negative  XSLR laying: Negative  Palpable tenderness: To light touch over L4-S1 region midline. FABER: negative. Sensory change: Gross sensation intact to all lumbar and sacral dermatomes.  Reflexes: 2+ at both patellar tendons, 2+ at achilles tendons, Babinski's downgoing.  Strength at foot  Plantar-flexion: 5/5 Dorsi-flexion: 5/5 Eversion: 5/5 Inversion: 5/5  Leg strength  Quad: 5/5 Hamstring: 5/5 Hip flexor: 5/5 Hip abductors: 5/5  Gait unremarkable.

## 2018-02-07 NOTE — Patient Instructions (Signed)
If you change your mind and decide to go to physical therapy please let us know.  Also if you change your mind he would like to go see a back specialist, please let us know.    Back Pain, Adult Many adults have back pain from time to time. Common causes of back pain include:  A strained muscle or ligament.  Wear and tear (degeneration) of the spinal disks.  Arthritis.  A hit to the back.  Back pain can be short-lived (acute) or last a long time (chronic). A physical exam, lab tests, and imaging studies may be done to find the cause of your pain. Follow these instructions at home: Managing pain and stiffness  Take over-the-counter and prescription medicines only as told by your health care provider.  If directed, apply heat to the affected area as often as told by your health care provider. Use the heat source that your health care provider recommends, such as a moist heat pack or a heating pad. ? Place a towel between your skin and the heat source. ? Leave the heat on for 20-30 minutes. ? Remove the heat if your skin turns bright red. This is especially important if you are unable to feel pain, heat, or cold. You have a greater risk of getting burned.  If directed, apply ice to the injured area: ? Put ice in a plastic bag. ? Place a towel between your skin and the bag. ? Leave the ice on for 20 minutes, 2-3 times a day for the first 2-3 days. Activity  Do not stay in bed. Resting more than 1-2 days can delay your recovery.  Take short walks on even surfaces as soon as you are able. Try to increase the length of time you walk each day.  Do not sit, drive, or stand in one place for more than 30 minutes at a time. Sitting or standing for long periods of time can put stress on your back.  Use proper lifting techniques. When you bend and lift, use positions that put less stress on your back: ? Winger your knees. ? Keep the load close to your body. ? Avoid twisting.  Exercise  regularly as told by your health care provider. Exercising will help your back heal faster. This also helps prevent back injuries by keeping muscles strong and flexible.  Your health care provider may recommend that you see a physical therapist. This person can help you come up with a safe exercise program. Do any exercises as told by your physical therapist. Lifestyle  Maintain a healthy weight. Extra weight puts stress on your back and makes it difficult to have good posture.  Avoid activities or situations that make you feel anxious or stressed. Learn ways to manage anxiety and stress. One way to manage stress is through exercise. Stress and anxiety increase muscle tension and can make back pain worse. General instructions  Sleep on a firm mattress in a comfortable position. Try lying on your side with your knees slightly bent. If you lie on your back, put a pillow under your knees.  Follow your treatment plan as told by your health care provider. This may include: ? Cognitive or behavioral therapy. ? Acupuncture or massage therapy. ? Meditation or yoga. Contact a health care provider if:  You have pain that is not relieved with rest or medicine.  You have increasing pain going down into your legs or buttocks.  Your pain does not improve in 2 weeks.  You  have pain at night.  You lose weight.  You have a fever or chills. Get help right away if:  You develop new bowel or bladder control problems.  You have unusual weakness or numbness in your arms or legs.  You develop nausea or vomiting.  You develop abdominal pain.  You feel faint. Summary  Many adults have back pain from time to time. A physical exam, lab tests, and imaging studies may be done to find the cause of your pain.  Use proper lifting techniques. When you bend and lift, use positions that put less stress on your back.  Take over-the-counter and prescription medicines and apply heat or ice as directed by  your health care provider. This information is not intended to replace advice given to you by your health care provider. Make sure you discuss any questions you have with your health care provider. Document Released: 10/12/2005 Document Revised: 11/16/2016 Document Reviewed: 11/16/2016 Elsevier Interactive Patient Education  [employer].

## 2018-02-13 DIAGNOSIS — G8929 Other chronic pain: Secondary | ICD-10-CM | POA: Insufficient documentation

## 2018-02-13 DIAGNOSIS — M545 Low back pain, unspecified: Secondary | ICD-10-CM | POA: Insufficient documentation

## 2018-04-08 ENCOUNTER — Ambulatory Visit: Payer: 59 | Admitting: *Deleted

## 2018-05-03 ENCOUNTER — Ambulatory Visit: Payer: 59 | Admitting: *Deleted

## 2018-07-11 ENCOUNTER — Telehealth: Payer: Self-pay | Admitting: *Deleted

## 2018-07-11 DIAGNOSIS — E1100 Type 2 diabetes mellitus with hyperosmolarity without nonketotic hyperglycemic-hyperosmolar coma (NKHHC): Secondary | ICD-10-CM

## 2018-07-11 NOTE — Telephone Encounter (Signed)
Yes, okay for referral

## 2018-07-11 NOTE — Telephone Encounter (Signed)
Patient informed, referral placed they will call to schedule.

## 2018-07-11 NOTE — Telephone Encounter (Signed)
Patient called has diabetes mellitus type 2, no longer has PCP, would like referral to Dr. Loanne Drilling to help manage this, okay to refer?

## 2018-07-13 NOTE — Telephone Encounter (Signed)
Appointment on 08/03/18 Dr. Loanne Drilling

## 2018-07-21 ENCOUNTER — Encounter (HOSPITAL_COMMUNITY): Payer: Self-pay | Admitting: Emergency Medicine

## 2018-07-21 ENCOUNTER — Ambulatory Visit (HOSPITAL_COMMUNITY)
Admission: EM | Admit: 2018-07-21 | Discharge: 2018-07-21 | Disposition: A | Discharge status: Home / Self Care | Payer: 59 | Attending: Family Medicine | Admitting: Family Medicine

## 2018-07-21 DIAGNOSIS — B9689 Other specified bacterial agents as the cause of diseases classified elsewhere: Secondary | ICD-10-CM

## 2018-07-21 DIAGNOSIS — J329 Chronic sinusitis, unspecified: Secondary | ICD-10-CM | POA: Diagnosis not present

## 2018-07-21 MED ORDER — AMOXICILLIN-POT CLAVULANATE 875-125 MG PO TABS: 1.0000 | ORAL_TABLET | Freq: Two times a day (BID) | ORAL | 0 refills | Status: DC

## 2018-07-21 MED ORDER — FLUTICASONE PROPIONATE 50 MCG/ACT NA SUSP: 1.0000 | Freq: Every day | NASAL | 2 refills | Status: DC

## 2018-07-21 NOTE — ED Notes (Signed)
Bed: UC01 Expected date: 07/21/18 Expected time:  Means of arrival:  Comments: For APPTS

## 2018-07-21 NOTE — ED Provider Notes (Signed)
[facility]    CSN: [id number] Arrival date & time: 07/21/18  12/28/1636     History   Chief Complaint Chief Complaint  Patient presents with  . URI    HPI [name] is a 52 y.o. female.   Patient is a 52 year old female past medical history of diabetes who presents with 4 weeks of sinus congestion.  Initially she was having URI symptoms but then it worsened to severe sinus congestion with thick mucus.  In the last week she has had low-grade fever, chills, body aches and severe pressure in head and sinuses.  She has been taking Mucinex DM, Alka-Seltzer, NyQuil, DayQuil, cold and sinus without much relief of symptoms.  She is also been doing nasal saline sprays.  She denies any history of sinus infections.   ROS per HPI      Past Medical History:  Diagnosis Date  . Acute meniscal tear of left knee   . History of hypothyroidism   . Prediabetes 2017/09/21   Elevated blood glucose since 2011-12-29 possibly prior.    Patient Active Problem List   Diagnosis Date Noted  . Chronic midline low back pain without sciatica 02/13/2018  . Mixed diabetic hyperlipidemia associated with type 2 diabetes mellitus ([place]) 10/25/2017  . Hypertension associated with diabetes ([place]) 10/25/2017  . Diabetes mellitus, type II ([place]) 10/25/2017  . H/O total hysterectomy- benign reasons 09/21/17  . Left knee pain- Dr Amedeo Plenty 09/21/2017  . Perimenopausal September 21, 2017  . Family history of breast cancer in mother-deceased late Dec 29, 2022 09-21-17  . Family history of colon cancer- MGF- in 60's 2017-09-21  . Blood glucose elevated 09-21-2017  . Elevated alanine aminotransferase (ALT) level Sep 21, 2017  . Low HDL (under 40) 21-Sep-2017  . Severe obesity (BMI 35.0-35.9 with comorbidity) ([place]) Sep 21, 2017  . Vitamin D deficiency 09/21/17  . Family history of diabetes mellitus (DM)- in daughter 09-21-17  . Hypothyroidism 02/08/2013    Past Surgical History:  Procedure Laterality Date  .  [place] ENDOMETRIAL ABLATION  03-16-2007  . KNEE ARTHROSCOPY Right   . KNEE ARTHROSCOPY WITH MEDIAL MENISECTOMY Left 05/17/2015   Procedure: LEFT KNEE ARTHROSCOPY WITH MEDIAL MENISECTOMY, CHONDROPLASTY PATELLA;  Surgeon: Latanya Maudlin, MD;  Location: [facility];  Service: Orthopedics;  Laterality: Left;  . LAPAROSOCPY BILATERAL TUBAL LIGATION W/ CAUTERIZATION  10-15-2000  . VAGINAL HYSTERECTOMY  01/25/2012   Procedure: HYSTERECTOMY VAGINAL;  Surgeon: Anastasio Auerbach, MD;  Location: [facility];  Service: Gynecology;  Laterality: N/A;    OB History    Gravida  7   Para  4   Term      Preterm      AB  3   Living        SAB  3   TAB      Ectopic      Multiple      Live Births               Home Medications    Prior to Admission medications   Medication Sig Start Date End Date Taking? Authorizing Provider  metFORMIN (GLUCOPHAGE) 500 MG tablet 2 tablets twice daily 11/22/17  Yes Opalski, Deborah, DO  amoxicillin-clavulanate (AUGMENTIN) 875-125 MG tablet Take 1 tablet by mouth every 12 (twelve) hours. 07/21/18   Loura Halt A, NP  blood glucose meter kit and supplies KIT Dispense based on patient and insurance preference. Use up to four times daily as directed. (FOR ICD-9 250.00, 250.01).  Use to check glucose  levels in the morning fasting (before eating) and 2 hours after largest meal 11/22/17   Opalski, Deborah, DO  Blood Glucose Monitoring Suppl (FREESTYLE LITE) DEVI USE TO CHECK GLUCOSE LEVEL IN THE MORNING (BEFORE EATING) AND 2 HOURS AFTER LARGEST MEAL 12/31/17   Opalski, Neoma Laming, DO  cyclobenzaprine (FLEXERIL) 10 MG tablet Take 1 tablet (10 mg total) by mouth 3 (three) times daily as needed for muscle spasms. 02/07/18   Opalski, Deborah, DO  fluticasone (FLONASE) 50 MCG/ACT nasal spray Place 1 spray into both nostrils daily. 07/21/18   Loura Halt A, NP  glucose blood (FREESTYLE LITE) test strip Use to check glucose levels in the morning  fasting (before eating) and 2 hours after largest meal 11/22/17   Opalski, Deborah, DO  Ibuprofen-Famotidine 800-26.6 MG TABS Take 1 tablet by mouth 3 (three) times daily as needed. 02/07/18   Mellody Dance, DO  Lancet Devices MISC Use to check glucose levels in the morning fasting (before eating) and 2 hours after largest meal 11/22/17   Mellody Dance, DO    Family History Family History  Problem Relation Age of Onset  . Hypertension Mother   . Cancer Mother        LUG, THROAT, & BACK  . Breast cancer Mother   . Cancer Father        THROAT  . Throat cancer Father   . Cancer Maternal Grandmother        KIDNEY  . Cancer Maternal Grandfather        COLON  . Colon cancer Maternal Grandfather 46  . Stomach cancer Neg Hx     Social History Social History   Tobacco Use  . Smoking status: Never Smoker  . Smokeless tobacco: Never Used  Substance Use Topics  . Alcohol use: No    Alcohol/week: 0.0 standard drinks  . Drug use: No     Allergies   Adhesive [tape]   Review of Systems Review of Systems   Physical Exam Triage Vital Signs ED Triage Vitals  Enc Vitals Group     BP 07/21/18 1710 (!) 141/79     Pulse Rate 07/21/18 1710 84     Resp 07/21/18 1710 16     Temp 07/21/18 1710 99.4 F (37.4 C)     Temp Source 07/21/18 1710 Oral     SpO2 07/21/18 1710 96 %     Weight 07/21/18 1709 220 lb (99.8 kg)     Height --      Head Circumference --      Peak Flow --      Pain Score 07/21/18 1709 7     Pain Loc --      Pain Edu? --      Excl. in [place]? --    No data found.  Updated Vital Signs BP (!) 141/79   Pulse 84   Temp 99.4 F (37.4 C) (Oral)   Resp 16   Wt 220 lb (99.8 kg)   LMP 01/03/2012   SpO2 96%   BMI 33.45 kg/m   Visual Acuity Right Eye Distance:   Left Eye Distance:   Bilateral Distance:    Right Eye Near:   Left Eye Near:    Bilateral Near:     Physical Exam  Constitutional: She appears well-developed and well-nourished.  Patient is  ill-appearing  HENT:  Bilateral TMs normal.  External ears normal.  Without posterior oropharyngeal erythema, tonsillar swelling or exudates. No lesions.  Severe nasal turbinate swelling with mucus  noted.  Frontal and maxillary tenderness No lymphadenopathy.     Eyes: Conjunctivae are normal.  Neck: Normal range of motion.  Cardiovascular: Regular rhythm.  Mild tachycardia  Pulmonary/Chest: Effort normal and breath sounds normal.  Lungs clear in all fields. No dyspnea or distress. No retractions or nasal flaring.     Musculoskeletal: Normal range of motion.  Neurological: She is alert.  Skin: Skin is warm and dry.  Psychiatric: She has a normal mood and affect.  Nursing note and vitals reviewed.    UC Treatments / Results  Labs (all labs ordered are listed, but only abnormal results are displayed) Labs Reviewed - No data to display  EKG None  Radiology No results found.  Procedures Procedures (including critical care time)  Medications Ordered in UC Medications - No data to display  Initial Impression / Assessment and Plan / UC Course  I have reviewed the triage vital signs and the nursing notes.  Pertinent labs & imaging results that were available during my care of the patient were reviewed by me and considered in my medical decision making (see chart for details).     We will go ahead and treat for bacterial sinusitis based on ill appearance, low-grade fever, length of symptoms.  Augmentin for sinusitis and Flonase nasal spray Tylenol/ibuprofen for pain/fever Continue the Mucinex DM Follow-up for continued or worsening symptoms Final Clinical Impressions(s) / UC Diagnoses   Final diagnoses:  Bacterial sinusitis     Discharge Instructions     It was nice meeting you!!  We will go ahead and treat you for bacterial sinus infection with Augmentin. I am also been prescribed Flonase nasal spray to use daily Continue the Mucinex DM Tylenol or ibuprofen  for pain/fever    ED Prescriptions    Medication Sig Dispense Auth. Provider   amoxicillin-clavulanate (AUGMENTIN) 875-125 MG tablet Take 1 tablet by mouth every 12 (twelve) hours. 14 tablet Renalda Locklin A, NP   fluticasone (FLONASE) 50 MCG/ACT nasal spray Place 1 spray into both nostrils daily. 16 g Loura Halt A, NP     Controlled Substance Prescriptions [place] Controlled Substance Registry consulted? Not Applicable   Orvan July, NP 07/21/18 1732    Wynona Luna, MD 08/02/18 [phone]

## 2018-07-21 NOTE — ED Triage Notes (Signed)
PT reports sinus issues for 4 weeks. Pt reports nasal congestion, sinus pressure, headache, earrache, sore throat, body aches.

## 2018-07-21 NOTE — Discharge Instructions (Addendum)
It was nice meeting you!!  We will go ahead and treat you for bacterial sinus infection with Augmentin. I am also been prescribed Flonase nasal spray to use daily Continue the Mucinex DM Tylenol or ibuprofen for pain/fever

## 2018-08-03 ENCOUNTER — Ambulatory Visit: Payer: 59 | Admitting: Endocrinology

## 2018-08-03 ENCOUNTER — Encounter: Payer: Self-pay | Admitting: Endocrinology

## 2018-08-03 VITALS — BP 132/90 | HR 99 | Ht 68.0 in | Wt 210.2 lb

## 2018-08-03 DIAGNOSIS — E1100 Type 2 diabetes mellitus with hyperosmolarity without nonketotic hyperglycemic-hyperosmolar coma (NKHHC): Secondary | ICD-10-CM | POA: Diagnosis not present

## 2018-08-03 LAB — POCT GLYCOSYLATED HEMOGLOBIN (HGB A1C): Hemoglobin A1C: 13.5 % — AB (ref 4.0–5.6)

## 2018-08-03 MED ORDER — INSULIN GLARGINE 100 UNIT/ML SOLOSTAR PEN: 20.0000 [IU] | PEN_INJECTOR | SUBCUTANEOUS | 99 refills | Status: DC

## 2018-08-03 NOTE — Patient Instructions (Addendum)
good diet and exercise significantly improve the control of your diabetes.  please let me know if you wish to be referred to a dietician.  high blood sugar is very risky to your health.  you should see an eye doctor and dentist every year.  It is very important to get all recommended vaccinations.  Controlling your blood pressure and cholesterol drastically reduces the damage diabetes does to your body.  Those who smoke should quit.  Please discuss these with your doctor.  check your blood sugar twice a day.  vary the time of day when you check, between before the 3 meals, and at bedtime.  also check if you have symptoms of your blood sugar being too high or too low.  please keep a record of the readings and bring it to your next appointment here (or you can bring the meter itself).  You can write it on any piece of paper.  please call us sooner if your blood sugar goes below 70, or if you have a lot of readings over 200. I have sent a prescription to your pharmacy, to start 20 units of insulin each morning.  I hope you won't need this permanently. We can add other medications, to reduce the need for insulin.   Please come back for a follow-up appointment in 2-4 weeks.  Please continue to drink plenty of fluids.  This helps lower your blood sugar.

## 2018-08-03 NOTE — Progress Notes (Signed)
Subjective:    Patient ID: [name], female    DOB: 16-Mar-1966, 52 y.o.   MRN: [id number]  HPI pt is referred by Dr Raliegh Scarlet, for diabetes.  Pt states DM was dx'ed in 2018; she has mild if any neuropathy of the lower extremities; she is unaware of any associated chronic complications; she has never been on insulin; pt says her diet is good, and exercise is fair; she has never had GDM, pancreatitis, pancreatic surgery, severe hypoglycemia or DKA.  She stopped metformin, due to back pain. She does not check cbg's, but she has a meter and strips.   Past Medical History:  Diagnosis Date  . Acute meniscal tear of left knee   . History of hypothyroidism   . Prediabetes 09/20/2017   Elevated blood glucose since 2013 possibly prior.    Past Surgical History:  Procedure Laterality Date  . [place] ENDOMETRIAL ABLATION  03-16-2007  . KNEE ARTHROSCOPY Right   . KNEE ARTHROSCOPY WITH MEDIAL MENISECTOMY Left 05/17/2015   Procedure: LEFT KNEE ARTHROSCOPY WITH MEDIAL MENISECTOMY, CHONDROPLASTY PATELLA;  Surgeon: Latanya Maudlin, MD;  Location: [facility];  Service: Orthopedics;  Laterality: Left;  . LAPAROSOCPY BILATERAL TUBAL LIGATION W/ CAUTERIZATION  10-15-2000  . VAGINAL HYSTERECTOMY  01/25/2012   Procedure: HYSTERECTOMY VAGINAL;  Surgeon: Anastasio Auerbach, MD;  Location: [facility];  Service: Gynecology;  Laterality: N/A;    Social History   Socioeconomic History  . Marital status: Married    Spouse name: Not on file  . Number of children: Not on file  . Years of education: Not on file  . Highest education level: Not on file  Occupational History  . Not on file  Social Needs  . Financial resource strain: Not on file  . Food insecurity:    Worry: Not on file    Inability: Not on file  . Transportation needs:    Medical: Not on file    Non-medical: Not on file  Tobacco Use  . Smoking status: Never Smoker  . Smokeless tobacco: Never Used    Substance and Sexual Activity  . Alcohol use: No    Alcohol/week: 0.0 standard drinks  . Drug use: No  . Sexual activity: Not Currently    Comment: DES NEG,DECLINED INSURANCE QUESTIONS  Lifestyle  . Physical activity:    Days per week: Not on file    Minutes per session: Not on file  . Stress: Not on file  Relationships  . Social connections:    Talks on phone: Not on file    Gets together: Not on file    Attends religious service: Not on file    Active member of club or organization: Not on file    Attends meetings of clubs or organizations: Not on file    Relationship status: Not on file  . Intimate partner violence:    Fear of current or ex partner: Not on file    Emotionally abused: Not on file    Physically abused: Not on file    Forced sexual activity: Not on file  Other Topics Concern  . Not on file  Social History Narrative  . Not on file    Current Outpatient Medications on File Prior to Visit  Medication Sig Dispense Refill  . blood glucose meter kit and supplies KIT Dispense based on patient and insurance preference. Use up to four times daily as directed. (FOR ICD-9 250.00, 250.01).  Use to check glucose levels in  the morning fasting (before eating) and 2 hours after largest meal 1 each 0  . Blood Glucose Monitoring Suppl (FREESTYLE LITE) DEVI USE TO CHECK GLUCOSE LEVEL IN THE MORNING (BEFORE EATING) AND 2 HOURS AFTER LARGEST MEAL 1 each 0  . fluticasone (FLONASE) 50 MCG/ACT nasal spray Place 1 spray into both nostrils daily. 16 g 2  . glucose blood (FREESTYLE LITE) test strip Use to check glucose levels in the morning fasting (before eating) and 2 hours after largest meal 100 each 12  . Lancet Devices MISC Use to check glucose levels in the morning fasting (before eating) and 2 hours after largest meal 100 each 6   Current Facility-Administered Medications on File Prior to Visit  Medication Dose Route Frequency Provider Last Rate Last Dose  . 0.9 %  sodium  chloride infusion  500 mL Intravenous Once Doran Stabler, MD        Allergies  Allergen Reactions  . Adhesive [Tape] Rash    Family History  Problem Relation Age of Onset  . Hypertension Mother   . Cancer Mother        LUG, THROAT, & BACK  . Breast cancer Mother   . Cancer Father        THROAT  . Throat cancer Father   . Cancer Maternal Grandmother        KIDNEY  . Cancer Maternal Grandfather        COLON  . Colon cancer Maternal Grandfather 81  . Diabetes Daughter   . Stomach cancer Neg Hx     BP 132/90 (BP Location: Right Arm, Patient Position: Sitting)   Pulse 99   Ht '5\' 8"'  (1.727 m)   Wt 210 lb 3.2 oz (95.3 kg)   LMP 01/03/2012   SpO2 97%   BMI 31.96 kg/m    Review of Systems denies blurry vision, headache, chest pain, sob, n/v, muscle cramps, excessive diaphoresis, memory loss, depression, cold intolerance, rhinorrhea, and easy bruising.  She has dry mouth and frequent urination.  She has lost 30 lbs x a few months.       Objective:   Physical Exam VS: see vs page GEN: no distress HEAD: head: no deformity eyes: no periorbital swelling, no proptosis external nose and ears are normal mouth: no lesion seen NECK: supple, thyroid is not enlarged CHEST WALL: no deformity LUNGS: clear to auscultation CV: reg rate and rhythm, no murmur ABD: abdomen is soft, nontender.  no hepatosplenomegaly.  not distended.  no hernia MUSCULOSKELETAL: muscle bulk and strength are grossly normal.  no obvious joint swelling.  gait is normal and steady EXTEMITIES: no deformity.  no ulcer on the feet.  feet are of normal color and temp.  no edema PULSES: dorsalis pedis intact bilat.  no carotid bruit NEURO:  cn 2-12 grossly intact.   readily moves all 4's.  sensation is intact to touch on the feet SKIN:  Normal texture and temperature.  No rash or suspicious lesion is visible.   NODES:  None palpable at the neck PSYCH: alert, well-oriented.  Does not appear anxious nor  depressed.  Lab Results  Component Value Date   HGBA1C 13.5 (A) 08/03/2018   Lab Results  Component Value Date   CREATININE 0.98 10/12/2017   BUN 13 10/12/2017   NA 136 10/12/2017   K 4.7 10/12/2017   CL 101 10/12/2017   CO2 25 10/12/2017   I have reviewed outside records, and summarized: Pt was noted to have elevated  a1c, and referred here.  She has been seen in ER for several different MSK probs, but not for DM     Assessment & Plan:  Type 2 DM: severe exacerbation.  We discussed.  She agrees to start insulin Weight loss, prob due to severe hyperglycemia: we'll follow Dry mouth: I encouraged pt to drink plenty of fluids.  Noncompliance with cbg recording: we discussed. Pt says she'll start checking cbg's  Patient Instructions  good diet and exercise significantly improve the control of your diabetes.  please let me know if you wish to be referred to a dietician.  high blood sugar is very risky to your health.  you should see an eye doctor and dentist every year.  It is very important to get all recommended vaccinations.  Controlling your blood pressure and cholesterol drastically reduces the damage diabetes does to your body.  Those who smoke should quit.  Please discuss these with your doctor.  check your blood sugar twice a day.  vary the time of day when you check, between before the 3 meals, and at bedtime.  also check if you have symptoms of your blood sugar being too high or too low.  please keep a record of the readings and bring it to your next appointment here (or you can bring the meter itself).  You can write it on any piece of paper.  please call us sooner if your blood sugar goes below 70, or if you have a lot of readings over 200. I have sent a prescription to your pharmacy, to start 20 units of insulin each morning.  I hope you won't need this permanently. We can add other medications, to reduce the need for insulin.   Please come back for a follow-up appointment in 2-4  weeks.  Please continue to drink plenty of fluids.  This helps lower your blood sugar.

## 2018-08-12 ENCOUNTER — Encounter: Payer: Self-pay | Admitting: Dietician

## 2018-08-12 ENCOUNTER — Encounter: Payer: 59 | Attending: Endocrinology | Admitting: Dietician

## 2018-08-12 DIAGNOSIS — Z713 Dietary counseling and surveillance: Secondary | ICD-10-CM | POA: Insufficient documentation

## 2018-08-12 DIAGNOSIS — E1100 Type 2 diabetes mellitus with hyperosmolarity without nonketotic hyperglycemic-hyperosmolar coma (NKHHC): Secondary | ICD-10-CM | POA: Diagnosis not present

## 2018-08-12 NOTE — Patient Instructions (Signed)
Increase your Lantus to 40 units each morning per Dr. Cordelia Pen recommendation today. Continue to keep blood sugar records and bring these to your appointment. Follow up with Dr. Loanne Drilling next week. Be sure to rotate your insulin injection sites.  Continue to be mindful about your diet choices. Choose beverages without carbohydrates.

## 2018-08-12 NOTE — Progress Notes (Signed)
Diabetes Self-Management Education  Visit Type: Follow-up  Appt. Start Time: 1345 Appt. End Time: 7510  08/12/2018  [name], identified by name and date of birth, is a 52 y.o. female with a diagnosis of Diabetes: Type 2. Patient is here today with her daughter for insulin instruction.  She has been taking 20 units of Lantus for the past week.  AM CBG's remain greater than 250.  Discussed with Dr. Loanne Drilling.  She is to increase to 40 units each am.  She did not tolerated metformin due to increased back pain.    Currently she complains of increased constipation.  Discussed increasing non-starchy vegetables to help with this.  190 lbs lowest adult weight 2017 274 lbs highest adult weight 2013.  Lost with running, working out and watching her intake.  She was diagnosed with prediabetes.  Then developed knee problems and started water aerobics.  Lives with husband and 2 sons.  She does the shopping and cooking.  She is not working.  Used to run half [place].  Has taught swimming in the past.  Has now had a double knee replacement and unable to exercise the same. ASSESSMENT  Weight 212 lb (96.2 kg), last menstrual period 01/03/2012. Body mass index is 32.23 kg/m.  Diabetes Self-Management Education - 08/12/18 1415      Visit Information   Visit Type  Follow-up      Psychosocial Assessment   What is the last grade level you completed in school?  1 year college      Pre-Education Assessment   Patient understands the diabetes disease and treatment process.  Demonstrates understanding / competency    Patient understands incorporating nutritional management into lifestyle.  Needs Review    Patient undertands incorporating physical activity into lifestyle.  Demonstrates understanding / competency    Patient understands using medications safely.  Demonstrates understanding / competency    Patient understands monitoring blood glucose, interpreting and using results  Needs  Instruction    Patient understands prevention, detection, and treatment of acute complications.  Demonstrates understanding / competency    Patient understands prevention, detection, and treatment of chronic complications.  Demonstrates understanding / competency    Patient understands how to develop strategies to address psychosocial issues.  Demonstrates understanding / competency    Patient understands how to develop strategies to promote health/change behavior.  Needs Review      Complications   Last HgB A1C per patient/outside source  13.5 %   08/03/18   How often do you check your blood sugar?  1-2 times/day    Fasting Blood glucose range (mg/dL)  >200      Dietary Intake   Breakfast  protein shake OR scrambled egg, veges    Snack (morning)  none    Lunch  fried chicken, pickles    Snack (afternoon)  none OR occasional nuts    Dinner  salad with grilled chicken, ranch dressing    Snack (evening)  nuts    Beverage(s)  water, diet green tea  (had been drinking the regular soda again until Wednesday)      Exercise   Exercise Type  Light (walking / raking leaves)    How many days per week to you exercise?  7    How many minutes per day do you exercise?  60    Total minutes per week of exercise  420      Patient Education   Previous Diabetes Education  Yes (please comment)   Bev  7 & 8 months ago   Nutrition management   Meal options for control of blood glucose level and chronic complications.    Physical activity and exercise   Other (comment)   review   Medications  Reviewed patients medication for diabetes, action, purpose, timing of dose and side effects.;Taught/reviewed insulin injection, site rotation, insulin storage and needle disposal.    Monitoring  Taught/discussed recording of test results and interpretation of SMBG.;Identified appropriate SMBG and/or A1C goals.    Acute complications  Taught treatment of hypoglycemia - the 15 rule.      Individualized Goals  (developed by patient)   Nutrition  General guidelines for healthy choices and portions discussed    Physical Activity  Exercise 5-7 days per week;30 minutes per day    Medications  take my medication as prescribed    Reducing Risk  examine blood glucose patterns    Health Coping  discuss diabetes with (comment)   MD, RD, CDE     Patient Self-Evaluation of Goals - Patient rates self as meeting previously set goals (% of time)   Nutrition  50 - 75 %    Physical Activity  50 - 75 %    Medications  >75%    Monitoring  >75%    Problem Solving  50 - 75 %    Reducing Risk  >75%    Health Coping  50 - 75 %      Post-Education Assessment   Patient understands the diabetes disease and treatment process.  Demonstrates understanding / competency    Patient understands incorporating nutritional management into lifestyle.  Demonstrates understanding / competency    Patient undertands incorporating physical activity into lifestyle.  Demonstrates understanding / competency    Patient understands using medications safely.  Demonstrates understanding / competency    Patient understands monitoring blood glucose, interpreting and using results  Demonstrates understanding / competency    Patient understands prevention, detection, and treatment of acute complications.  Demonstrates understanding / competency    Patient understands prevention, detection, and treatment of chronic complications.  Demonstrates understanding / competency    Patient understands how to develop strategies to address psychosocial issues.  Demonstrates understanding / competency    Patient understands how to develop strategies to promote health/change behavior.  Needs Review      Outcomes   Expected Outcomes  Demonstrated interest in learning. Expect positive outcomes    Future DMSE  4-6 wks    Program Status  Completed      Subsequent Visit   Since your last visit, are you checking your blood glucose at least once a day?  Yes        Individualized Plan for Diabetes Self-Management Training:   Learning Objective:  Patient will have a greater understanding of diabetes self-management. Patient education plan is to attend individual and/or group sessions per assessed needs and concerns.   Plan:   Patient Instructions  Increase your Lantus to 40 units each morning per Dr. Cordelia Pen recommendation today. Continue to keep blood sugar records and bring these to your appointment. Follow up with Dr. Loanne Drilling next week. Be sure to rotate your insulin injection sites.  Continue to be mindful about your diet choices. Choose beverages without carbohydrates.    Expected Outcomes:  Demonstrated interest in learning. Expect positive outcomes  Education material provided: Meal plan card and My Plate  If problems or questions, patient to contact team via:  Phone  Future DSME appointment: 4-6 wks

## 2018-08-17 ENCOUNTER — Ambulatory Visit: Payer: 59 | Admitting: Endocrinology

## 2018-08-17 ENCOUNTER — Encounter: Payer: Self-pay | Admitting: Endocrinology

## 2018-08-17 VITALS — BP 134/86 | HR 70 | Ht 68.0 in | Wt 211.0 lb

## 2018-08-17 DIAGNOSIS — Z794 Long term (current) use of insulin: Secondary | ICD-10-CM | POA: Diagnosis not present

## 2018-08-17 DIAGNOSIS — E119 Type 2 diabetes mellitus without complications: Secondary | ICD-10-CM

## 2018-08-17 MED ORDER — INSULIN GLARGINE 100 UNIT/ML SOLOSTAR PEN: 50.0000 [IU] | PEN_INJECTOR | SUBCUTANEOUS | 99 refills | Status: DC

## 2018-08-17 NOTE — Progress Notes (Signed)
Subjective:    Patient ID: [name], female    DOB: 09-02-1966, 52 y.o.   MRN: [id number]  HPI Pt returns for f/u of diabetes mellitus: DM type: Insulin-requiring type 2.   Dx'ed: 9024 Complications: none Therapy: insulin since 2019 GDM: never DKA: never Severe hypoglycemia: never Pancreatitis: never Pancreatic imaging: never Other: due to h/o noncompliance, she is not a candidate for multiple daily injections; she did not tolerate metformin (back pain) Interval history: she brings a record of her cbg's which I have reviewed today.  She checks fasting.  Since insulin was increased to 40/d, cbg's are in the mid-100's.  pt states she feels well in general.   Past Medical History:  Diagnosis Date  . Acute meniscal tear of left knee   . Diabetes mellitus without complication ([place])   . History of hypothyroidism   . Prediabetes 09/20/2017   Elevated blood glucose since 2013 possibly prior.    Past Surgical History:  Procedure Laterality Date  . [place] ENDOMETRIAL ABLATION  03-16-2007  . KNEE ARTHROSCOPY Right   . KNEE ARTHROSCOPY WITH MEDIAL MENISECTOMY Left 05/17/2015   Procedure: LEFT KNEE ARTHROSCOPY WITH MEDIAL MENISECTOMY, CHONDROPLASTY PATELLA;  Surgeon: Latanya Maudlin, MD;  Location: [facility];  Service: Orthopedics;  Laterality: Left;  . LAPAROSOCPY BILATERAL TUBAL LIGATION W/ CAUTERIZATION  10-15-2000  . VAGINAL HYSTERECTOMY  01/25/2012   Procedure: HYSTERECTOMY VAGINAL;  Surgeon: Anastasio Auerbach, MD;  Location: [facility];  Service: Gynecology;  Laterality: N/A;    Social History   Socioeconomic History  . Marital status: Married    Spouse name: Not on file  . Number of children: Not on file  . Years of education: Not on file  . Highest education level: Not on file  Occupational History  . Not on file  Social Needs  . Financial resource strain: Not on file  . Food insecurity:    Worry: Not on file    Inability: Not on  file  . Transportation needs:    Medical: Not on file    Non-medical: Not on file  Tobacco Use  . Smoking status: Never Smoker  . Smokeless tobacco: Never Used  Substance and Sexual Activity  . Alcohol use: No    Alcohol/week: 0.0 standard drinks  . Drug use: No  . Sexual activity: Not Currently    Comment: DES NEG,DECLINED INSURANCE QUESTIONS  Lifestyle  . Physical activity:    Days per week: Not on file    Minutes per session: Not on file  . Stress: Not on file  Relationships  . Social connections:    Talks on phone: Not on file    Gets together: Not on file    Attends religious service: Not on file    Active member of club or organization: Not on file    Attends meetings of clubs or organizations: Not on file    Relationship status: Not on file  . Intimate partner violence:    Fear of current or ex partner: Not on file    Emotionally abused: Not on file    Physically abused: Not on file    Forced sexual activity: Not on file  Other Topics Concern  . Not on file  Social History Narrative  . Not on file    Current Outpatient Medications on File Prior to Visit  Medication Sig Dispense Refill  . blood glucose meter kit and supplies KIT Dispense based on patient and insurance preference. Use  up to four times daily as directed. (FOR ICD-9 250.00, 250.01).  Use to check glucose levels in the morning fasting (before eating) and 2 hours after largest meal 1 each 0  . Blood Glucose Monitoring Suppl (FREESTYLE LITE) DEVI USE TO CHECK GLUCOSE LEVEL IN THE MORNING (BEFORE EATING) AND 2 HOURS AFTER LARGEST MEAL 1 each 0  . fluticasone (FLONASE) 50 MCG/ACT nasal spray Place 1 spray into both nostrils daily. 16 g 2  . glucose blood (FREESTYLE LITE) test strip Use to check glucose levels in the morning fasting (before eating) and 2 hours after largest meal 100 each 12  . [employer] MISC Use to check glucose levels in the morning fasting (before eating) and 2 hours after largest meal  100 each 6   Current Facility-Administered Medications on File Prior to Visit  Medication Dose Route Frequency Provider Last Rate Last Dose  . 0.9 %  sodium chloride infusion  500 mL Intravenous Once Doran Stabler, MD        Allergies  Allergen Reactions  . Adhesive [Tape] Rash    Family History  Problem Relation Age of Onset  . Hypertension Mother   . Cancer Mother        LUG, THROAT, & BACK  . Breast cancer Mother   . Cancer Father        THROAT  . Throat cancer Father   . Cancer Maternal Grandmother        KIDNEY  . Cancer Maternal Grandfather        COLON  . Colon cancer Maternal Grandfather 19  . Diabetes Daughter   . Stomach cancer Neg Hx     BP 134/86   Pulse 70   Ht '5\' 8"'  (1.727 m)   Wt 211 lb (95.7 kg)   LMP 01/03/2012   SpO2 98%   BMI 32.08 kg/m    Review of Systems She denies hypoglycemia.  Fatigue is less now.      Objective:   Physical Exam VITAL SIGNS:  See vs page GENERAL: no distress Pulses: dorsalis pedis intact bilat.   MSK: no deformity of the feet CV: no leg edema Skin:  no ulcer on the feet.  normal color and temp on the feet. Neuro: sensation is intact to touch on the feet     Assessment & Plan:  Insulin-requiring type 2 DM: she needs increased rx   Patient Instructions  check your blood sugar twice a day.  vary the time of day when you check, between before the 3 meals, and at bedtime.  also check if you have symptoms of your blood sugar being too high or too low.  please keep a record of the readings and bring it to your next appointment here (or you can bring the meter itself).  You can write it on any piece of paper.  please call us sooner if your blood sugar goes below 70, or if you have a lot of readings over 200.   I have sent a prescription to your pharmacy, to increase to 50 units of insulin each morning.    Please come back for a follow-up appointment in 6 weeks.

## 2018-08-17 NOTE — Patient Instructions (Addendum)
check your blood sugar twice a day.  vary the time of day when you check, between before the 3 meals, and at bedtime.  also check if you have symptoms of your blood sugar being too high or too low.  please keep a record of the readings and bring it to your next appointment here (or you can bring the meter itself).  You can write it on any piece of paper.  please call us sooner if your blood sugar goes below 70, or if you have a lot of readings over 200.   I have sent a prescription to your pharmacy, to increase to 50 units of insulin each morning.    Please come back for a follow-up appointment in 6 weeks.

## 2018-09-05 ENCOUNTER — Ambulatory Visit: Payer: 59 | Admitting: Women's Health

## 2018-09-05 ENCOUNTER — Encounter: Payer: Self-pay | Admitting: Women's Health

## 2018-09-05 VITALS — BP 128/90 | Ht 67.5 in | Wt 214.0 lb

## 2018-09-05 DIAGNOSIS — Z01419 Encounter for gynecological examination (general) (routine) without abnormal findings: Secondary | ICD-10-CM

## 2018-09-05 NOTE — Patient Instructions (Signed)
Health Maintenance for Postmenopausal Women Menopause is a normal process in which your reproductive ability comes to an end. This process happens gradually over a span of months to years, usually between the ages of 22 and 9. Menopause is complete when you have missed 12 consecutive menstrual periods. It is important to talk with your health care provider about some of the most common conditions that affect postmenopausal women, such as heart disease, cancer, and bone loss (osteoporosis). Adopting a healthy lifestyle and getting preventive care can help to promote your health and wellness. Those actions can also lower your chances of developing some of these common conditions. What should I know about menopause? During menopause, you may experience a number of symptoms, such as:  Moderate-to-severe hot flashes.  Night sweats.  Decrease in sex drive.  Mood swings.  Headaches.  Tiredness.  Irritability.  Memory problems.  Insomnia.  Choosing to treat or not to treat menopausal changes is an individual decision that you make with your health care provider. What should I know about hormone replacement therapy and supplements? Hormone therapy products are effective for treating symptoms that are associated with menopause, such as hot flashes and night sweats. Hormone replacement carries certain risks, especially as you become older. If you are thinking about using estrogen or estrogen with progestin treatments, discuss the benefits and risks with your health care provider. What should I know about heart disease and stroke? Heart disease, heart attack, and stroke become more likely as you age. This may be due, in part, to the hormonal changes that your body experiences during menopause. These can affect how your body processes dietary fats, triglycerides, and cholesterol. Heart attack and stroke are both medical emergencies. There are many things that you can do to help prevent heart disease  and stroke:  Have your blood pressure checked at least every 1-2 years. High blood pressure causes heart disease and increases the risk of stroke.  If you are 53-22 years old, ask your health care provider if you should take aspirin to prevent a heart attack or a stroke.  Do not use any tobacco products, including cigarettes, chewing tobacco, or electronic cigarettes. If you need help quitting, ask your health care provider.  It is important to eat a healthy diet and maintain a healthy weight. ? Be sure to include plenty of vegetables, fruits, low-fat dairy products, and lean protein. ? Avoid eating foods that are high in solid fats, added sugars, or salt (sodium).  Get regular exercise. This is one of the most important things that you can do for your health. ? Try to exercise for at least 150 minutes each week. The type of exercise that you do should increase your heart rate and make you sweat. This is known as moderate-intensity exercise. ? Try to do strengthening exercises at least twice each week. Do these in addition to the moderate-intensity exercise.  Know your numbers.Ask your health care provider to check your cholesterol and your blood glucose. Continue to have your blood tested as directed by your health care provider.  What should I know about cancer screening? There are several types of cancer. Take the following steps to reduce your risk and to catch any cancer development as early as possible. Breast Cancer  Practice breast self-awareness. ? This means understanding how your breasts normally appear and feel. ? It also means doing regular breast self-exams. Let your health care provider know about any changes, no matter how small.  If you are 40  or older, have a clinician do a breast exam (clinical breast exam or CBE) every year. Depending on your age, family history, and medical history, it may be recommended that you also have a yearly breast X-ray (mammogram).  If you  have a family history of breast cancer, talk with your health care provider about genetic screening.  If you are at high risk for breast cancer, talk with your health care provider about having an MRI and a mammogram every year.  Breast cancer (BRCA) gene test is recommended for women who have family members with BRCA-related cancers. Results of the assessment will determine the need for genetic counseling and BRCA1 and for BRCA2 testing. BRCA-related cancers include these types: ? Breast. This occurs in males or females. ? Ovarian. ? Tubal. This may also be called fallopian tube cancer. ? Cancer of the abdominal or pelvic lining (peritoneal cancer). ? Prostate. ? Pancreatic.  Cervical, Uterine, and Ovarian Cancer Your health care provider may recommend that you be screened regularly for cancer of the pelvic organs. These include your ovaries, uterus, and vagina. This screening involves a pelvic exam, which includes checking for microscopic changes to the surface of your cervix (Pap test).  For women ages 21-65, health care providers may recommend a pelvic exam and a Pap test every three years. For women ages 79-65, they may recommend the Pap test and pelvic exam, combined with testing for human papilloma virus (HPV), every five years. Some types of HPV increase your risk of cervical cancer. Testing for HPV may also be done on women of any age who have unclear Pap test results.  Other health care providers may not recommend any screening for nonpregnant women who are considered low risk for pelvic cancer and have no symptoms. Ask your health care provider if a screening pelvic exam is right for you.  If you have had past treatment for cervical cancer or a condition that could lead to cancer, you need Pap tests and screening for cancer for at least 20 years after your treatment. If Pap tests have been discontinued for you, your risk factors (such as having a new sexual partner) need to be  reassessed to determine if you should start having screenings again. Some women have medical problems that increase the chance of getting cervical cancer. In these cases, your health care provider may recommend that you have screening and Pap tests more often.  If you have a family history of uterine cancer or ovarian cancer, talk with your health care provider about genetic screening.  If you have vaginal bleeding after reaching menopause, tell your health care provider.  There are currently no reliable tests available to screen for ovarian cancer.  Lung Cancer Lung cancer screening is recommended for adults 69-62 years old who are at high risk for lung cancer because of a history of smoking. A yearly low-dose CT scan of the lungs is recommended if you:  Currently smoke.  Have a history of at least 30 pack-years of smoking and you currently smoke or have quit within the past 15 years. A pack-year is smoking an average of one pack of cigarettes per day for one year.  Yearly screening should:  Continue until it has been 15 years since you quit.  Stop if you develop a health problem that would prevent you from having lung cancer treatment.  Colorectal Cancer  This type of cancer can be detected and can often be prevented.  Routine colorectal cancer screening usually begins at  age 42 and continues through age 45.  If you have risk factors for colon cancer, your health care provider may recommend that you be screened at an earlier age.  If you have a family history of colorectal cancer, talk with your health care provider about genetic screening.  Your health care provider may also recommend using home test kits to check for hidden blood in your stool.  A small camera at the end of a tube can be used to examine your colon directly (sigmoidoscopy or colonoscopy). This is done to check for the earliest forms of colorectal cancer.  Direct examination of the colon should be repeated every  5-10 years until age 71. However, if early forms of precancerous polyps or small growths are found or if you have a family history or genetic risk for colorectal cancer, you may need to be screened more often.  Skin Cancer  Check your skin from head to toe regularly.  Monitor any moles. Be sure to tell your health care provider: ? About any new moles or changes in moles, especially if there is a change in a mole's shape or color. ? If you have a mole that is larger than the size of a pencil eraser.  If any of your family members has a history of skin cancer, especially at a Tyeler Goedken age, talk with your health care provider about genetic screening.  Always use sunscreen. Apply sunscreen liberally and repeatedly throughout the day.  Whenever you are outside, protect yourself by wearing long sleeves, pants, a wide-brimmed hat, and sunglasses.  What should I know about osteoporosis? Osteoporosis is a condition in which bone destruction happens more quickly than new bone creation. After menopause, you may be at an increased risk for osteoporosis. To help prevent osteoporosis or the bone fractures that can happen because of osteoporosis, the following is recommended:  If you are 46-71 years old, get at least 1,000 mg of calcium and at least 600 mg of vitamin D per day.  If you are older than age 55 but younger than age 65, get at least 1,200 mg of calcium and at least 600 mg of vitamin D per day.  If you are older than age 54, get at least 1,200 mg of calcium and at least 800 mg of vitamin D per day.  Smoking and excessive alcohol intake increase the risk of osteoporosis. Eat foods that are rich in calcium and vitamin D, and do weight-bearing exercises several times each week as directed by your health care provider. What should I know about how menopause affects my mental health? Depression may occur at any age, but it is more common as you become older. Common symptoms of depression  include:  Low or sad mood.  Changes in sleep patterns.  Changes in appetite or eating patterns.  Feeling an overall lack of motivation or enjoyment of activities that you previously enjoyed.  Frequent crying spells.  Talk with your health care provider if you think that you are experiencing depression. What should I know about immunizations? It is important that you get and maintain your immunizations. These include:  Tetanus, diphtheria, and pertussis (Tdap) booster vaccine.  Influenza every year before the flu season begins.  Pneumonia vaccine.  Shingles vaccine.  Your health care provider may also recommend other immunizations. This information is not intended to replace advice given to you by your health care provider. Make sure you discuss any questions you have with your health care provider. Document Released: 12/04/2005  Document Revised: 05/01/2016 Document Reviewed: 07/16/2015 Elsevier Interactive Patient Education  2018 [employer]  

## 2018-09-05 NOTE — Progress Notes (Signed)
[name] 23-Jul-1966 [id number]    History:    Presents for annual exam.  2013 TVH for dysmenorrhea and menorrhagia.  Normal Pap and mammogram history.  09/2017  normal colonoscopy..  Diagnosed with diabetes 2 years ago is now on insulin and doing much better.  History of hypothyroidism is no longer on medication.  Weight is down 12 pounds from last year.  Past medical history, past surgical history, family history and social history were all reviewed and documented in the EPIC chart.  4 children  2 out of college and 2 in college.  Mother lung cancer, father esophageal cancer.  ROS:  A ROS was performed and pertinent positives and negatives are included.  Exam:  Vitals:   09/05/18 1610  BP: 128/90  Weight: 214 lb (97.1 kg)  Height: 5' 7.5" (1.715 m)   Body mass index is 33.02 kg/m.   General appearance:  Normal Thyroid:  Symmetrical, normal in size, without palpable masses or nodularity. Respiratory  Auscultation:  Clear without wheezing or rhonchi Cardiovascular  Auscultation:  Regular rate, without rubs, murmurs or gallops  Edema/varicosities:  Not grossly evident Abdominal  Soft,nontender, without masses, guarding or rebound.  Liver/spleen:  No organomegaly noted  Hernia:  None appreciated  Skin  Inspection:  Grossly normal   Breasts: Examined lying and sitting.     Right: Without masses, retractions, discharge or axillary adenopathy.     Left: Without masses, retractions, discharge or axillary adenopathy. Gentitourinary   Inguinal/mons:  Normal without inguinal adenopathy  External genitalia:  Normal  BUS/Urethra/Skene's glands:  Normal  Vagina:  Normal  Cervix: Uterus absent adnexa/parametria:     Rt: Without masses or tenderness.   Lt: Without masses or tenderness.  Anus and perineum: Normal  Digital rectal exam: Normal sphincter tone without palpated masses or tenderness  Assessment/Plan:  52 y.o. M BF G7 P4 for annual exam with no GYN  complaints.  2013 TVH on no HRT Diabetes-endocrinologist managing labs and meds on insulin Obesity   Plan: Encouraged to continue regular exercise, healthy diet and follow-up with nutritionist SBE's, continue annual screening mammogram, calcium rich foods, vitamin D 2000 daily encouraged.  Congratulated on the weight loss from last year, continue decreasing calorie/carbs for continued weight loss.  Huel Cote [facility], 5:07 PM 09/05/2018

## 2018-09-16 ENCOUNTER — Ambulatory Visit: Payer: 59 | Admitting: Dietician

## 2018-09-26 ENCOUNTER — Encounter: Payer: 59 | Attending: Endocrinology | Admitting: Dietician

## 2018-09-26 ENCOUNTER — Encounter: Payer: Self-pay | Admitting: Dietician

## 2018-09-26 DIAGNOSIS — E1100 Type 2 diabetes mellitus with hyperosmolarity without nonketotic hyperglycemic-hyperosmolar coma (NKHHC): Secondary | ICD-10-CM | POA: Diagnosis not present

## 2018-09-26 DIAGNOSIS — Z713 Dietary counseling and surveillance: Secondary | ICD-10-CM | POA: Insufficient documentation

## 2018-09-26 DIAGNOSIS — E118 Type 2 diabetes mellitus with unspecified complications: Secondary | ICD-10-CM

## 2018-09-26 NOTE — Patient Instructions (Signed)
Regular meal schedule. Continue your great changes  Avoiding sweetened drinks.  Eating more vegetables  Exercise habits Mindfulness  Eat slowly  Stop when satisfied

## 2018-09-26 NOTE — Progress Notes (Signed)
Diabetes Self-Management Education  Visit Type:  Follow-up  Appt. Start Time: 1630 Appt. End Time: 1700  09/27/2018  [name], identified by name and date of birth, is a 52 y.o. female with a diagnosis of Diabetes:  2.   Consitipation increased since increasing her vegetable intake. Fasting blood sugar now 80-130. Her appetite is down and she has problems having regular meal schedule at times. Medications include 50 units Lantus q am She did not tolerate Metformin due to increased back pain in the past. She is rotating the sites and has not noticeable skin issues on exam. She has ben walking or going to the [facility] for 1 hour 5 days per week. She reports increase energy since her blood sugar is better controlled.    Lives with her husband and 2 sons.  She does the shopping and cooking.  She is not working.  Used to run half [place].  Has taught swimming in the past.  Has had double knee replacements and exercise is challenging at times.  Her husband is supportive and walks with her many mornings when he gets off work.  ASSESSMENT   Weight 219 lb (99.3 kg), last menstrual period 01/03/2012. Body mass index is 33.79 kg/m.   Diabetes Self-Management Education - 09/27/18 0951      Psychosocial Assessment   Patient Belief/Attitude about Diabetes  Motivated to manage diabetes    Self-care barriers  None    Patient Concerns  Nutrition/Meal planning;Glycemic Control;Weight Control    Special Needs  None    Preferred Learning Style  No preference indicated    Learning Readiness  Ready      Pre-Education Assessment   Patient understands the diabetes disease and treatment process.  Demonstrates understanding / competency    Patient understands incorporating nutritional management into lifestyle.  Needs Review    Patient undertands incorporating physical activity into lifestyle.  Demonstrates understanding / competency    Patient understands using medications safely.   Demonstrates understanding / competency    Patient understands monitoring blood glucose, interpreting and using results  Demonstrates understanding / competency    Patient understands prevention, detection, and treatment of acute complications.  Demonstrates understanding / competency    Patient understands prevention, detection, and treatment of chronic complications.  Demonstrates understanding / competency    Patient understands how to develop strategies to address psychosocial issues.  Demonstrates understanding / competency    Patient understands how to develop strategies to promote health/change behavior.  Needs Review      Complications   How often do you check your blood sugar?  1-2 times/day    Fasting Blood glucose range (mg/dL)  70-129      Dietary Intake   Breakfast  2 boiled eggs, 1 cup 1% milk OR protein shake    Snack (morning)  none    Lunch  --   sometimes delays and then feels bad   Snack (afternoon)  nuts    Dinner  skinless chicken breast, broccoli, pees    Beverage(s)  water, diet green tea- no longer drinking regular soda      Exercise   Exercise Type  Light (walking / raking leaves)    How many days per week to you exercise?  5    How many minutes per day do you exercise?  60    Total minutes per week of exercise  300      Patient Education   Previous Diabetes Education  Yes (please comment)  6 weeks ago   Nutrition management   Meal options for control of blood glucose level and chronic complications.;Meal timing in regards to the patients' current diabetes medication.    Medications  Reviewed patients medication for diabetes, action, purpose, timing of dose and side effects.      Individualized Goals (developed by patient)   Nutrition  General guidelines for healthy choices and portions discussed    Physical Activity  Exercise 5-7 days per week;60 minutes per day    Medications  take my medication as prescribed    Monitoring   test my blood glucose as  discussed    Reducing Risk  Other (comment)   consistent meal schedule, healthy fats, in small amounts     Patient Self-Evaluation of Goals - Patient rates self as meeting previously set goals (% of time)   Nutrition  >75%    Physical Activity  >75%    Medications  >75%    Monitoring  >75%    Problem Solving  >75%    Reducing Risk  >75%    Health Coping  >75%      Post-Education Assessment   Patient understands the diabetes disease and treatment process.  Demonstrates understanding / competency    Patient understands incorporating nutritional management into lifestyle.  Demonstrates understanding / competency    Patient undertands incorporating physical activity into lifestyle.  Demonstrates understanding / competency    Patient understands using medications safely.  Demonstrates understanding / competency    Patient understands monitoring blood glucose, interpreting and using results  Demonstrates understanding / competency    Patient understands prevention, detection, and treatment of acute complications.  Demonstrates understanding / competency    Patient understands prevention, detection, and treatment of chronic complications.  Demonstrates understanding / competency    Patient understands how to develop strategies to address psychosocial issues.  Demonstrates understanding / competency    Patient understands how to develop strategies to promote health/change behavior.  Demonstrates understanding / competency      Outcomes   Program Status  Completed      Subsequent Visit   Since your last visit have you continued or begun to take your medications as prescribed?  Yes    Since your last visit have you experienced any weight changes?  Gain    Weight Gain (lbs)  7       Learning Objective:  Patient will have a greater understanding of diabetes self-management. Patient education plan is to attend individual and/or group sessions per assessed needs and concerns.   Plan:    Patient Instructions  Regular meal schedule. Continue your great changes  Avoiding sweetened drinks.  Eating more vegetables  Exercise habits Mindfulness  Eat slowly  Stop when satisfied    Expected Outcomes:  Demonstrated interest in learning. Expect positive outcomes  Education material provided:   If problems or questions, patient to contact team via:  Phone  Future DSME appointment: - 6 months

## 2018-10-06 ENCOUNTER — Other Ambulatory Visit: Payer: Self-pay | Admitting: Women's Health

## 2018-10-06 DIAGNOSIS — Z1231 Encounter for screening mammogram for malignant neoplasm of breast: Secondary | ICD-10-CM

## 2018-10-07 ENCOUNTER — Ambulatory Visit: Payer: 59 | Admitting: Endocrinology

## 2018-10-31 ENCOUNTER — Ambulatory Visit: Payer: 59 | Admitting: Endocrinology

## 2018-11-08 ENCOUNTER — Ambulatory Visit: Payer: 59 | Admitting: Endocrinology

## 2018-11-08 ENCOUNTER — Encounter: Payer: Self-pay | Admitting: Endocrinology

## 2018-11-08 VITALS — BP 110/70 | HR 69 | Ht 67.5 in | Wt 222.4 lb

## 2018-11-08 DIAGNOSIS — Z794 Long term (current) use of insulin: Secondary | ICD-10-CM

## 2018-11-08 DIAGNOSIS — E118 Type 2 diabetes mellitus with unspecified complications: Secondary | ICD-10-CM | POA: Diagnosis not present

## 2018-11-08 DIAGNOSIS — E119 Type 2 diabetes mellitus without complications: Secondary | ICD-10-CM

## 2018-11-08 LAB — POCT GLYCOSYLATED HEMOGLOBIN (HGB A1C): HEMOGLOBIN A1C: 6.6 % — AB (ref 4.0–5.6)

## 2018-11-08 MED ORDER — GLUCOSE BLOOD VI STRP: 1.0000 | ORAL_STRIP | Freq: Two times a day (BID) | 3 refills | Status: DC

## 2018-11-08 MED ORDER — INSULIN GLARGINE 100 UNIT/ML SOLOSTAR PEN: 45.0000 [IU] | PEN_INJECTOR | SUBCUTANEOUS | 99 refills | Status: DC

## 2018-11-08 NOTE — Progress Notes (Signed)
Subjective:    Patient ID: [name], female    DOB: 1966-07-23, 53 y.o.   MRN: [id number]  HPI Pt returns for f/u of diabetes mellitus:  DM type: Insulin-requiring type 2.   Dx'ed: 6301 Complications: none Therapy: insulin since 2019 GDM: never DKA: never Severe hypoglycemia: never Pancreatitis: never Pancreatic imaging: never Other: due to h/o noncompliance, she is not a candidate for multiple daily injections; she did not tolerate metformin (back pain).  Interval history: no cbg record, but states cbg's vary from 63-165.  It is in general higher as the day goes on.  pt states she feels well in general.  She says diet and exercise are better recently.   Past Medical History:  Diagnosis Date  . Acute meniscal tear of left knee   . Diabetes mellitus without complication ([place])   . History of hypothyroidism   . Prediabetes 09/20/2017   Elevated blood glucose since 2013 possibly prior.    Past Surgical History:  Procedure Laterality Date  . [place] ENDOMETRIAL ABLATION  03-16-2007  . KNEE ARTHROSCOPY Right   . KNEE ARTHROSCOPY WITH MEDIAL MENISECTOMY Left 05/17/2015   Procedure: LEFT KNEE ARTHROSCOPY WITH MEDIAL MENISECTOMY, CHONDROPLASTY PATELLA;  Surgeon: Latanya Maudlin, MD;  Location: [facility];  Service: Orthopedics;  Laterality: Left;  . LAPAROSOCPY BILATERAL TUBAL LIGATION W/ CAUTERIZATION  10-15-2000  . VAGINAL HYSTERECTOMY  01/25/2012   Procedure: HYSTERECTOMY VAGINAL;  Surgeon: Anastasio Auerbach, MD;  Location: [facility];  Service: Gynecology;  Laterality: N/A;    Social History   Socioeconomic History  . Marital status: Married    Spouse name: Not on file  . Number of children: Not on file  . Years of education: Not on file  . Highest education level: Not on file  Occupational History  . Not on file  Social Needs  . Financial resource strain: Not on file  . Food insecurity:    Worry: Not on file    Inability: Not on  file  . Transportation needs:    Medical: Not on file    Non-medical: Not on file  Tobacco Use  . Smoking status: Never Smoker  . Smokeless tobacco: Never Used  Substance and Sexual Activity  . Alcohol use: No    Alcohol/week: 0.0 standard drinks  . Drug use: No  . Sexual activity: Not Currently    Comment: DES NEG,DECLINED INSURANCE QUESTIONS  Lifestyle  . Physical activity:    Days per week: Not on file    Minutes per session: Not on file  . Stress: Not on file  Relationships  . Social connections:    Talks on phone: Not on file    Gets together: Not on file    Attends religious service: Not on file    Active member of club or organization: Not on file    Attends meetings of clubs or organizations: Not on file    Relationship status: Not on file  . Intimate partner violence:    Fear of current or ex partner: Not on file    Emotionally abused: Not on file    Physically abused: Not on file    Forced sexual activity: Not on file  Other Topics Concern  . Not on file  Social History Narrative  . Not on file    Current Outpatient Medications on File Prior to Visit  Medication Sig Dispense Refill  . fluticasone (FLONASE) 50 MCG/ACT nasal spray Place 1 spray into both nostrils  daily. 16 g 2   No current facility-administered medications on file prior to visit.     Allergies  Allergen Reactions  . Adhesive [Tape] Rash    Family History  Problem Relation Age of Onset  . Hypertension Mother   . Cancer Mother        LUG, THROAT, & BACK  . Breast cancer Mother   . Cancer Father        THROAT  . Throat cancer Father   . Cancer Maternal Grandmother        KIDNEY  . Cancer Maternal Grandfather        COLON  . Colon cancer Maternal Grandfather 62  . Diabetes Daughter   . Stomach cancer Neg Hx     BP 110/70 (BP Location: Right Arm, Patient Position: Sitting, Cuff Size: Large)   Pulse 69   Ht 5' 7.5" (1.715 m)   Wt 222 lb 6.4 oz (100.9 kg)   LMP 01/03/2012    SpO2 98%   BMI 34.32 kg/m    Review of Systems She denies hypoglycemia.      Objective:   Physical Exam VITAL SIGNS:  See vs page GENERAL: no distress Pulses: dorsalis pedis intact bilat.   MSK: no deformity of the feet CV: no leg edema Skin:  no ulcer on the feet.  normal color and temp on the feet. Neuro: sensation is intact to touch on the feet  Lab Results  Component Value Date   HGBA1C 6.6 (A) 11/08/2018       Assessment & Plan:  Insulin-requiring type 2 DM: overcontrolled.  Hypoglycemia: this limits aggressiveness of glycemic control.    Patient Instructions  check your blood sugar twice a day.  vary the time of day when you check, between before the 3 meals, and at bedtime.  also check if you have symptoms of your blood sugar being too high or too low.  please keep a record of the readings and bring it to your next appointment here (or you can bring the meter itself).  You can write it on any piece of paper.  please call us sooner if your blood sugar goes below 70, or if you have a lot of readings over 200.   please decrease the lantus to 45 units of insulin each morning.    Please come back for a follow-up appointment in 3 months.

## 2018-11-08 NOTE — Patient Instructions (Addendum)
check your blood sugar twice a day.  vary the time of day when you check, between before the 3 meals, and at bedtime.  also check if you have symptoms of your blood sugar being too high or too low.  please keep a record of the readings and bring it to your next appointment here (or you can bring the meter itself).  You can write it on any piece of paper.  please call us sooner if your blood sugar goes below 70, or if you have a lot of readings over 200.   please decrease the lantus to 45 units of insulin each morning.    Please come back for a follow-up appointment in 3 months.

## 2018-11-15 ENCOUNTER — Other Ambulatory Visit: Payer: Self-pay

## 2018-11-15 ENCOUNTER — Telehealth: Payer: Self-pay | Admitting: Endocrinology

## 2018-11-15 DIAGNOSIS — E119 Type 2 diabetes mellitus without complications: Secondary | ICD-10-CM

## 2018-11-15 DIAGNOSIS — Z794 Long term (current) use of insulin: Principal | ICD-10-CM

## 2018-11-15 MED ORDER — INSULIN PEN NEEDLE 29G X 12.7MM MISC: 1.0000 | Freq: Every day | 1 refills | Status: DC

## 2018-11-15 MED ORDER — BASAGLAR KWIKPEN 100 UNIT/ML ~~LOC~~ SOPN: 45.0000 [IU] | PEN_INJECTOR | Freq: Every day | SUBCUTANEOUS | 3 refills | Status: DC

## 2018-11-15 NOTE — Telephone Encounter (Signed)
Either lantus or basaglar is fine with me.

## 2018-11-15 NOTE — Telephone Encounter (Signed)
Following PA completed 11/14/18. Response received today as indicated below:   Eula Listen Key: [id number]    Lantus SoloStar 100UNIT/ML pen-injectors has been rejected by insurance.  15% The chance that your patient will receive their medication if you attempt a prior authorization.  Your patient is more likely to start therapy on these medications for this plan.  Using [facility]'s published formulary we have determined that preferred agents for your patient are likely: Basaglar KwikPen Preferred  30%  Levemir Preferred  31%  Levemir FlexTouch Preferred  37%  Tyler Aas FlexTouch Preferred  46%  Download a PDF of this information to discuss alternative therapies with the prescriber.    Based on historical claims data, PA determinations, and patient behavior at the pharmacy.   Above information has been given to Dr. Loanne Drilling for review and direction.

## 2018-11-15 NOTE — Telephone Encounter (Signed)
Per Dr. Loanne Drilling, based on insurance coverage, change Rx from Lantus Solostar to [employer], same dosage. Rx sent as ordered. Called pt and made her aware. Advised to f/u with pharmacy re: status of pick up. Verbalized acceptance and understanding.

## 2018-11-15 NOTE — Telephone Encounter (Signed)
Patient stated that the pharmacy was going to reach out to our office about the patients insulin. That that the doctor needs to authorize this medication The patient is completely out of her insulin and would like to know what she should do   Please advise

## 2018-11-17 ENCOUNTER — Ambulatory Visit: Payer: 59

## 2018-12-09 ENCOUNTER — Ambulatory Visit: Payer: 59

## 2018-12-12 ENCOUNTER — Other Ambulatory Visit: Payer: Self-pay | Admitting: Women's Health

## 2018-12-12 ENCOUNTER — Ambulatory Visit
Admission: RE | Admit: 2018-12-12 | Discharge: 2018-12-12 | Disposition: A | Discharge status: Home / Self Care | Payer: 59 | Source: Ambulatory Visit | Attending: Women's Health | Admitting: Women's Health

## 2018-12-12 DIAGNOSIS — Z1231 Encounter for screening mammogram for malignant neoplasm of breast: Secondary | ICD-10-CM | POA: Diagnosis not present

## 2018-12-23 ENCOUNTER — Other Ambulatory Visit: Payer: Self-pay | Admitting: Endocrinology

## 2018-12-23 DIAGNOSIS — E118 Type 2 diabetes mellitus with unspecified complications: Secondary | ICD-10-CM

## 2019-01-19 ENCOUNTER — Telehealth: Payer: Self-pay | Admitting: Endocrinology

## 2019-01-19 NOTE — Telephone Encounter (Signed)
MEDICATION: test strips, lancets, lancet drum for Accu Check Fast Click  [facility]AC[facility] [address] [facility]  [facility]  IS PATIENT OUT OF MEDICATION: never had these  IF NOT; HOW MUCH IS LEFT:   LAST APPOINTMENT DATE: @2 /28/2020  NEXT APPOINTMENT DATE:@4 /14/2020  DO WE HAVE YOUR PERMISSION TO LEAVE A DETAILED MESSAGE: YES  OTHER COMMENTS:  received new peter but not supplies    **Let patient know to contact pharmacy at the end of the day to make sure medication is ready. **  ** Please notify patient to allow 48-72 hours to process**  **Encourage patient to contact the pharmacy for refills or they can request refills through [facility]**

## 2019-01-20 ENCOUNTER — Other Ambulatory Visit: Payer: Self-pay

## 2019-01-20 MED ORDER — ACCU-CHEK FASTCLIX LANCETS MISC: 3 refills | Status: DC

## 2019-01-20 MED ORDER — GLUCOSE BLOOD VI STRP: ORAL_STRIP | 3 refills | Status: DC

## 2019-01-20 MED ORDER — ACCU-CHEK FASTCLIX LANCET KIT: PACK | 0 refills | Status: DC

## 2019-01-20 NOTE — Telephone Encounter (Signed)
Patient stated that only the test strips where sent into the pharmacy. And she is requesting the Lancets and [employer].. Accu Check Fast Click      [facility] [address]

## 2019-01-20 NOTE — Telephone Encounter (Signed)
rx sent

## 2019-01-24 ENCOUNTER — Encounter: Payer: Self-pay | Admitting: Sports Medicine

## 2019-01-24 ENCOUNTER — Other Ambulatory Visit: Payer: Self-pay | Admitting: Sports Medicine

## 2019-01-24 ENCOUNTER — Ambulatory Visit (INDEPENDENT_AMBULATORY_CARE_PROVIDER_SITE_OTHER): Payer: 59

## 2019-01-24 ENCOUNTER — Other Ambulatory Visit: Payer: Self-pay

## 2019-01-24 ENCOUNTER — Ambulatory Visit: Payer: 59 | Admitting: Sports Medicine

## 2019-01-24 VITALS — BP 131/81 | HR 81 | Temp 98.0°F | Resp 16

## 2019-01-24 DIAGNOSIS — M722 Plantar fascial fibromatosis: Secondary | ICD-10-CM | POA: Diagnosis not present

## 2019-01-24 DIAGNOSIS — E1100 Type 2 diabetes mellitus with hyperosmolarity without nonketotic hyperglycemic-hyperosmolar coma (NKHHC): Secondary | ICD-10-CM | POA: Diagnosis not present

## 2019-01-24 DIAGNOSIS — M79672 Pain in left foot: Secondary | ICD-10-CM

## 2019-01-24 MED ORDER — MELOXICAM 15 MG PO TABS: 15.0000 mg | ORAL_TABLET | Freq: Every day | ORAL | 0 refills | Status: DC

## 2019-01-24 NOTE — Patient Instructions (Signed)

## 2019-01-24 NOTE — Progress Notes (Signed)
Subjective: [name] is a 53 y.o. Diabetic  female patient presents to office with complaint of moderate heel pain on the left. Patient admits to post static dyskinesia for 1 month in duration, 7/10, achy in nature. Patient has treated this problem with motrin with a little relief. Pain is worse with certain shoes and walking. Denies any other pedal complaints.   FBS 124 yesterday and last A1cx 6.5  Review of Systems  All other systems reviewed and are negative.    Patient Active Problem List   Diagnosis Date Noted  . Chronic midline low back pain without sciatica 02/13/2018  . Mixed diabetic hyperlipidemia associated with type 2 diabetes mellitus ([place]) 10/25/2017  . Hypertension associated with diabetes ([place]) 10/25/2017  . Diabetes mellitus, type II ([place]) 10/25/2017  . H/O total hysterectomy- benign reasons 10/18/17  . Left knee pain- Dr Amedeo Plenty 2017/10/18  . Perimenopausal October 18, 2017  . Family history of breast cancer in mother-deceased late 2023/01/25 10/18/2017  . Family history of colon cancer- MGF- in 60's 10-18-2017  . Blood glucose elevated 10-18-2017  . Elevated alanine aminotransferase (ALT) level 10/18/2017  . Low HDL (under 40) 2017-10-18  . Severe obesity (BMI 35.0-35.9 with comorbidity) ([place]) 18-Oct-2017  . Vitamin D deficiency 10/18/2017  . Family history of diabetes mellitus (DM)- in daughter Oct 18, 2017  . Hypothyroidism 02/08/2013    Current Outpatient Medications on File Prior to Visit  Medication Sig Dispense Refill  . Accu-Chek FastClix Lancets MISC Use fastlix lancets as instructed to check blood sugar twice daily. DX:E11.59 100 each 3  . Blood Glucose Monitoring Suppl (ACCU-CHEK GUIDE) w/Device KIT Please specify directions, refills and quantity 1 kit 0  . fluticasone (FLONASE) 50 MCG/ACT nasal spray Place 1 spray into both nostrils daily. 16 g 2  . glucose blood (ACCU-CHEK GUIDE) test strip Use accu chek guide test strips to check blood sugar twice  daily. DX:E11.59 100 each 3  . Insulin Glargine (BASAGLAR KWIKPEN) 100 UNIT/ML SOPN Inject 0.45 mLs (45 Units total) into the skin daily. 2 pen 3  . Insulin Pen Needle 29G X 12.7MM MISC 1 each by Does not apply route daily. 90 each 1  . Lancets Misc. (ACCU-CHEK FASTCLIX LANCET) KIT Use fastclix lancet kit to check blood sugar twice daily.DX:E11.59 1 kit 0   No current facility-administered medications on file prior to visit.     Allergies  Allergen Reactions  . Adhesive [Tape] Rash    Objective: Physical Exam General: The patient is alert and oriented x3 in no acute distress.  Dermatology: Skin is warm, dry and supple bilateral lower extremities. Nails 1-10 are normal. There is no erythema, edema, no eccymosis, no open lesions present. Integument is otherwise unremarkable.  Vascular: Dorsalis Pedis pulse and Posterior Tibial pulse are 2/4 bilateral. Capillary fill time is immediate to all digits.  Neurological: Grossly intact to light touch with an achilles reflex of +2/5 and a  negative Tinel's sign bilateral.  Musculoskeletal: Tenderness to palpation at the medial calcaneal tubercale and through the insertion of the plantar fascia on the left foot. No pain with compression of calcaneus bilateral. No pain with tuning fork to calcaneus bilateral. No pain with calf compression bilateral. There is decreased Ankle joint range of motion bilateral. All other joints range of motion within normal limits bilateral. + planus foot type. Strength 5/5 in all groups bilateral.   Gait: Unassisted, Antalgic avoid weight on left heel  Xray, Left foot:  Normal osseous mineralization. Joint spaces preserved. No fracture/dislocation/boney destruction.  Calcaneal spur present with mild thickening of plantar fascia. No other soft tissue abnormalities or radiopaque foreign bodies.   Assessment and Plan: Problem List Items Addressed This Visit      Endocrine   Diabetes mellitus, type II ([place]) (Chronic)     Other Visit Diagnoses    Plantar fasciitis, left    -  Primary   Pain of left heel         -Complete examination performed.  -Xrays reviewed -Discussed with patient in detail the condition of plantar fasciitis, how this occurs and general treatment options. Explained both conservative and surgical treatments.  -Patient declined steroid injection  -Rx Meloxicam -Recommended good supportive shoes and advised use of OTC insert. -Explained in detail the use of the heel lifts dispensed at today's visit  -Explained and dispensed to patient daily stretching exercises. -Recommend patient to ice affected area 1-2x daily. -Patient to return to office as needed for follow up or sooner if problems or questions arise.  Landis Martins, DPM

## 2019-02-07 ENCOUNTER — Ambulatory Visit: Payer: 59 | Admitting: Endocrinology

## 2019-02-16 ENCOUNTER — Other Ambulatory Visit: Payer: Self-pay | Admitting: Sports Medicine

## 2019-02-17 ENCOUNTER — Other Ambulatory Visit: Payer: Self-pay | Admitting: Endocrinology

## 2019-02-17 DIAGNOSIS — E119 Type 2 diabetes mellitus without complications: Secondary | ICD-10-CM

## 2019-02-17 DIAGNOSIS — Z794 Long term (current) use of insulin: Principal | ICD-10-CM

## 2019-03-09 ENCOUNTER — Ambulatory Visit: Payer: 59 | Admitting: Endocrinology

## 2019-03-10 ENCOUNTER — Other Ambulatory Visit: Payer: Self-pay

## 2019-03-14 ENCOUNTER — Ambulatory Visit: Payer: 59 | Admitting: Endocrinology

## 2019-03-14 ENCOUNTER — Encounter: Payer: Self-pay | Admitting: Endocrinology

## 2019-03-14 ENCOUNTER — Other Ambulatory Visit: Payer: Self-pay

## 2019-03-14 VITALS — BP 140/86 | HR 73 | Ht 67.5 in | Wt 231.2 lb

## 2019-03-14 DIAGNOSIS — E119 Type 2 diabetes mellitus without complications: Secondary | ICD-10-CM

## 2019-03-14 DIAGNOSIS — Z794 Long term (current) use of insulin: Secondary | ICD-10-CM

## 2019-03-14 LAB — POCT GLYCOSYLATED HEMOGLOBIN (HGB A1C): Hemoglobin A1C: 7.4 % — AB (ref 4.0–5.6)

## 2019-03-14 NOTE — Patient Instructions (Addendum)
check your blood sugar twice a day.  vary the time of day when you check, between before the 3 meals, and at bedtime.  also check if you have symptoms of your blood sugar being too high or too low.  please keep a record of the readings and bring it to your next appointment here (or you can bring the meter itself).  You can write it on any piece of paper.  please call us sooner if your blood sugar goes below 70, or if you have a lot of readings over 200.   Please continue the same insulin.  Your blood pressure is slightly high today.  Please see a primary care provider soon, to have it rechecked.   Please come back for a follow-up appointment in 3 months.

## 2019-03-14 NOTE — Progress Notes (Signed)
Subjective:    Patient ID: [name], female    DOB: 1966-05-15, 53 y.o.   MRN: [id number]  HPI Pt returns for f/u of diabetes mellitus:  DM type: Insulin-requiring type 2.   Dx'ed: 3276 Complications: none Therapy: insulin since 2019 GDM: never DKA: never Severe hypoglycemia: never Pancreatitis: never Pancreatic imaging: never Other: due to h/o noncompliance, she is not a candidate for multiple daily injections; she did not tolerate metformin (back pain).   Interval history: no cbg record, but states cbg's vary from 63-165.  It is in general higher as the day goes on.  pt states she feels well in general.  She says diet and exercise are better recently.   Past Medical History:  Diagnosis Date  . Acute meniscal tear of left knee   . Diabetes mellitus without complication ([place])   . History of hypothyroidism   . Prediabetes 09/20/2017   Elevated blood glucose since 2013 possibly prior.    Past Surgical History:  Procedure Laterality Date  . [place] ENDOMETRIAL ABLATION  03-16-2007  . KNEE ARTHROSCOPY Right   . KNEE ARTHROSCOPY WITH MEDIAL MENISECTOMY Left 05/17/2015   Procedure: LEFT KNEE ARTHROSCOPY WITH MEDIAL MENISECTOMY, CHONDROPLASTY PATELLA;  Surgeon: Latanya Maudlin, MD;  Location: [facility];  Service: Orthopedics;  Laterality: Left;  . LAPAROSOCPY BILATERAL TUBAL LIGATION W/ CAUTERIZATION  10-15-2000  . VAGINAL HYSTERECTOMY  01/25/2012   Procedure: HYSTERECTOMY VAGINAL;  Surgeon: Anastasio Auerbach, MD;  Location: [facility];  Service: Gynecology;  Laterality: N/A;    Social History   Socioeconomic History  . Marital status: Married    Spouse name: Not on file  . Number of children: Not on file  . Years of education: Not on file  . Highest education level: Not on file  Occupational History  . Not on file  Social Needs  . Financial resource strain: Not on file  . Food insecurity:    Worry: Not on file    Inability: Not on  file  . Transportation needs:    Medical: Not on file    Non-medical: Not on file  Tobacco Use  . Smoking status: Never Smoker  . Smokeless tobacco: Never Used  Substance and Sexual Activity  . Alcohol use: No    Alcohol/week: 0.0 standard drinks  . Drug use: No  . Sexual activity: Not Currently    Comment: DES NEG,DECLINED INSURANCE QUESTIONS  Lifestyle  . Physical activity:    Days per week: Not on file    Minutes per session: Not on file  . Stress: Not on file  Relationships  . Social connections:    Talks on phone: Not on file    Gets together: Not on file    Attends religious service: Not on file    Active member of club or organization: Not on file    Attends meetings of clubs or organizations: Not on file    Relationship status: Not on file  . Intimate partner violence:    Fear of current or ex partner: Not on file    Emotionally abused: Not on file    Physically abused: Not on file    Forced sexual activity: Not on file  Other Topics Concern  . Not on file  Social History Narrative  . Not on file    Current Outpatient Medications on File Prior to Visit  Medication Sig Dispense Refill  . Accu-Chek FastClix Lancets MISC Use fastlix lancets as instructed to check  blood sugar twice daily. DX:E11.59 100 each 3  . Blood Glucose Monitoring Suppl (ACCU-CHEK GUIDE) w/Device KIT Please specify directions, refills and quantity 1 kit 0  . fluticasone (FLONASE) 50 MCG/ACT nasal spray Place 1 spray into both nostrils daily. 16 g 2  . glucose blood (ACCU-CHEK GUIDE) test strip Use accu chek guide test strips to check blood sugar twice daily. DX:E11.59 100 each 3  . Insulin Glargine (BASAGLAR KWIKPEN) 100 UNIT/ML SOPN INJECT 45 UNITS INTO THE SKIN DAILY. 6 pen 3  . Insulin Pen Needle 29G X 12.7MM MISC 1 each by Does not apply route daily. 90 each 1  . Lancets Misc. (ACCU-CHEK FASTCLIX LANCET) KIT Use fastclix lancet kit to check blood sugar twice daily.DX:E11.59 1 kit 0  .  meloxicam (MOBIC) 15 MG tablet TAKE 1 TABLET BY MOUTH EVERY DAY 30 tablet 0   No current facility-administered medications on file prior to visit.     Allergies  Allergen Reactions  . Adhesive [Tape] Rash    Family History  Problem Relation Age of Onset  . Hypertension Mother   . Cancer Mother        LUG, THROAT, & BACK  . Breast cancer Mother   . Cancer Father        THROAT  . Throat cancer Father   . Cancer Maternal Grandmother        KIDNEY  . Cancer Maternal Grandfather        COLON  . Colon cancer Maternal Grandfather 55  . Diabetes Daughter   . Stomach cancer Neg Hx     BP 140/86 (BP Location: Left Arm, Patient Position: Sitting, Cuff Size: Large)   Pulse 73   Ht 5' 7.5" (1.715 m)   Wt 231 lb 3.2 oz (104.9 kg)   LMP 01/03/2012   SpO2 96%   BMI 35.68 kg/m   Review of Systems Denies LOC    Objective:   Physical Exam VITAL SIGNS:  See vs page GENERAL: no distress Pulses: dorsalis pedis intact bilat.   MSK: no deformity of the feet CV: no leg edema Skin:  no ulcer on the feet.  normal color and temp on the feet.  Neuro: sensation is intact to touch on the feet.   Lab Results  Component Value Date   HGBA1C 7.4 (A) 03/14/2019       Assessment & Plan:  Insulin-requiring type 2 DM: this is the best control this pt should aim for, given this regimen, which does match insulin to her changing needs throughout the day Hypothyroidism: This limits aggressiveness of glycemic control HTN: is noted today  Patient Instructions  check your blood sugar twice a day.  vary the time of day when you check, between before the 3 meals, and at bedtime.  also check if you have symptoms of your blood sugar being too high or too low.  please keep a record of the readings and bring it to your next appointment here (or you can bring the meter itself).  You can write it on any piece of paper.  please call us sooner if your blood sugar goes below 70, or if you have a lot of readings  over 200.   Please continue the same insulin.  Your blood pressure is slightly high today.  Please see a primary care provider soon, to have it rechecked.   Please come back for a follow-up appointment in 3 months.

## 2019-03-16 ENCOUNTER — Other Ambulatory Visit: Payer: Self-pay | Admitting: Sports Medicine

## 2019-03-30 ENCOUNTER — Ambulatory Visit: Payer: 59 | Admitting: Dietician

## 2019-04-23 ENCOUNTER — Other Ambulatory Visit: Payer: Self-pay | Admitting: Sports Medicine

## 2019-04-25 NOTE — Telephone Encounter (Signed)
Please advise 

## 2019-07-11 ENCOUNTER — Ambulatory Visit: Payer: 59 | Admitting: Endocrinology

## 2019-08-24 ENCOUNTER — Other Ambulatory Visit: Payer: Self-pay | Admitting: Endocrinology

## 2019-08-24 DIAGNOSIS — E119 Type 2 diabetes mellitus without complications: Secondary | ICD-10-CM

## 2019-08-24 DIAGNOSIS — Z794 Long term (current) use of insulin: Secondary | ICD-10-CM

## 2019-08-24 NOTE — Telephone Encounter (Signed)
Called pt and gave her MD message. Pt stated that was fine with her and she made a f/u visit for next week. Pt was reminded to bring glucometer to visit with her.

## 2019-08-24 NOTE — Telephone Encounter (Signed)
Please advise 

## 2019-08-24 NOTE — Telephone Encounter (Signed)
1.  Please schedule f/u appt 2.  Then please refill x 1, pending that appt.  

## 2019-08-28 ENCOUNTER — Encounter: Payer: Self-pay | Admitting: Endocrinology

## 2019-08-28 ENCOUNTER — Ambulatory Visit: Payer: 59 | Admitting: Endocrinology

## 2019-08-28 ENCOUNTER — Other Ambulatory Visit: Payer: Self-pay

## 2019-08-28 ENCOUNTER — Other Ambulatory Visit: Payer: Self-pay | Admitting: Endocrinology

## 2019-08-28 VITALS — BP 122/84 | HR 104 | Ht 67.5 in | Wt 224.4 lb

## 2019-08-28 DIAGNOSIS — Z794 Long term (current) use of insulin: Secondary | ICD-10-CM

## 2019-08-28 DIAGNOSIS — E119 Type 2 diabetes mellitus without complications: Secondary | ICD-10-CM

## 2019-08-28 LAB — BASIC METABOLIC PANEL
BUN: 16 mg/dL (ref 6–23)
CO2: 28 mEq/L (ref 19–32)
Calcium: 9.6 mg/dL (ref 8.4–10.5)
Chloride: 99 mEq/L (ref 96–112)
Creatinine, Ser: 0.9 mg/dL (ref 0.40–1.20)
GFR: 79.38 mL/min (ref >60.00)
Glucose, Bld: 204 mg/dL — ABNORMAL HIGH (ref 70–99)
Potassium: 3.8 mEq/L (ref 3.5–5.1)
Sodium: 135 mEq/L (ref 135–145)

## 2019-08-28 LAB — POCT GLYCOSYLATED HEMOGLOBIN (HGB A1C): Hemoglobin A1C: 7.7 % — AB (ref 4.0–5.6)

## 2019-08-28 LAB — TSH: TSH: 2.68 u[IU]/mL (ref 0.35–4.50)

## 2019-08-28 NOTE — Progress Notes (Signed)
Subjective:    Patient ID: [name], female    DOB: 11/12/1965, 53 y.o.   MRN: [id number]  HPI Pt returns for f/u of diabetes mellitus:  DM type: Insulin-requiring type 2.   Dx'ed: 7829 Complications: none Therapy: insulin since 2019 GDM: never DKA: never Severe hypoglycemia: never Pancreatitis: never Pancreatic imaging: never Other: due to h/o noncompliance, she is not a candidate for multiple daily injections; she did not tolerate metformin (back pain).   Interval history: no cbg record, but states cbg's vary from 130-263.  There is no trend throughout the day.  pt states she feels well in general.   Past Medical History:  Diagnosis Date  . Acute meniscal tear of left knee   . Diabetes mellitus without complication ([place])   . History of hypothyroidism   . Prediabetes 09/20/2017   Elevated blood glucose since 2013 possibly prior.    Past Surgical History:  Procedure Laterality Date  . [place] ENDOMETRIAL ABLATION  03-16-2007  . KNEE ARTHROSCOPY Right   . KNEE ARTHROSCOPY WITH MEDIAL MENISECTOMY Left 05/17/2015   Procedure: LEFT KNEE ARTHROSCOPY WITH MEDIAL MENISECTOMY, CHONDROPLASTY PATELLA;  Surgeon: Latanya Maudlin, MD;  Location: [facility];  Service: Orthopedics;  Laterality: Left;  . LAPAROSOCPY BILATERAL TUBAL LIGATION W/ CAUTERIZATION  10-15-2000  . VAGINAL HYSTERECTOMY  01/25/2012   Procedure: HYSTERECTOMY VAGINAL;  Surgeon: Anastasio Auerbach, MD;  Location: [facility];  Service: Gynecology;  Laterality: N/A;    Social History   Socioeconomic History  . Marital status: Married    Spouse name: Not on file  . Number of children: Not on file  . Years of education: Not on file  . Highest education level: Not on file  Occupational History  . Not on file  Social Needs  . Financial resource strain: Not on file  . Food insecurity    Worry: Not on file    Inability: Not on file  . Transportation needs    Medical: Not on file     Non-medical: Not on file  Tobacco Use  . Smoking status: Never Smoker  . Smokeless tobacco: Never Used  Substance and Sexual Activity  . Alcohol use: No    Alcohol/week: 0.0 standard drinks  . Drug use: No  . Sexual activity: Not Currently    Comment: DES NEG,DECLINED INSURANCE QUESTIONS  Lifestyle  . Physical activity    Days per week: Not on file    Minutes per session: Not on file  . Stress: Not on file  Relationships  . Social Herbalist on phone: Not on file    Gets together: Not on file    Attends religious service: Not on file    Active member of club or organization: Not on file    Attends meetings of clubs or organizations: Not on file    Relationship status: Not on file  . Intimate partner violence    Fear of current or ex partner: Not on file    Emotionally abused: Not on file    Physically abused: Not on file    Forced sexual activity: Not on file  Other Topics Concern  . Not on file  Social History Narrative  . Not on file    Current Outpatient Medications on File Prior to Visit  Medication Sig Dispense Refill  . Accu-Chek FastClix Lancets MISC Use fastlix lancets as instructed to check blood sugar twice daily. DX:E11.59 100 each 3  . Blood Glucose  Monitoring Suppl (ACCU-CHEK GUIDE) w/Device KIT Please specify directions, refills and quantity 1 kit 0  . fluticasone (FLONASE) 50 MCG/ACT nasal spray Place 1 spray into both nostrils daily. 16 g 2  . glucose blood (ACCU-CHEK GUIDE) test strip Use accu chek guide test strips to check blood sugar twice daily. DX:E11.59 100 each 3  . Insulin Glargine (BASAGLAR KWIKPEN) 100 UNIT/ML SOPN INJECT 45 UNITS INTO THE SKIN DAILY. 15 mL 0  . meloxicam (MOBIC) 15 MG tablet TAKE 1 TABLET BY MOUTH EVERY DAY 30 tablet 0   No current facility-administered medications on file prior to visit.     Allergies  Allergen Reactions  . Adhesive [Tape] Rash    Family History  Problem Relation Age of Onset  .  Hypertension Mother   . Cancer Mother        LUG, THROAT, & BACK  . Breast cancer Mother   . Cancer Father        THROAT  . Throat cancer Father   . Cancer Maternal Grandmother        KIDNEY  . Cancer Maternal Grandfather        COLON  . Colon cancer Maternal Grandfather 79  . Diabetes Daughter   . Stomach cancer Neg Hx     BP 122/84 (BP Location: Left Arm, Patient Position: Sitting, Cuff Size: Large)   Pulse (!) 104   Ht 5' 7.5" (1.715 m)   Wt 224 lb 6.4 oz (101.8 kg)   LMP 01/03/2012   SpO2 98%   BMI 34.63 kg/m    Review of Systems She denies hypoglycemia.      Objective:   Physical Exam VITAL SIGNS:  See vs page GENERAL: no distress Pulses: dorsalis pedis intact bilat.   MSK: no deformity of the feet CV: no leg edema Skin:  no ulcer on the feet.  normal color and temp on the feet. Neuro: sensation is intact to touch on the feet.    Lab Results  Component Value Date   HGBA1C 7.7 (A) 08/28/2019       Assessment & Plan:  Insulin-requiring type 2 DM: this is the best control this pt should aim for, given this regimen, which does match insulin to her changing needs throughout the day.  Tachycardia, new: check TFT    Patient Instructions  check your blood sugar twice a day.  vary the time of day when you check, between before the 3 meals, and at bedtime.  also check if you have symptoms of your blood sugar being too high or too low.  please keep a record of the readings and bring it to your next appointment here (or you can bring the meter itself).  You can write it on any piece of paper.  please call us sooner if your blood sugar goes below 70, or if you have a lot of readings over 200.   Please continue the same insulin.   Blood tests are requested for you today.  We'll let you know about the results.   Please come back for a follow-up appointment in 4 months.

## 2019-08-28 NOTE — Patient Instructions (Addendum)
check your blood sugar twice a day.  vary the time of day when you check, between before the 3 meals, and at bedtime.  also check if you have symptoms of your blood sugar being too high or too low.  please keep a record of the readings and bring it to your next appointment here (or you can bring the meter itself).  You can write it on any piece of paper.  please call us sooner if your blood sugar goes below 70, or if you have a lot of readings over 200.   Please continue the same insulin.   Blood tests are requested for you today.  We'll let you know about the results.   Please come back for a follow-up appointment in 4 months.

## 2019-09-04 ENCOUNTER — Other Ambulatory Visit: Payer: Self-pay | Admitting: Endocrinology

## 2019-09-08 ENCOUNTER — Other Ambulatory Visit: Payer: Self-pay

## 2019-09-11 ENCOUNTER — Other Ambulatory Visit: Payer: Self-pay

## 2019-09-11 ENCOUNTER — Ambulatory Visit (INDEPENDENT_AMBULATORY_CARE_PROVIDER_SITE_OTHER): Payer: 59 | Admitting: Women's Health

## 2019-09-11 ENCOUNTER — Encounter: Payer: Self-pay | Admitting: Women's Health

## 2019-09-11 VITALS — BP 120/82 | Ht 67.0 in | Wt 222.0 lb

## 2019-09-11 DIAGNOSIS — Z01419 Encounter for gynecological examination (general) (routine) without abnormal findings: Secondary | ICD-10-CM

## 2019-09-11 NOTE — Patient Instructions (Addendum)
Vit D 2000 iu daily Good to see you!  Health Maintenance for Postmenopausal Women Menopause is a normal process in which your ability to get pregnant comes to an end. This process happens slowly over many months or years, usually between the ages of 74 and 70. Menopause is complete when you have missed your menstrual periods for 12 months. It is important to talk with your health care provider about some of the most common conditions that affect women after menopause (postmenopausal women). These include heart disease, cancer, and bone loss (osteoporosis). Adopting a healthy lifestyle and getting preventive care can help to promote your health and wellness. The actions you take can also lower your chances of developing some of these common conditions. What should I know about menopause? During menopause, you may get a number of symptoms, such as:  Hot flashes. These can be moderate or severe.  Night sweats.  Decrease in sex drive.  Mood swings.  Headaches.  Tiredness.  Irritability.  Memory problems.  Insomnia. Choosing to treat or not to treat these symptoms is a decision that you make with your health care provider. Do I need hormone replacement therapy?  Hormone replacement therapy is effective in treating symptoms that are caused by menopause, such as hot flashes and night sweats.  Hormone replacement carries certain risks, especially as you become older. If you are thinking about using estrogen or estrogen with progestin, discuss the benefits and risks with your health care provider. What is my risk for heart disease and stroke? The risk of heart disease, heart attack, and stroke increases as you age. One of the causes may be a change in the body's hormones during menopause. This can affect how your body uses dietary fats, triglycerides, and cholesterol. Heart attack and stroke are medical emergencies. There are many things that you can do to help prevent heart disease and  stroke. Watch your blood pressure  High blood pressure causes heart disease and increases the risk of stroke. This is more likely to develop in people who have high blood pressure readings, are of African descent, or are overweight.  Have your blood pressure checked: ? Every 3-5 years if you are 76-53 years of age. ? Every year if you are 52 years old or older. Eat a healthy diet   Eat a diet that includes plenty of vegetables, fruits, low-fat dairy products, and lean protein.  Do not eat a lot of foods that are high in solid fats, added sugars, or sodium. Get regular exercise Get regular exercise. This is one of the most important things you can do for your health. Most adults should:  Try to exercise for at least 150 minutes each week. The exercise should increase your heart rate and make you sweat (moderate-intensity exercise).  Try to do strengthening exercises at least twice each week. Do these in addition to the moderate-intensity exercise.  Spend less time sitting. Even light physical activity can be beneficial. Other tips  Work with your health care provider to achieve or maintain a healthy weight.  Do not use any products that contain nicotine or tobacco, such as cigarettes, e-cigarettes, and chewing tobacco. If you need help quitting, ask your health care provider.  Know your numbers. Ask your health care provider to check your cholesterol and your blood sugar (glucose). Continue to have your blood tested as directed by your health care provider. Do I need screening for cancer? Depending on your health history and family history, you may need to  have cancer screening at different stages of your life. This may include screening for:  Breast cancer.  Cervical cancer.  Lung cancer.  Colorectal cancer. What is my risk for osteoporosis? After menopause, you may be at increased risk for osteoporosis. Osteoporosis is a condition in which bone destruction happens more  quickly than new bone creation. To help prevent osteoporosis or the bone fractures that can happen because of osteoporosis, you may take the following actions:  If you are 62-24 years old, get at least 1,000 mg of calcium and at least 600 mg of vitamin D per day.  If you are older than age 49 but younger than age 46, get at least 1,200 mg of calcium and at least 600 mg of vitamin D per day.  If you are older than age 40, get at least 1,200 mg of calcium and at least 800 mg of vitamin D per day. Smoking and drinking excessive alcohol increase the risk of osteoporosis. Eat foods that are rich in calcium and vitamin D, and do weight-bearing exercises several times each week as directed by your health care provider. How does menopause affect my mental health? Depression may occur at any age, but it is more common as you become older. Common symptoms of depression include:  Low or sad mood.  Changes in sleep patterns.  Changes in appetite or eating patterns.  Feeling an overall lack of motivation or enjoyment of activities that you previously enjoyed.  Frequent crying spells. Talk with your health care provider if you think that you are experiencing depression. General instructions See your health care provider for regular wellness exams and vaccines. This may include:  Scheduling regular health, dental, and eye exams.  Getting and maintaining your vaccines. These include: ? Influenza vaccine. Get this vaccine each year before the flu season begins. ? Pneumonia vaccine. ? Shingles vaccine. ? Tetanus, diphtheria, and pertussis (Tdap) booster vaccine. Your health care provider may also recommend other immunizations. Tell your health care provider if you have ever been abused or do not feel safe at home. Summary  Menopause is a normal process in which your ability to get pregnant comes to an end.  This condition causes hot flashes, night sweats, decreased interest in sex, mood swings,  headaches, or lack of sleep.  Treatment for this condition may include hormone replacement therapy.  Take actions to keep yourself healthy, including exercising regularly, eating a healthy diet, watching your weight, and checking your blood pressure and blood sugar levels.  Get screened for cancer and depression. Make sure that you are up to date with all your vaccines. This information is not intended to replace advice given to you by your health care provider. Make sure you discuss any questions you have with your health care provider. Document Released: 12/04/2005 Document Revised: 10/05/2018 Document Reviewed: 10/05/2018 Elsevier Patient Education  2020 [employer].

## 2019-09-11 NOTE — Progress Notes (Signed)
[name] 1966-07-29 [id number]    History:    Presents for annual exam.  2013 TVH for dysmenorrhea and menorrhagia.  Normal Pap and mammogram history.  2018 - colonoscopy.  Diabetes recently started on insulin and is doing much better hemoglobin A1c down to 7.  Had been 13 at one point.  Has had some elevated blood pressures on no medication at this time.  Past medical history, past surgical history, family history and social history were all reviewed and documented in the EPIC chart.  4 children all doing well working or going to school.  Stay-at-home mom.  Mother deceased lung cancer, father deceased esophageal cancer.  ROS:  A ROS was performed and pertinent positives and negatives are included.  Exam:  Vitals:   09/11/19 1207  BP: 120/82  Weight: 222 lb (100.7 kg)  Height: 5\' 7"  (1.702 m)   Body mass index is 34.77 kg/m.   General appearance:  Normal Thyroid:  Symmetrical, normal in size, without palpable masses or nodularity. Respiratory  Auscultation:  Clear without wheezing or rhonchi Cardiovascular  Auscultation:  Regular rate, without rubs, murmurs or gallops  Edema/varicosities:  Not grossly evident Abdominal  Soft,nontender, without masses, guarding or rebound.  Liver/spleen:  No organomegaly noted  Hernia:  None appreciated  Skin  Inspection:  Grossly normal   Breasts: Examined lying and sitting.     Right: Without masses, retractions, discharge or axillary adenopathy.     Left: Without masses, retractions, discharge or axillary adenopathy. Gentitourinary   Inguinal/mons:  Normal without inguinal adenopathy  External genitalia:  Normal  BUS/Urethra/Skene's glands:  Normal  Vagina:  Normal  Cervix: And uterus absent   Adnexa/parametria:     Rt: Without masses or tenderness.   Lt: Without masses or tenderness.  Anus and perineum: Normal  Digital rectal exam: Normal sphincter tone without palpated masses or tenderness  Assessment/Plan:  53 y.o. MBF  G7, P4 for annual exam with no complaints.  2013 TVH for menorrhagia and dysmenorrhea Diabetes on insulin-endocrinologist managing labs and meds Obesity  Plan: SBEs, annual screening mammogram, calcium rich foods, vitamin D 2000 daily encouraged.  Reviewed importance of weightbearing and balance type exercise.  Encouraged to continue decreasing calories/carbs, nutritionist as needed.  Pap screening guidelines reviewed.    [place], 12:29 PM 09/11/2019

## 2019-09-12 LAB — URINALYSIS, COMPLETE W/RFL CULTURE
Bacteria, UA: NONE SEEN /HPF
Bilirubin Urine: NEGATIVE
Glucose, UA: NEGATIVE
Hgb urine dipstick: NEGATIVE
Hyaline Cast: NONE SEEN /LPF
Ketones, ur: NEGATIVE
Leukocyte Esterase: NEGATIVE
Nitrites, Initial: NEGATIVE
Protein, ur: NEGATIVE
RBC / HPF: NONE SEEN /HPF (ref 0–2)
Specific Gravity, Urine: 1.023 (ref 1.001–1.03)
WBC, UA: NONE SEEN /HPF (ref 0–5)
pH: 5.5 (ref 5.0–8.0)

## 2019-09-12 LAB — NO CULTURE INDICATED

## 2019-09-12 IMAGING — US US ABDOMEN LIMITED
1 series · 14 of 25 positions shown · non-contrast
Comparison: None.

CLINICAL DATA: Elevated ALT.  Diabetes and obesity.

EXAM:
ULTRASOUND ABDOMEN LIMITED RIGHT UPPER QUADRANT

[Series 1: us abdomen limited · 0.23mm/px · 14 of 56 slices shown]
[im 1/56]
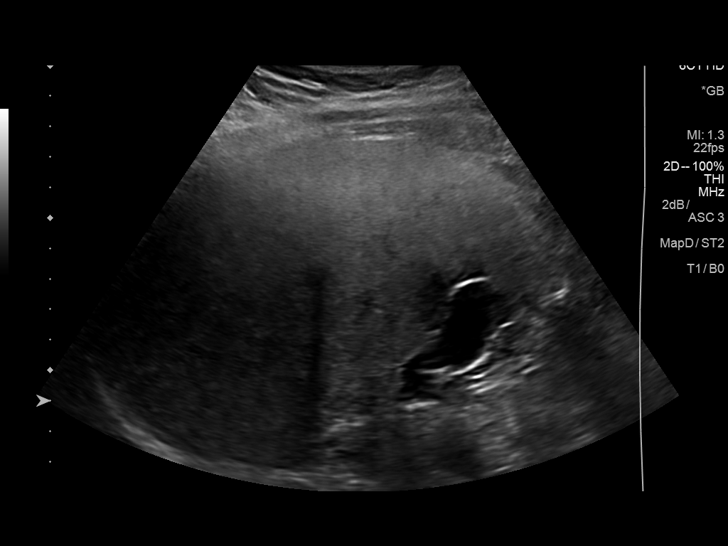
[im 5/56]
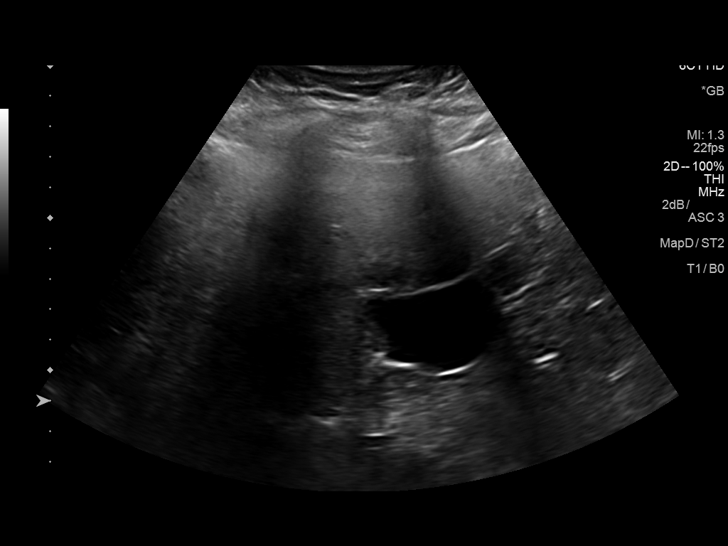
[im 10/56]
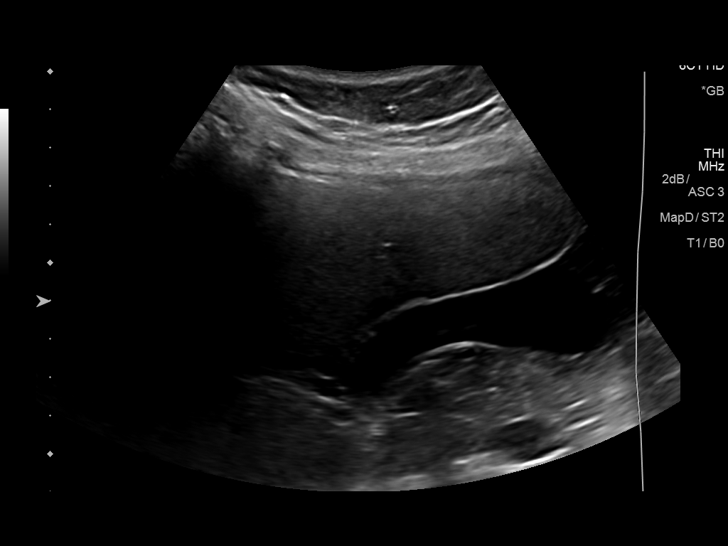
[im 14/56]
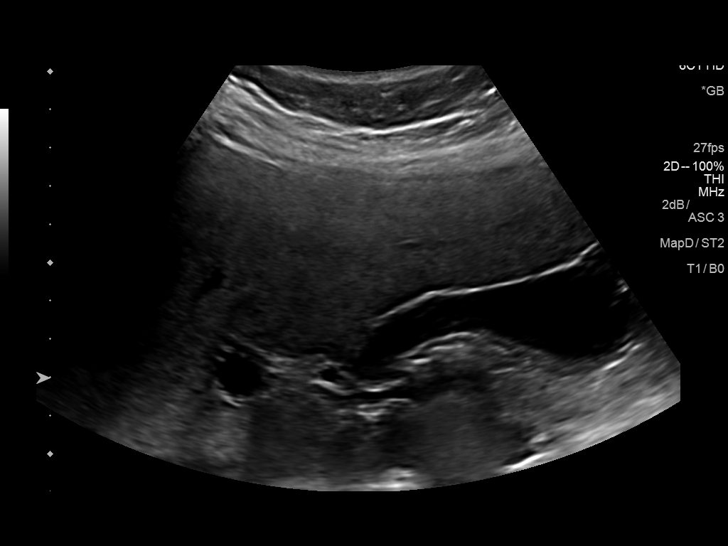
[im 19/56]
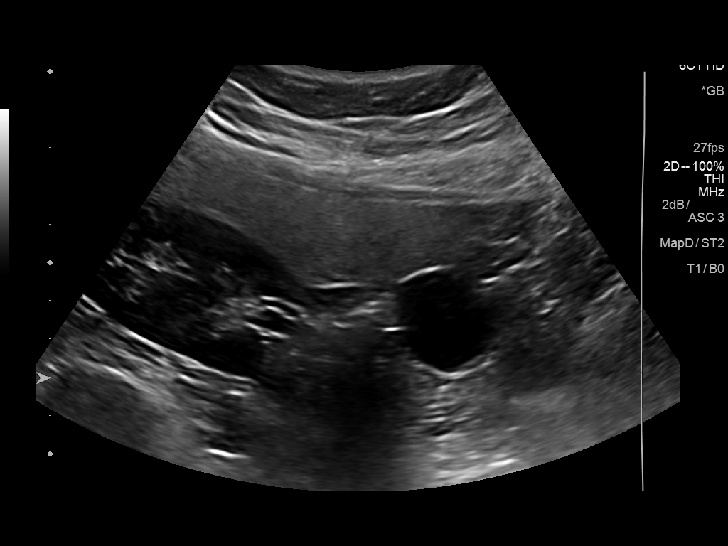
[im 21/56]
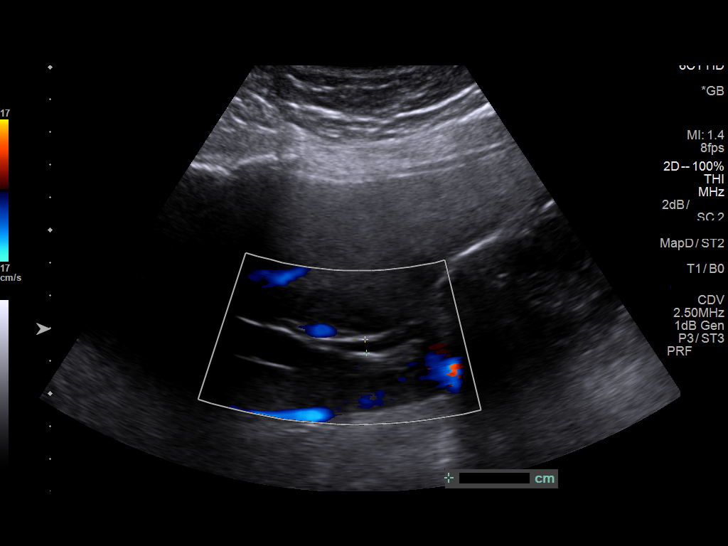
[im 26/56]
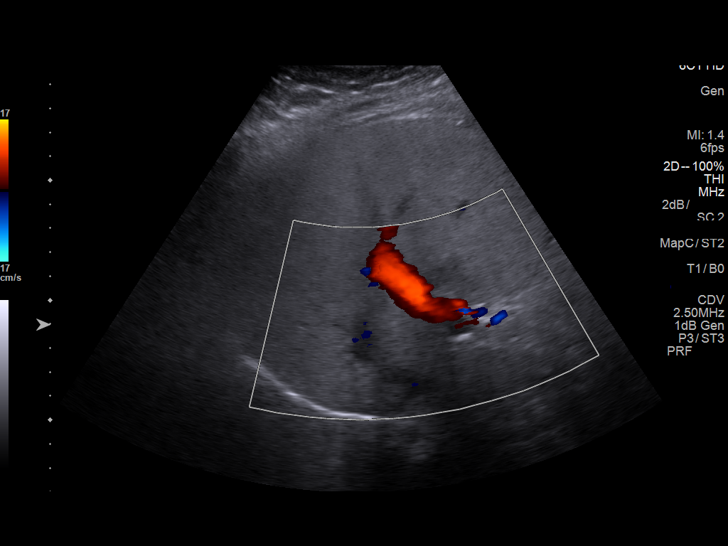
[im 30/56]
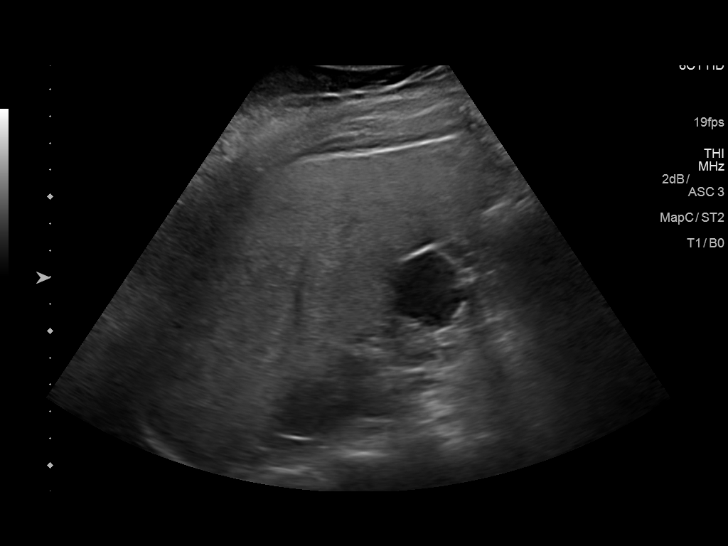
[im 35/56]
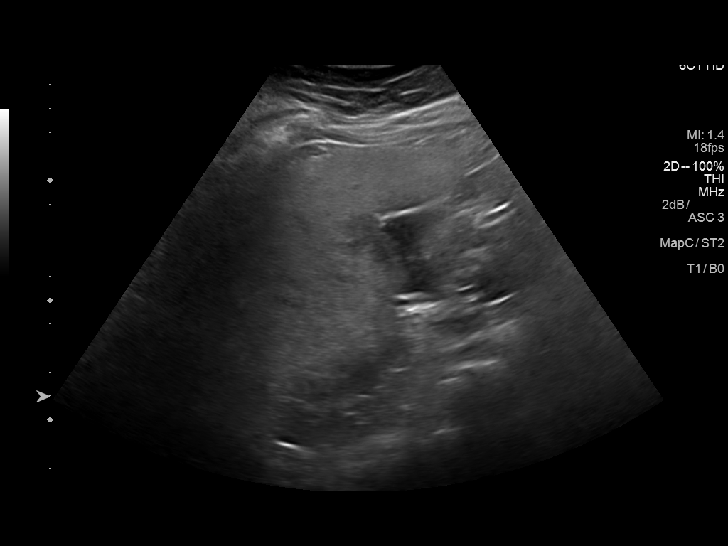
[im 37/56]
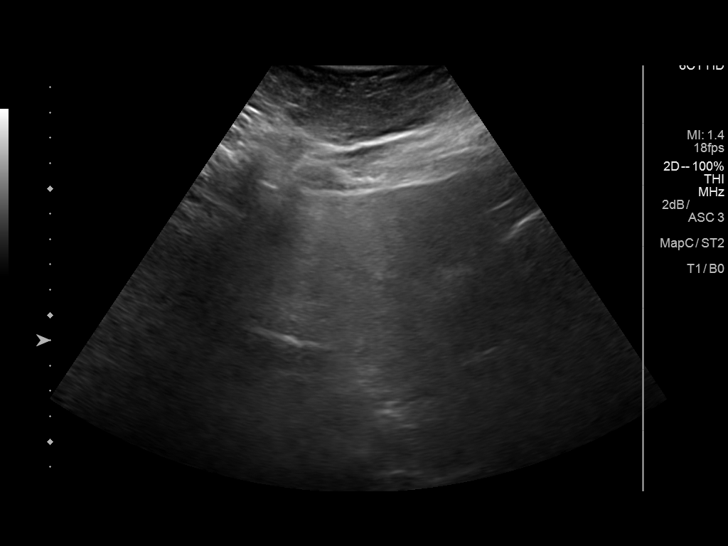
[im 42/56]
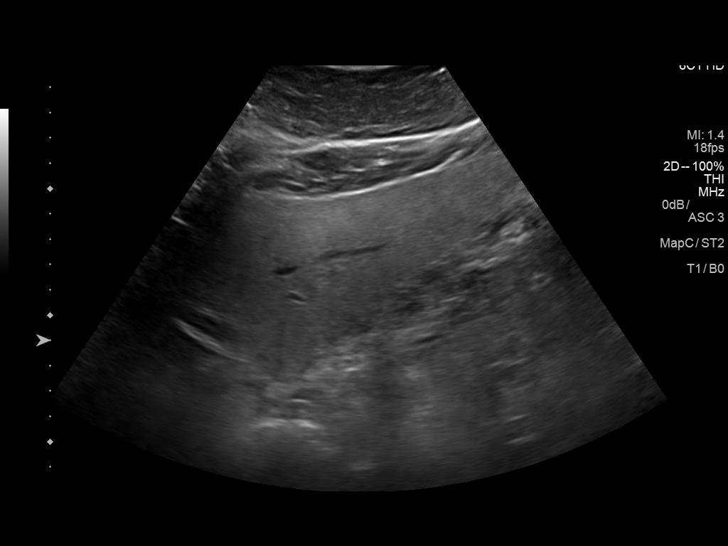
[im 46/56]
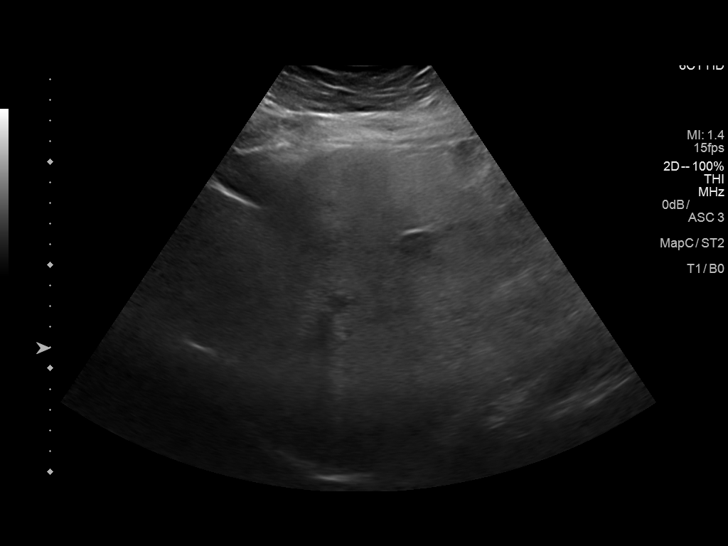
[im 51/56]
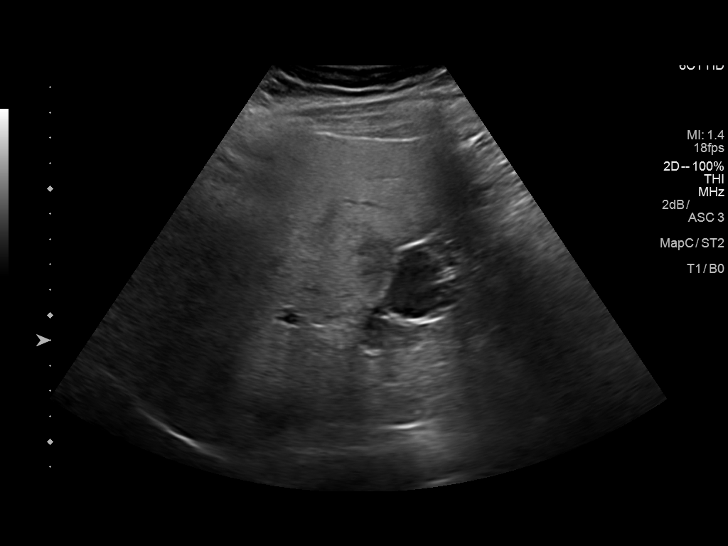
[im 56/56]
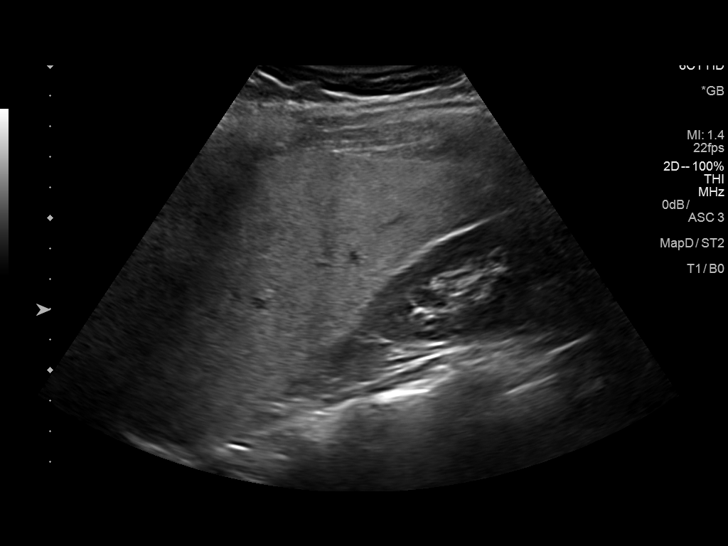

[14 of 25 positions shown; findings below may reference images not displayed]

FINDINGS: Gallbladder:

No gallstones or wall thickening visualized. No sonographic Murphy
sign noted by sonographer.

Common bile duct:

Diameter: 4 mm

Liver:

Moderately increased hepatic parenchymal echogenicity suggesting
steatosis. Small area of non-masslike decreased echogenicity
adjacent to the gallbladder compatible with fatty sparing. Portal
vein is patent on color Doppler imaging with normal direction of
blood flow towards the liver.
IMPRESSION: 1. Hepatic steatosis.
2. Normal gallbladder.  No biliary dilatation.

## 2019-09-12 IMAGING — US US HEPATIC LIVER DOPPLER
1 series · 14 of 25 positions shown · non-contrast
Comparison: Concurrently obtained right upper quadrant ultrasound.

CLINICAL DATA: 51-year-old female with diabetes, obesity and
transaminitis.

EXAM:
DUPLEX ULTRASOUND OF LIVER
TECHNIQUE: Color and duplex Doppler ultrasound was performed to evaluate the
hepatic in-flow and out-flow vessels.

[Series 1: us hepatic liver doppler · 0.28mm/px · 14 of 39 slices shown]
[im 1/39]
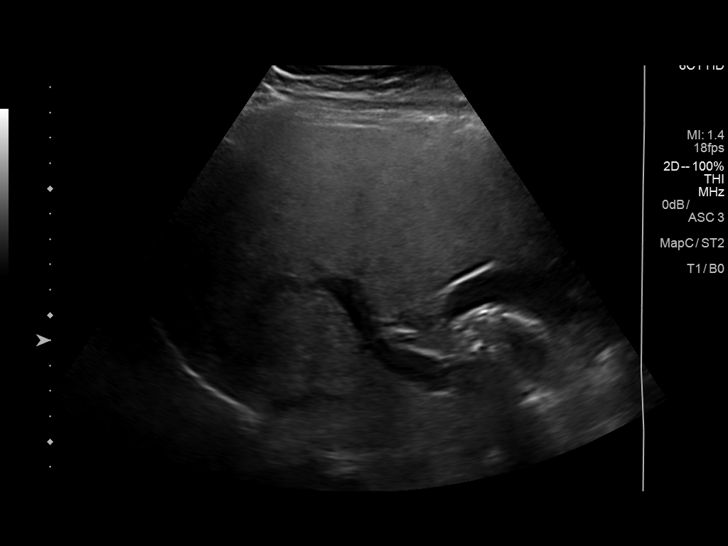
[im 4/39]
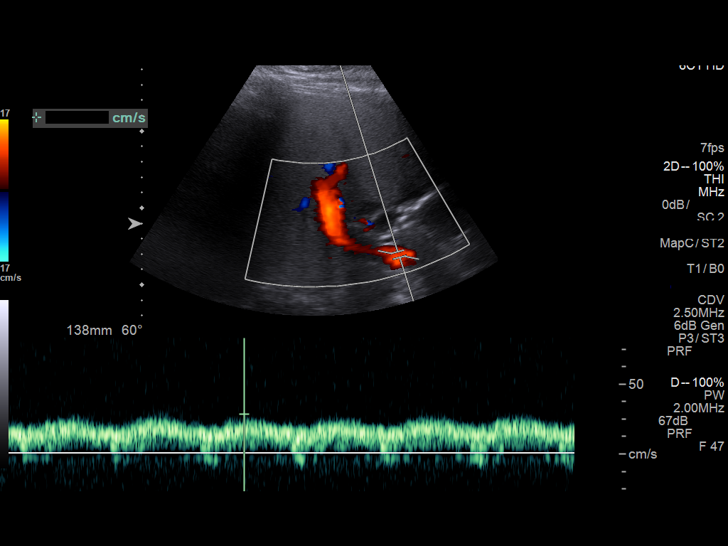
[im 7/39]
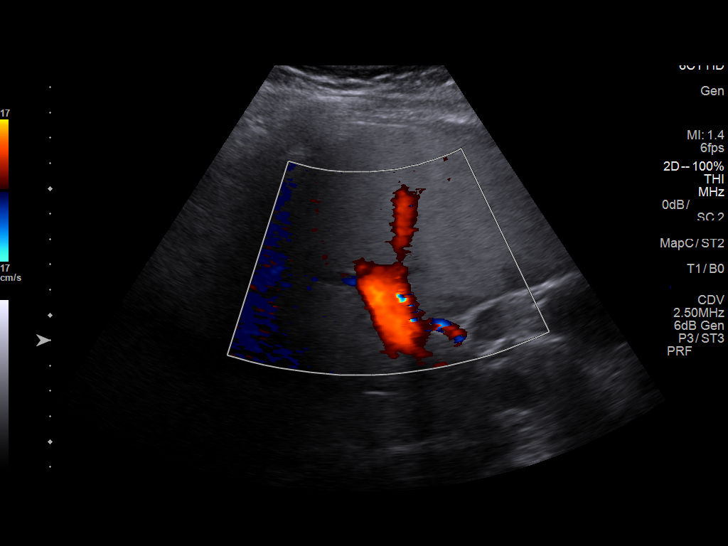
[im 10/39]
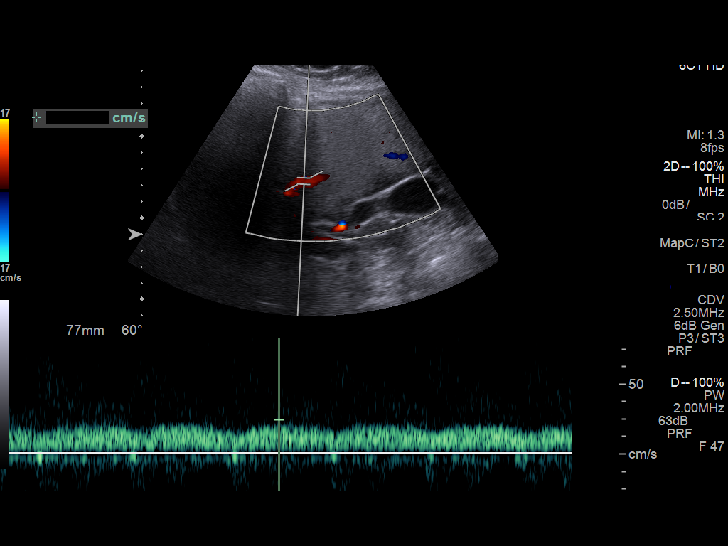
[im 13/39]
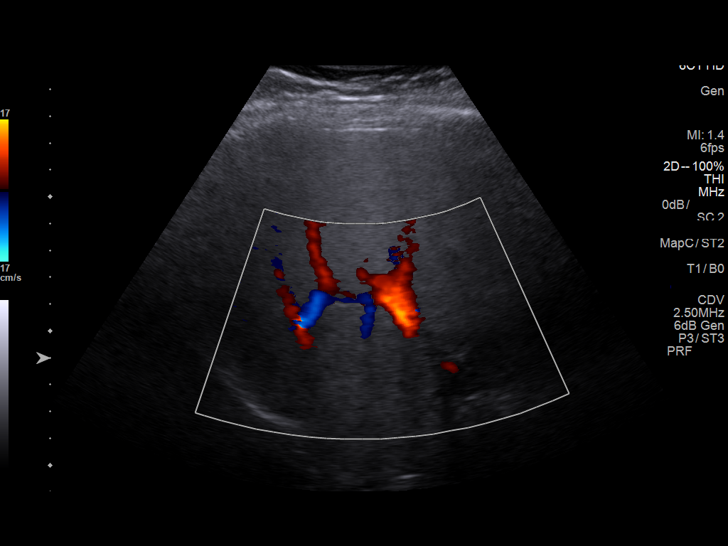
[im 15/39]
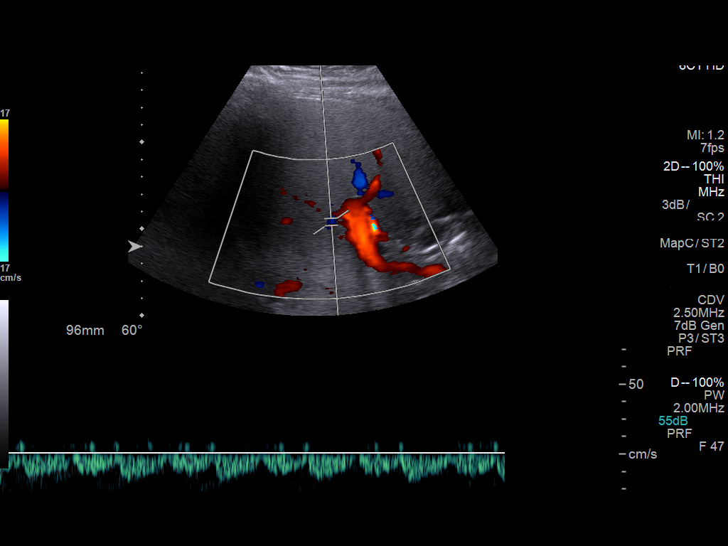
[im 18/39]
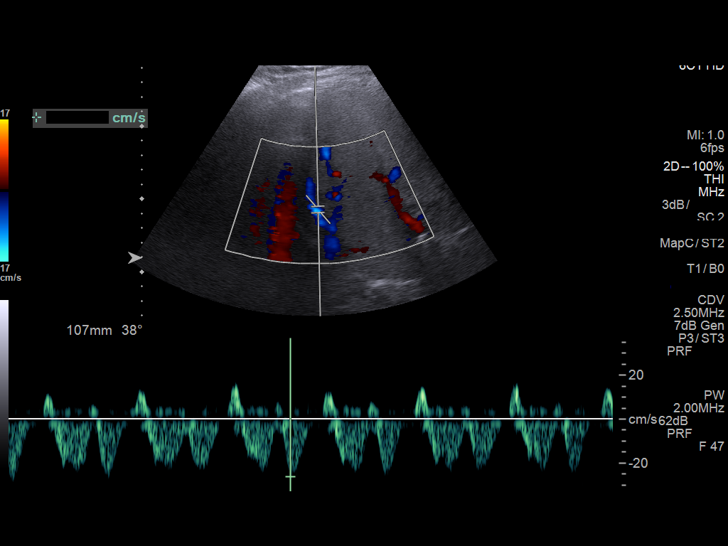
[im 21/39]
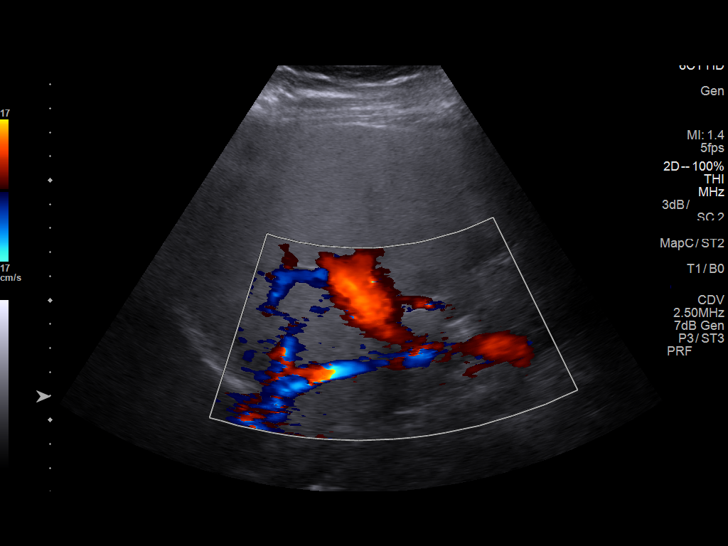
[im 24/39]
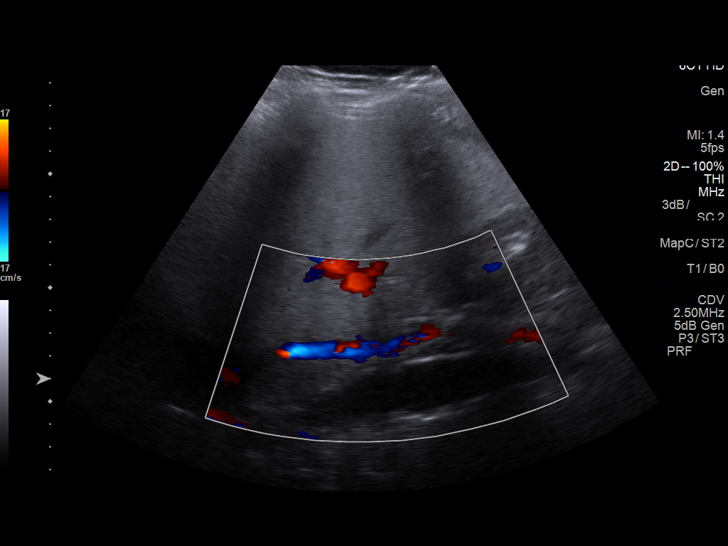
[im 26/39]
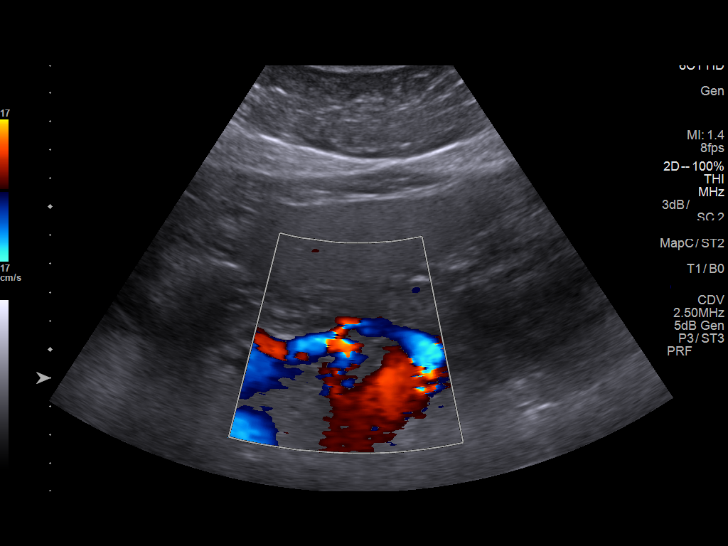
[im 29/39]
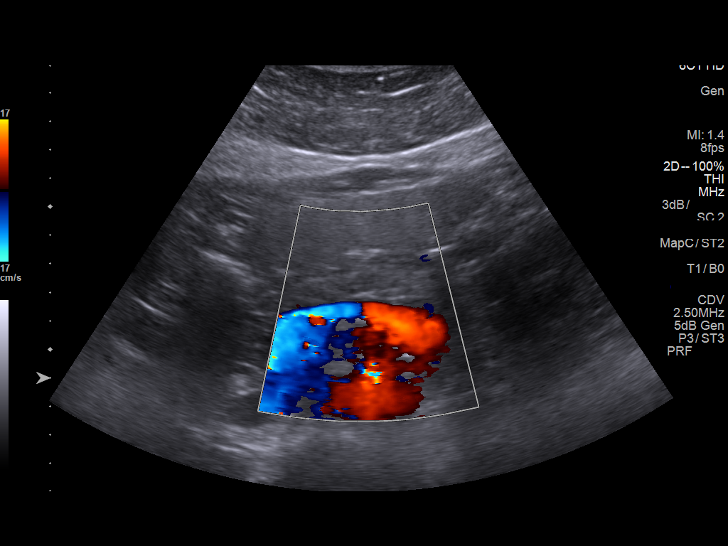
[im 32/39]
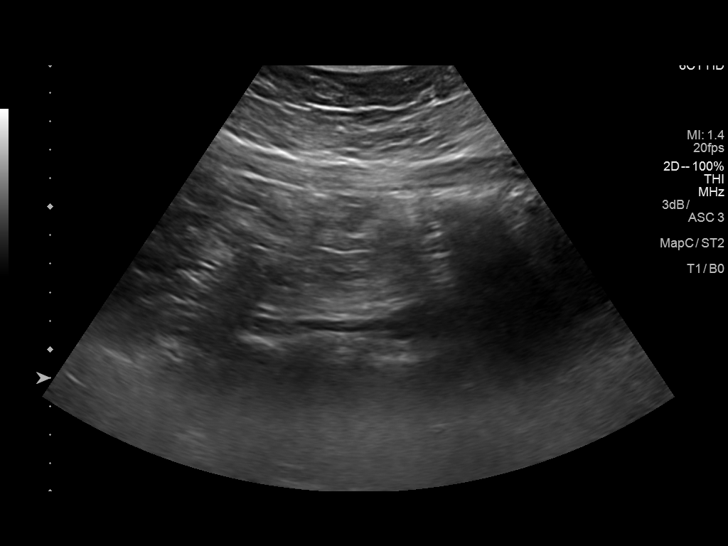
[im 35/39]
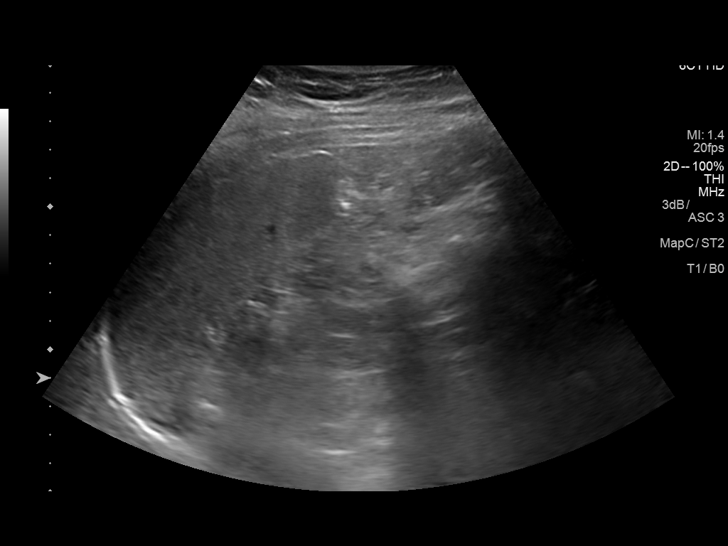
[im 39/39]
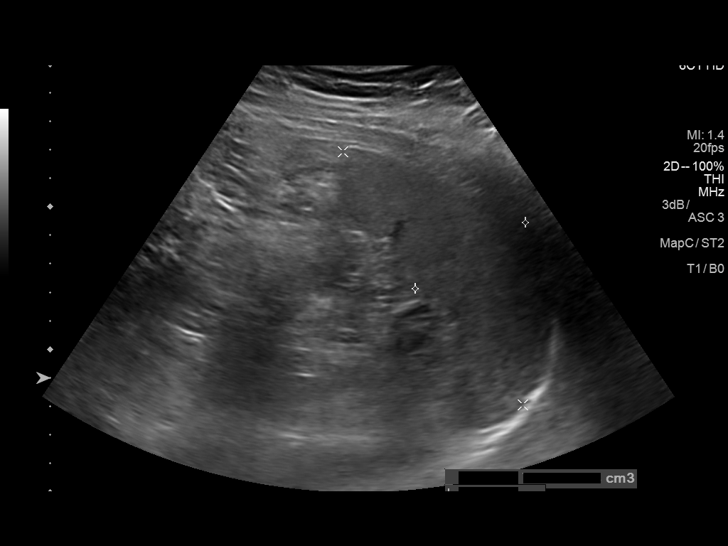

[14 of 25 positions shown; findings below may reference images not displayed]

FINDINGS: Portal Vein Velocities

Main:  18-29 cm/sec with normal hepatopetal flow.

Right:  20 cm/sec with normal hepatopetal flow.

Left:  24 cm/sec with normal hepatopetal flow.

Hepatic Vein Velocities

Right:  26 cm/sec with normal hepatic venous waveform.

Middle:  78 cm/sec with normal hepatic venous waveform.

Left:  41 cm/sec with normal hepatic venous waveform.

Hepatic Artery Velocity:  100 cm/sec

Splenic Vein Velocity:  49 cm/sec

Varices: None.

Ascites: None.

No evidence of thrombosis. The liver is diffusely heterogeneous and
echogenic with some coarsening of the echotexture. The appearance is
most consistent with hepatic steatosis.
IMPRESSION: 1. Widely patent portal and hepatic veins with normal directional
flow and venous waveforms.
2. At least moderate hepatic steatosis.
3. No evidence of ascites.

## 2019-11-09 ENCOUNTER — Other Ambulatory Visit: Payer: Self-pay | Admitting: Endocrinology

## 2019-11-09 DIAGNOSIS — Z794 Long term (current) use of insulin: Secondary | ICD-10-CM

## 2019-11-09 DIAGNOSIS — E119 Type 2 diabetes mellitus without complications: Secondary | ICD-10-CM

## 2019-12-25 ENCOUNTER — Other Ambulatory Visit: Payer: Self-pay | Admitting: Endocrinology

## 2019-12-25 DIAGNOSIS — Z794 Long term (current) use of insulin: Secondary | ICD-10-CM

## 2019-12-25 DIAGNOSIS — E119 Type 2 diabetes mellitus without complications: Secondary | ICD-10-CM

## 2020-01-01 ENCOUNTER — Ambulatory Visit: Payer: 59 | Admitting: Endocrinology

## 2020-01-03 ENCOUNTER — Other Ambulatory Visit: Payer: Self-pay

## 2020-01-04 ENCOUNTER — Ambulatory Visit: Payer: 59 | Admitting: Endocrinology

## 2020-01-15 ENCOUNTER — Other Ambulatory Visit: Payer: Self-pay

## 2020-01-17 ENCOUNTER — Encounter: Payer: Self-pay | Admitting: Endocrinology

## 2020-01-17 ENCOUNTER — Ambulatory Visit: Payer: 59 | Admitting: Endocrinology

## 2020-01-17 ENCOUNTER — Other Ambulatory Visit: Payer: Self-pay

## 2020-01-17 VITALS — BP 124/82 | HR 96 | Ht 67.0 in | Wt 222.0 lb

## 2020-01-17 DIAGNOSIS — Z794 Long term (current) use of insulin: Secondary | ICD-10-CM

## 2020-01-17 DIAGNOSIS — E119 Type 2 diabetes mellitus without complications: Secondary | ICD-10-CM | POA: Diagnosis not present

## 2020-01-17 LAB — POCT GLYCOSYLATED HEMOGLOBIN (HGB A1C): Hemoglobin A1C: 8.2 % — AB (ref 4.0–5.6)

## 2020-01-17 MED ORDER — BASAGLAR KWIKPEN 100 UNIT/ML ~~LOC~~ SOPN: 50.0000 [IU] | PEN_INJECTOR | Freq: Every day | SUBCUTANEOUS | 3 refills | Status: DC

## 2020-01-17 NOTE — Patient Instructions (Addendum)
check your blood sugar twice a day.  vary the time of day when you check, between before the 3 meals, and at bedtime.  also check if you have symptoms of your blood sugar being too high or too low.  please keep a record of the readings and bring it to your next appointment here (or you can bring the meter itself).  You can write it on any piece of paper.  please call us sooner if your blood sugar goes below 70, or if you have a lot of readings over 200.   Please increase the insulin to 50 units each morning.   Blood tests are requested for you today.  We'll let you know about the results.   Please come back for a follow-up appointment in 2 months.

## 2020-01-17 NOTE — Progress Notes (Signed)
Subjective:    Patient ID: [name], female    DOB: 1966/09/19, 54 y.o.   MRN: [id number]  HPI Pt returns for f/u of diabetes mellitus:  DM type: Insulin-requiring type 2.   Dx'ed: 9892 Complications: none Therapy: insulin since 2019 GDM: never DKA: never Severe hypoglycemia: never Pancreatitis: never Pancreatic imaging: never Other: due to h/o noncompliance, she is not a candidate for multiple daily injections; she did not tolerate metformin (back pain).   Interval history: no cbg record, but states fasting cbg's vary from 124-260.  pt states she feels well in general.  Pt says she seldom misses the insulin.   Past Medical History:  Diagnosis Date  . Acute meniscal tear of left knee   . Diabetes mellitus without complication ([place])   . History of hypothyroidism   . Prediabetes 09/20/2017   Elevated blood glucose since 2013 possibly prior.    Past Surgical History:  Procedure Laterality Date  . [place] ENDOMETRIAL ABLATION  03-16-2007  . KNEE ARTHROSCOPY Right   . KNEE ARTHROSCOPY WITH MEDIAL MENISECTOMY Left 05/17/2015   Procedure: LEFT KNEE ARTHROSCOPY WITH MEDIAL MENISECTOMY, CHONDROPLASTY PATELLA;  Surgeon: Latanya Maudlin, MD;  Location: [facility];  Service: Orthopedics;  Laterality: Left;  . LAPAROSOCPY BILATERAL TUBAL LIGATION W/ CAUTERIZATION  10-15-2000  . VAGINAL HYSTERECTOMY  01/25/2012   Procedure: HYSTERECTOMY VAGINAL;  Surgeon: Anastasio Auerbach, MD;  Location: [facility];  Service: Gynecology;  Laterality: N/A;    Social History   Socioeconomic History  . Marital status: Married    Spouse name: Not on file  . Number of children: Not on file  . Years of education: Not on file  . Highest education level: Not on file  Occupational History  . Not on file  Tobacco Use  . Smoking status: Never Smoker  . Smokeless tobacco: Never Used  Substance and Sexual Activity  . Alcohol use: No    Alcohol/week: 0.0 standard  drinks  . Drug use: No  . Sexual activity: Not Currently    Comment: DES NEG,DECLINED INSURANCE QUESTIONS  Other Topics Concern  . Not on file  Social History Narrative  . Not on file   Social Determinants of Health   Financial Resource Strain:   . Difficulty of Paying Living Expenses:   Food Insecurity:   . Worried About Charity fundraiser in the Last Year:   . Arboriculturist in the Last Year:   Transportation Needs:   . Film/video editor (Medical):   Marland Kitchen Lack of Transportation (Non-Medical):   Physical Activity:   . Days of Exercise per Week:   . Minutes of Exercise per Session:   Stress:   . Feeling of Stress :   Social Connections:   . Frequency of Communication with Friends and Family:   . Frequency of Social Gatherings with Friends and Family:   . Attends Religious Services:   . Active Member of Clubs or Organizations:   . Attends Archivist Meetings:   Marland Kitchen Marital Status:   Intimate Partner Violence:   . Fear of Current or Ex-Partner:   . Emotionally Abused:   Marland Kitchen Physically Abused:   . Sexually Abused:     Current Outpatient Medications on File Prior to Visit  Medication Sig Dispense Refill  . Accu-Chek FastClix Lancets MISC Use fastlix lancets as instructed to check blood sugar twice daily. DX:E11.59 100 each 3  . ACCU-CHEK GUIDE test strip USE ACCU CHEK  GUIDE TEST STRIPS TO CHECK BLOOD SUGAR TWICE DAILY. DX:E11.59 100 strip 3  . Blood Glucose Monitoring Suppl (ACCU-CHEK GUIDE) w/Device KIT Please specify directions, refills and quantity 1 kit 0  . fluticasone (FLONASE) 50 MCG/ACT nasal spray Place 1 spray into both nostrils daily. 16 g 2  . Insulin Pen Needle (BD ULTRA-FINE PEN NEEDLES) 29G X 12.7MM MISC 1 each by Other route daily. 90 each 1  . meloxicam (MOBIC) 15 MG tablet TAKE 1 TABLET BY MOUTH EVERY DAY 30 tablet 0   No current facility-administered medications on file prior to visit.    Allergies  Allergen Reactions  . Adhesive [Tape]  Rash    Family History  Problem Relation Age of Onset  . Hypertension Mother   . Cancer Mother        LUG, THROAT, & BACK  . Breast cancer Mother   . Cancer Father        THROAT  . Throat cancer Father   . Cancer Maternal Grandmother        KIDNEY  . Cancer Maternal Grandfather        COLON  . Colon cancer Maternal Grandfather 49  . Diabetes Daughter   . Stomach cancer Neg Hx     BP 124/82   Pulse 96   Ht '5\' 7"'  (1.702 m)   Wt 222 lb (100.7 kg)   LMP 01/03/2012   SpO2 94%   BMI 34.77 kg/m    Review of Systems She denies hypoglycemia.      Objective:   Physical Exam VITAL SIGNS:  See vs page GENERAL: no distress Pulses: dorsalis pedis intact bilat.   MSK: no deformity of the feet CV: no leg edema Skin:  no ulcer on the feet.  normal color and temp on the feet. Neuro: sensation is intact to touch on the feet.     Lab Results  Component Value Date   HGBA1C 8.2 (A) 01/17/2020       Assessment & Plan:  Insulin-requiring type 2 DM: worse.   Obesity: we discussed.  She declines to add another med.    Patient Instructions  check your blood sugar twice a day.  vary the time of day when you check, between before the 3 meals, and at bedtime.  also check if you have symptoms of your blood sugar being too high or too low.  please keep a record of the readings and bring it to your next appointment here (or you can bring the meter itself).  You can write it on any piece of paper.  please call us sooner if your blood sugar goes below 70, or if you have a lot of readings over 200.   Please increase the insulin to 50 units each morning.   Blood tests are requested for you today.  We'll let you know about the results.   Please come back for a follow-up appointment in 2 months.

## 2020-02-01 ENCOUNTER — Other Ambulatory Visit: Payer: Self-pay | Admitting: Endocrinology

## 2020-03-12 IMAGING — MG DIGITAL SCREENING BILATERAL MAMMOGRAM WITH TOMO AND CAD
6 of 10 series · 6 of 30 positions shown · non-contrast
Comparison: Previous exam(s).

CLINICAL DATA: Screening.

EXAM:
DIGITAL SCREENING BILATERAL MAMMOGRAM WITH TOMO AND CAD

[R MLO synth-2D (1 of 2)]
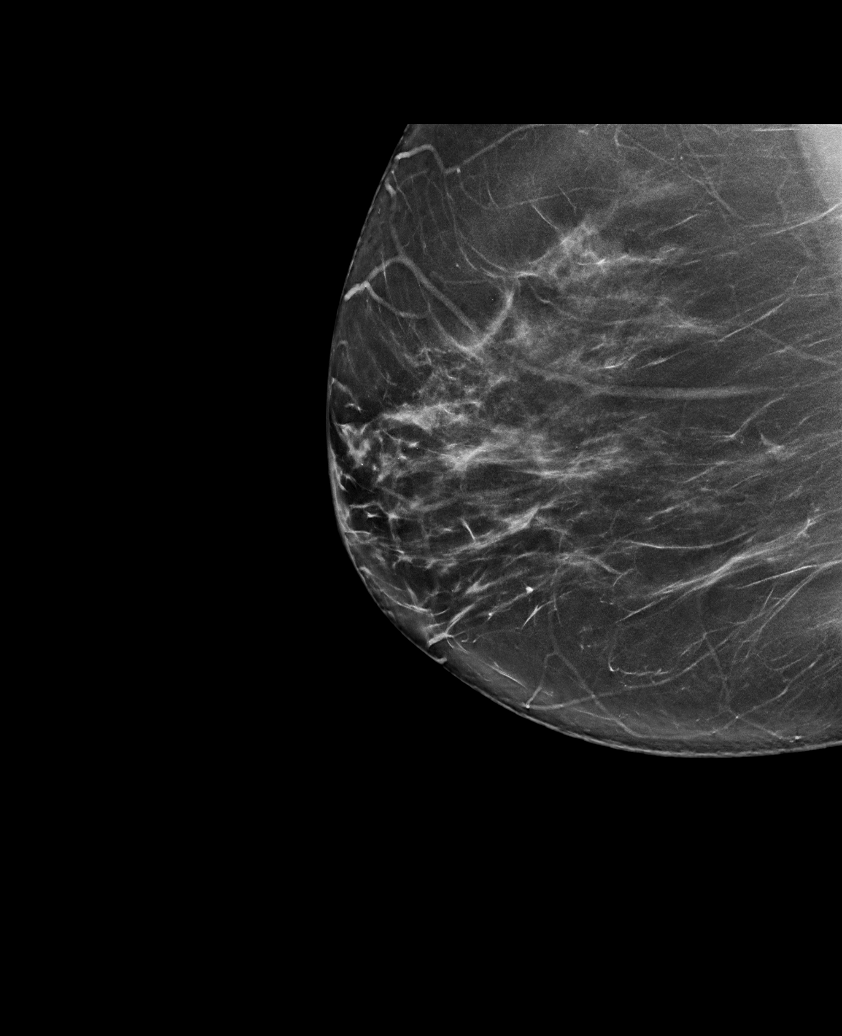

[L MLO synth-2D]
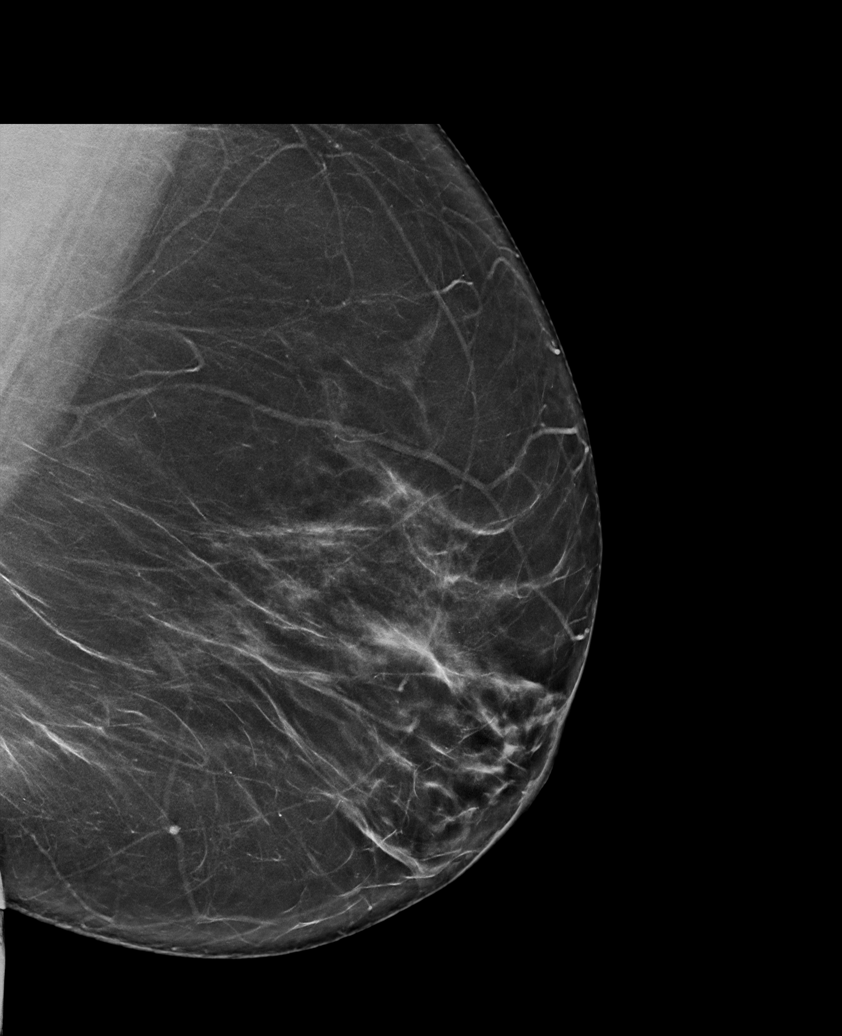

[R CC synth-2D]
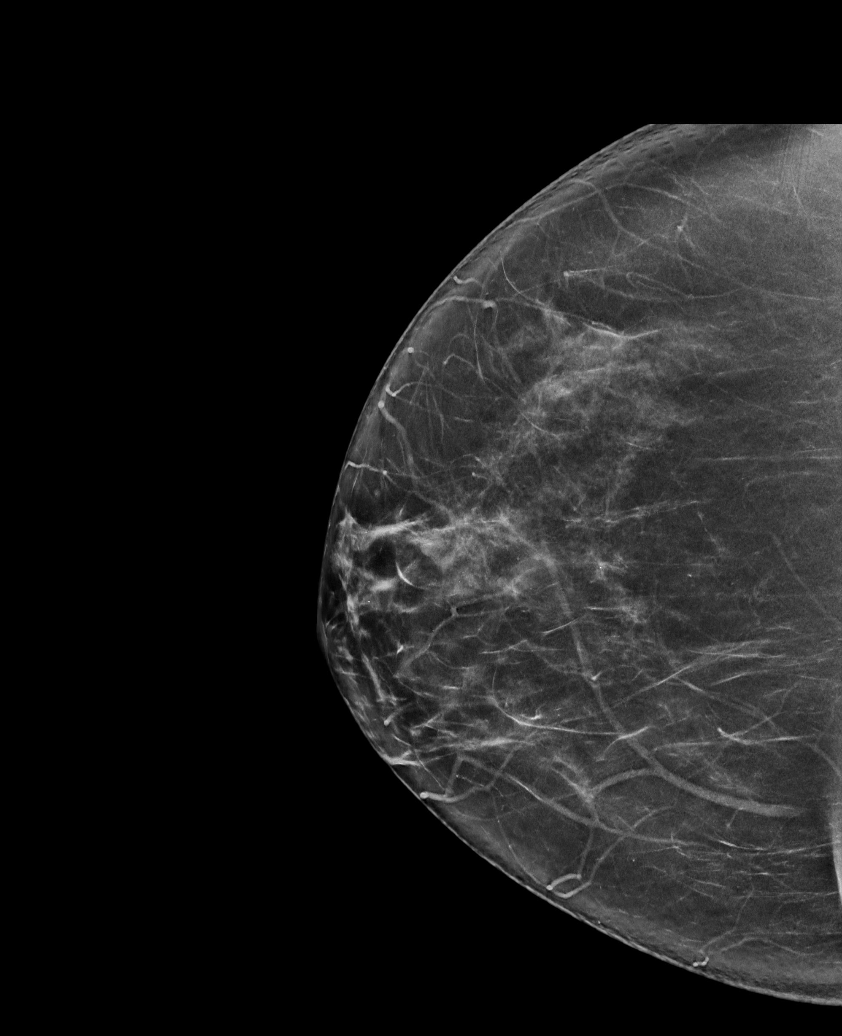

[R MLO synth-2D (2 of 2)]
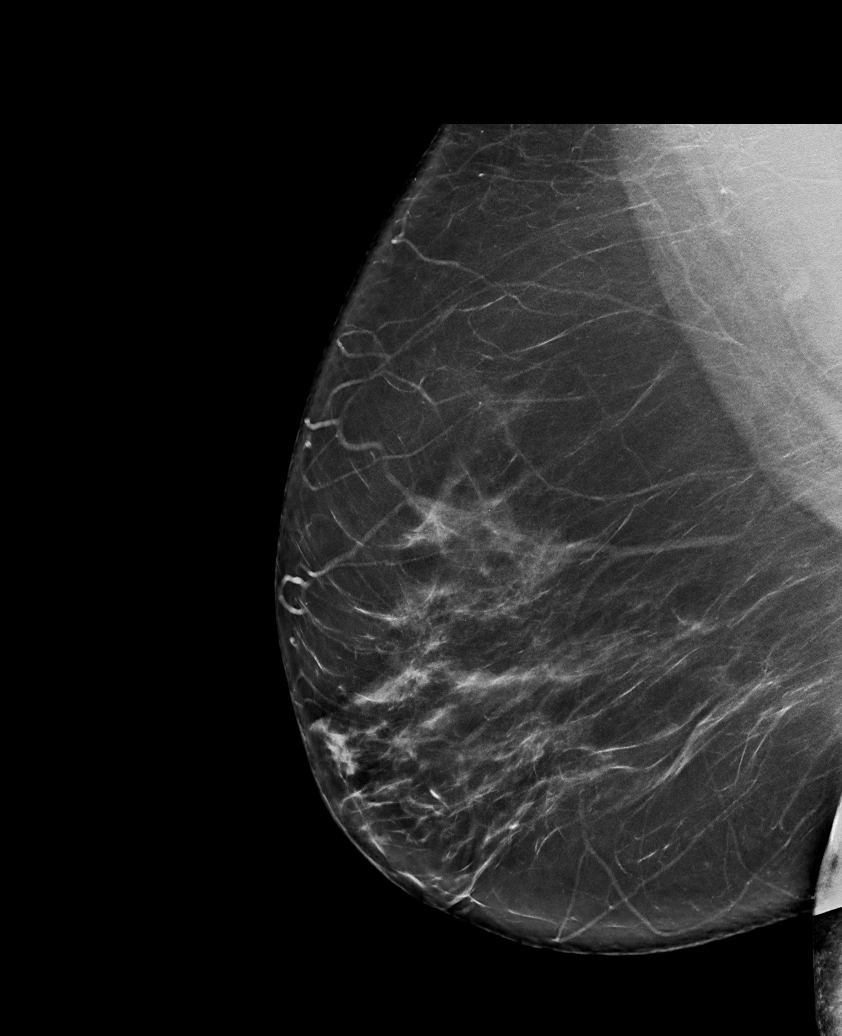

[L CC synth-2D]
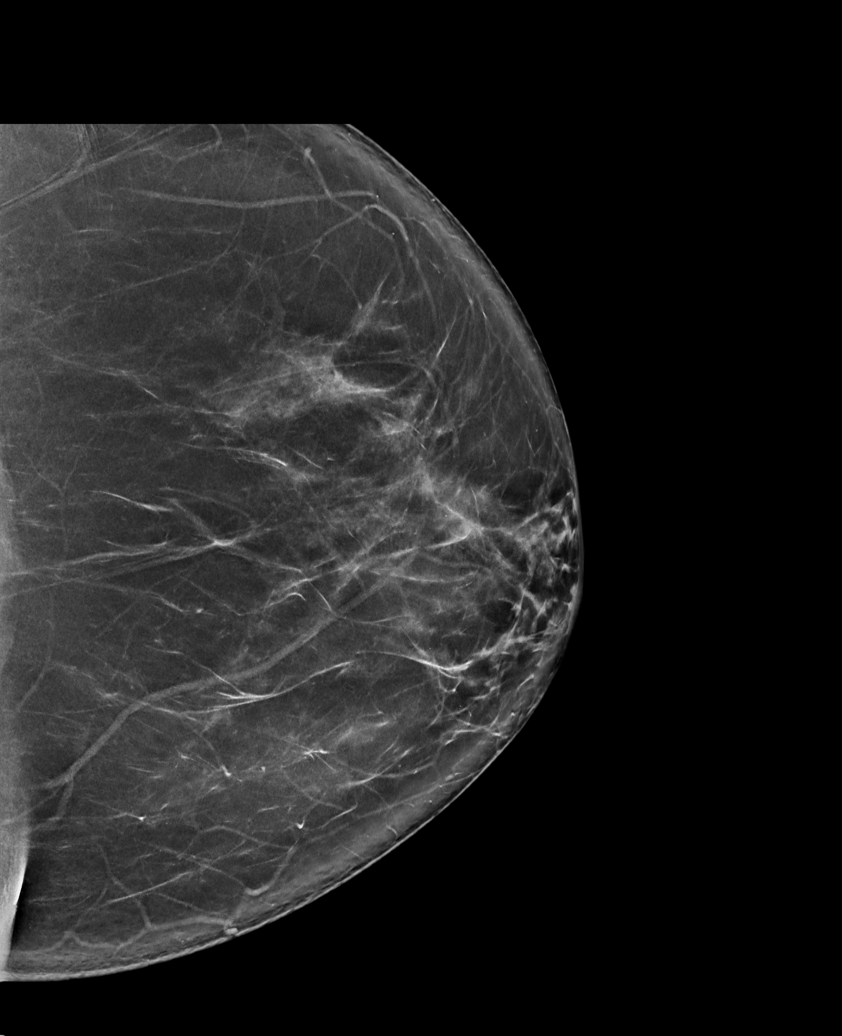

[R CC tomo · tomo slice 43/85.0]
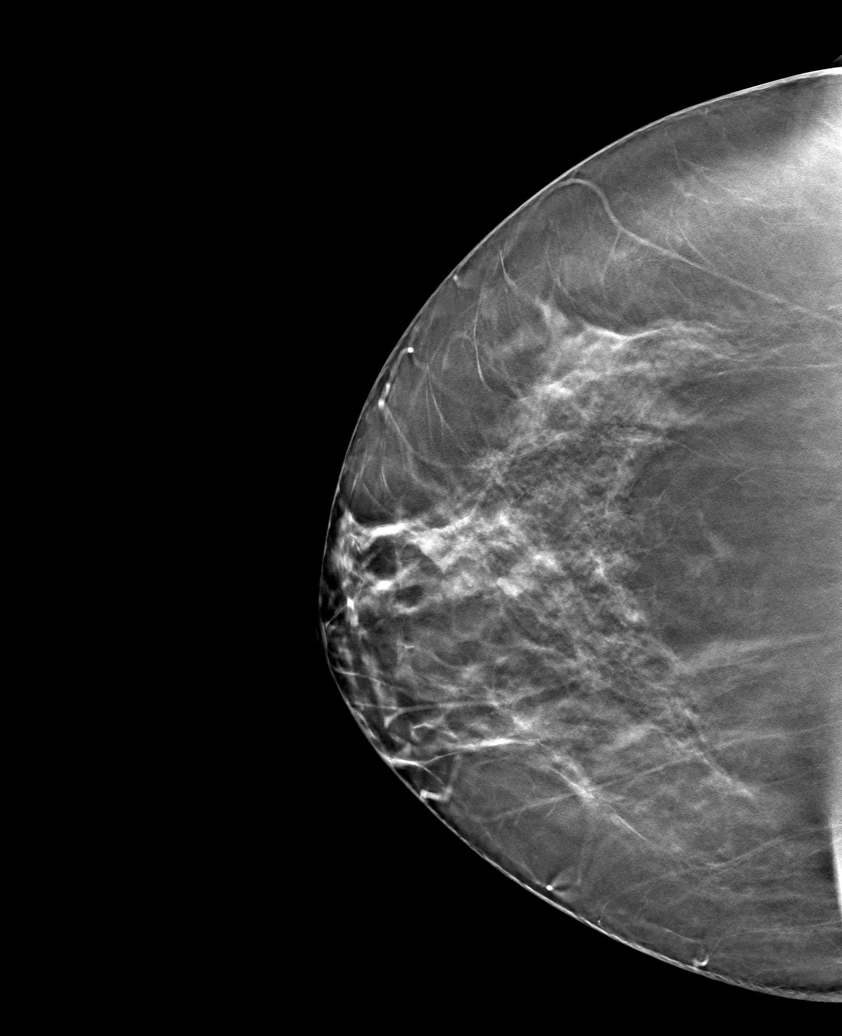

[6 of 30 positions shown; findings below may reference images not displayed]

ACR Breast Density Category b: There are scattered areas of
fibroglandular density.
FINDINGS: There are no findings suspicious for malignancy. Images were
processed with CAD.
IMPRESSION: No mammographic evidence of malignancy. A result letter of this
screening mammogram will be mailed directly to the patient.

RECOMMENDATION:
Screening mammogram in one year. (Code:[id number])

BI-RADS CATEGORY  1: Negative.

## 2020-03-21 ENCOUNTER — Ambulatory Visit: Payer: 59 | Admitting: Endocrinology

## 2020-04-08 ENCOUNTER — Other Ambulatory Visit: Payer: Self-pay | Admitting: Women's Health

## 2020-04-08 ENCOUNTER — Other Ambulatory Visit: Payer: Self-pay | Admitting: Nurse Practitioner

## 2020-04-08 DIAGNOSIS — Z1231 Encounter for screening mammogram for malignant neoplasm of breast: Secondary | ICD-10-CM

## 2020-04-16 ENCOUNTER — Other Ambulatory Visit: Payer: Self-pay | Admitting: Endocrinology

## 2020-04-16 DIAGNOSIS — Z794 Long term (current) use of insulin: Secondary | ICD-10-CM

## 2020-04-18 ENCOUNTER — Ambulatory Visit: Payer: 59 | Admitting: Endocrinology

## 2020-04-18 ENCOUNTER — Other Ambulatory Visit: Payer: Self-pay

## 2020-04-18 ENCOUNTER — Encounter: Payer: Self-pay | Admitting: Endocrinology

## 2020-04-18 VITALS — BP 134/90 | HR 76 | Ht 67.0 in | Wt 221.6 lb

## 2020-04-18 DIAGNOSIS — Z794 Long term (current) use of insulin: Secondary | ICD-10-CM

## 2020-04-18 DIAGNOSIS — E119 Type 2 diabetes mellitus without complications: Secondary | ICD-10-CM | POA: Diagnosis not present

## 2020-04-18 LAB — POCT GLYCOSYLATED HEMOGLOBIN (HGB A1C): Hemoglobin A1C: 7.4 % — AB (ref 4.0–5.6)

## 2020-04-18 MED ORDER — TRULICITY 0.75 MG/0.5ML ~~LOC~~ SOAJ: 0.7500 mg | SUBCUTANEOUS | 11 refills | Status: DC

## 2020-04-18 MED ORDER — BASAGLAR KWIKPEN 100 UNIT/ML ~~LOC~~ SOPN: 35.0000 [IU] | PEN_INJECTOR | SUBCUTANEOUS | 3 refills | Status: DC

## 2020-04-18 NOTE — Progress Notes (Signed)
Subjective:    Patient ID: [name], female    DOB: Sep 02, 1966, 54 y.o.   MRN: [id number]  HPI Pt returns for f/u of diabetes mellitus:  DM type: Insulin-requiring type 2.   Dx'ed: 7672 Complications: none Therapy: insulin since 2019 GDM: never DKA: never Severe hypoglycemia: never Pancreatitis: never Pancreatic imaging: never Other: due to h/o noncompliance, she is not a candidate for multiple daily injections; she did not tolerate metformin (back pain).   Interval history: no cbg record, but states fasting cbg's vary from 132-215.  pt states she feels well in general.  Pt says she seldom misses the insulin.   Past Medical History:  Diagnosis Date  . Acute meniscal tear of left knee   . Diabetes mellitus without complication ([place])   . History of hypothyroidism   . Prediabetes 09/20/2017   Elevated blood glucose since 2013 possibly prior.    Past Surgical History:  Procedure Laterality Date  . [place] ENDOMETRIAL ABLATION  03-16-2007  . KNEE ARTHROSCOPY Right   . KNEE ARTHROSCOPY WITH MEDIAL MENISECTOMY Left 05/17/2015   Procedure: LEFT KNEE ARTHROSCOPY WITH MEDIAL MENISECTOMY, CHONDROPLASTY PATELLA;  Surgeon: Latanya Maudlin, MD;  Location: [facility];  Service: Orthopedics;  Laterality: Left;  . LAPAROSOCPY BILATERAL TUBAL LIGATION W/ CAUTERIZATION  10-15-2000  . VAGINAL HYSTERECTOMY  01/25/2012   Procedure: HYSTERECTOMY VAGINAL;  Surgeon: Anastasio Auerbach, MD;  Location: [facility];  Service: Gynecology;  Laterality: N/A;    Social History   Socioeconomic History  . Marital status: Married    Spouse name: Not on file  . Number of children: Not on file  . Years of education: Not on file  . Highest education level: Not on file  Occupational History  . Not on file  Tobacco Use  . Smoking status: Never Smoker  . Smokeless tobacco: Never Used  Vaping Use  . Vaping Use: Never used  Substance and Sexual Activity  . Alcohol use:  No    Alcohol/week: 0.0 standard drinks  . Drug use: No  . Sexual activity: Not Currently    Comment: DES NEG,DECLINED INSURANCE QUESTIONS  Other Topics Concern  . Not on file  Social History Narrative  . Not on file   Social Determinants of Health   Financial Resource Strain:   . Difficulty of Paying Living Expenses:   Food Insecurity:   . Worried About Charity fundraiser in the Last Year:   . Arboriculturist in the Last Year:   Transportation Needs:   . Film/video editor (Medical):   Marland Kitchen Lack of Transportation (Non-Medical):   Physical Activity:   . Days of Exercise per Week:   . Minutes of Exercise per Session:   Stress:   . Feeling of Stress :   Social Connections:   . Frequency of Communication with Friends and Family:   . Frequency of Social Gatherings with Friends and Family:   . Attends Religious Services:   . Active Member of Clubs or Organizations:   . Attends Archivist Meetings:   Marland Kitchen Marital Status:   Intimate Partner Violence:   . Fear of Current or Ex-Partner:   . Emotionally Abused:   Marland Kitchen Physically Abused:   . Sexually Abused:     Current Outpatient Medications on File Prior to Visit  Medication Sig Dispense Refill  . Accu-Chek FastClix Lancets MISC USE FASTLIX LANCETS AS INSTRUCTED TO CHECK BLOOD SUGAR TWICE DAILY. DX:E11.59 102 each 3  .  ACCU-CHEK GUIDE test strip USE ACCU CHEK GUIDE TEST STRIPS TO CHECK BLOOD SUGAR TWICE DAILY. DX:E11.59 100 strip 3  . Blood Glucose Monitoring Suppl (ACCU-CHEK GUIDE) w/Device KIT Please specify directions, refills and quantity 1 kit 0  . fluticasone (FLONASE) 50 MCG/ACT nasal spray Place 1 spray into both nostrils daily. 16 g 2  . Insulin Pen Needle (BD ULTRA-FINE PEN NEEDLES) 29G X 12.7MM MISC 1 each by Other route daily. E11.9 90 each 0  . meloxicam (MOBIC) 15 MG tablet TAKE 1 TABLET BY MOUTH EVERY DAY 30 tablet 0   No current facility-administered medications on file prior to visit.    Allergies    Allergen Reactions  . Adhesive [Tape] Rash    Family History  Problem Relation Age of Onset  . Hypertension Mother   . Cancer Mother        LUG, THROAT, & BACK  . Breast cancer Mother   . Cancer Father        THROAT  . Throat cancer Father   . Cancer Maternal Grandmother        KIDNEY  . Cancer Maternal Grandfather        COLON  . Colon cancer Maternal Grandfather 25  . Diabetes Daughter   . Stomach cancer Neg Hx     BP 134/90   Pulse 76   Ht '5\' 7"'  (1.702 m)   Wt 221 lb 9.6 oz (100.5 kg)   LMP 01/03/2012   SpO2 96%   BMI 34.71 kg/m    Review of Systems She denies hypoglycemia.  She has lost 1 lb since last ov.      Objective:   Physical Exam VITAL SIGNS:  See vs page GENERAL: no distress Pulses: dorsalis pedis intact bilat.   MSK: no deformity of the feet CV: no leg edema Skin:  no ulcer on the feet.  normal color and temp on the feet. Neuro: sensation is intact to touch on the feet  Lab Results  Component Value Date   HGBA1C 7.4 (A) 04/18/2020       Assessment & Plan:  Insulin-requiring type 2 DM: She would benefit from increased rx, if it can be done with a regimen that avoids or minimizes hypoglycemia. Obesity, only slightly better.  We'll add Trulicity.    Patient Instructions  check your blood sugar twice a day.  vary the time of day when you check, between before the 3 meals, and at bedtime.  also check if you have symptoms of your blood sugar being too high or too low.  please keep a record of the readings and bring it to your next appointment here (or you can bring the meter itself).  You can write it on any piece of paper.  please call us sooner if your blood sugar goes below 70, or if you have a lot of readings over 200.   I have sent a prescription to your pharmacy, to add "Trulicity," and: Please reduce the insulin to 35 units each morning.   Please come back for a follow-up appointment in 2 months.

## 2020-04-18 NOTE — Patient Instructions (Signed)
check your blood sugar twice a day.  vary the time of day when you check, between before the 3 meals, and at bedtime.  also check if you have symptoms of your blood sugar being too high or too low.  please keep a record of the readings and bring it to your next appointment here (or you can bring the meter itself).  You can write it on any piece of paper.  please call us sooner if your blood sugar goes below 70, or if you have a lot of readings over 200.   I have sent a prescription to your pharmacy, to add "Trulicity," and: Please reduce the insulin to 35 units each morning.   Please come back for a follow-up appointment in 2 months.

## 2020-05-07 ENCOUNTER — Ambulatory Visit: Payer: 59 | Admitting: Internal Medicine

## 2020-05-24 ENCOUNTER — Ambulatory Visit
Admission: RE | Admit: 2020-05-24 | Discharge: 2020-05-24 | Disposition: A | Discharge status: Home / Self Care | Payer: 59 | Source: Ambulatory Visit | Attending: Nurse Practitioner | Admitting: Nurse Practitioner

## 2020-05-24 ENCOUNTER — Other Ambulatory Visit: Payer: Self-pay

## 2020-05-24 DIAGNOSIS — Z1231 Encounter for screening mammogram for malignant neoplasm of breast: Secondary | ICD-10-CM

## 2020-06-05 ENCOUNTER — Other Ambulatory Visit: Payer: Self-pay | Admitting: Endocrinology

## 2020-06-18 ENCOUNTER — Other Ambulatory Visit: Payer: Self-pay

## 2020-06-18 ENCOUNTER — Ambulatory Visit: Payer: 59 | Admitting: Endocrinology

## 2020-06-18 ENCOUNTER — Encounter: Payer: Self-pay | Admitting: Endocrinology

## 2020-06-18 VITALS — BP 134/88 | HR 79 | Ht 67.0 in | Wt 218.4 lb

## 2020-06-18 DIAGNOSIS — E119 Type 2 diabetes mellitus without complications: Secondary | ICD-10-CM

## 2020-06-18 DIAGNOSIS — Z794 Long term (current) use of insulin: Secondary | ICD-10-CM

## 2020-06-18 LAB — POCT GLYCOSYLATED HEMOGLOBIN (HGB A1C): Hemoglobin A1C: 6.3 % — AB (ref 4.0–5.6)

## 2020-06-18 MED ORDER — BASAGLAR KWIKPEN 100 UNIT/ML ~~LOC~~ SOPN: 20.0000 [IU] | PEN_INJECTOR | SUBCUTANEOUS | Status: DC

## 2020-06-18 MED ORDER — TRULICITY 1.5 MG/0.5ML ~~LOC~~ SOAJ: 1.5000 mg | SUBCUTANEOUS | 3 refills | Status: DC

## 2020-06-18 NOTE — Progress Notes (Signed)
Subjective:    Patient ID: [name], female    DOB: 07-Jun-1966, 54 y.o.   MRN: [id number]  HPI Pt returns for f/u of diabetes mellitus:  DM type: Insulin-requiring type 2.   Dx'ed: 6629 Complications: none Therapy: insulin since 2019 GDM: never DKA: never Severe hypoglycemia: never Pancreatitis: never Pancreatic imaging: never Other: due to h/o noncompliance, she is not a candidate for multiple daily injections; she did not tolerate metformin (back pain).   Interval history: no cbg record, but states fasting cbg's are in the low-100's.  pt states she feels well in general.  Pt says she never misses meds.  Past Medical History:  Diagnosis Date  . Acute meniscal tear of left knee   . Diabetes mellitus without complication ([place])   . History of hypothyroidism   . Prediabetes 09/20/2017   Elevated blood glucose since 2013 possibly prior.    Past Surgical History:  Procedure Laterality Date  . [place] ENDOMETRIAL ABLATION  03-16-2007  . KNEE ARTHROSCOPY Right   . KNEE ARTHROSCOPY WITH MEDIAL MENISECTOMY Left 05/17/2015   Procedure: LEFT KNEE ARTHROSCOPY WITH MEDIAL MENISECTOMY, CHONDROPLASTY PATELLA;  Surgeon: Latanya Maudlin, MD;  Location: [facility];  Service: Orthopedics;  Laterality: Left;  . LAPAROSOCPY BILATERAL TUBAL LIGATION W/ CAUTERIZATION  10-15-2000  . VAGINAL HYSTERECTOMY  01/25/2012   Procedure: HYSTERECTOMY VAGINAL;  Surgeon: Anastasio Auerbach, MD;  Location: [facility];  Service: Gynecology;  Laterality: N/A;    Social History   Socioeconomic History  . Marital status: Married    Spouse name: Not on file  . Number of children: Not on file  . Years of education: Not on file  . Highest education level: Not on file  Occupational History  . Not on file  Tobacco Use  . Smoking status: Never Smoker  . Smokeless tobacco: Never Used  Vaping Use  . Vaping Use: Never used  Substance and Sexual Activity  . Alcohol use: No     Alcohol/week: 0.0 standard drinks  . Drug use: No  . Sexual activity: Not Currently    Comment: DES NEG,DECLINED INSURANCE QUESTIONS  Other Topics Concern  . Not on file  Social History Narrative  . Not on file   Social Determinants of Health   Financial Resource Strain:   . Difficulty of Paying Living Expenses: Not on file  Food Insecurity:   . Worried About Charity fundraiser in the Last Year: Not on file  . Ran Out of Food in the Last Year: Not on file  Transportation Needs:   . Lack of Transportation (Medical): Not on file  . Lack of Transportation (Non-Medical): Not on file  Physical Activity:   . Days of Exercise per Week: Not on file  . Minutes of Exercise per Session: Not on file  Stress:   . Feeling of Stress : Not on file  Social Connections:   . Frequency of Communication with Friends and Family: Not on file  . Frequency of Social Gatherings with Friends and Family: Not on file  . Attends Religious Services: Not on file  . Active Member of Clubs or Organizations: Not on file  . Attends Archivist Meetings: Not on file  . Marital Status: Not on file  Intimate Partner Violence:   . Fear of Current or Ex-Partner: Not on file  . Emotionally Abused: Not on file  . Physically Abused: Not on file  . Sexually Abused: Not on file  Current Outpatient Medications on File Prior to Visit  Medication Sig Dispense Refill  . Accu-Chek FastClix Lancets MISC USE FASTLIX LANCETS AS INSTRUCTED TO CHECK BLOOD SUGAR TWICE DAILY. DX:E11.59 (Patient taking differently: 1 each by Other route daily. E11.9) 102 each 3  . Blood Glucose Monitoring Suppl (ACCU-CHEK GUIDE) w/Device KIT Please specify directions, refills and quantity (Patient taking differently: 1 each by Other route daily. E11.9) 1 kit 0  . fluticasone (FLONASE) 50 MCG/ACT nasal spray Place 1 spray into both nostrils daily. 16 g 2  . glucose blood (ACCU-CHEK GUIDE) test strip 1 each by Other route 2 (two) times  daily. E11.9 (Patient taking differently: 1 each daily. E11.9) 200 strip 0  . Insulin Pen Needle (BD ULTRA-FINE PEN NEEDLES) 29G X 12.7MM MISC 1 each by Other route daily. E11.9 90 each 0  . meloxicam (MOBIC) 15 MG tablet TAKE 1 TABLET BY MOUTH EVERY DAY 30 tablet 0   No current facility-administered medications on file prior to visit.    Allergies  Allergen Reactions  . Adhesive [Tape] Rash    Family History  Problem Relation Age of Onset  . Hypertension Mother   . Cancer Mother        LUG, THROAT, & BACK  . Breast cancer Mother   . Cancer Father        THROAT  . Throat cancer Father   . Cancer Maternal Grandmother        KIDNEY  . Cancer Maternal Grandfather        COLON  . Colon cancer Maternal Grandfather 78  . Diabetes Daughter   . Stomach cancer Neg Hx     BP 134/88   Pulse 79   Ht '5\' 7"'  (1.702 m)   Wt 218 lb 6.4 oz (99.1 kg)   LMP 01/03/2012   SpO2 96%   BMI 34.21 kg/m    Review of Systems She has lost a few lbs.  Denies nausea. He denies hypoglycemia    Objective:   Physical Exam VITAL SIGNS:  See vs page GENERAL: no distress Pulses: dorsalis pedis intact bilat.   MSK: no deformity of the feet CV: no leg edema Skin:  no ulcer on the feet.  normal color and temp on the feet. Neuro: sensation is intact to touch on the feet.     Lab Results  Component Value Date   HGBA1C 6.3 (A) 06/18/2020       Assessment & Plan:  Insulin-requiring type 2 DM: overcontrolled.   Patient Instructions  check your blood sugar twice a day.  vary the time of day when you check, between before the 3 meals, and at bedtime.  also check if you have symptoms of your blood sugar being too high or too low.  please keep a record of the readings and bring it to your next appointment here (or you can bring the meter itself).  You can write it on any piece of paper.  please call us sooner if your blood sugar goes below 70, or if you have a lot of readings over 200.   I have sent a  prescription to your pharmacy, to increase the Trulicity, and: Please reduce the insulin to 20 units each morning.   Please come back for a follow-up appointment in 2 months.

## 2020-06-18 NOTE — Patient Instructions (Addendum)
check your blood sugar twice a day.  vary the time of day when you check, between before the 3 meals, and at bedtime.  also check if you have symptoms of your blood sugar being too high or too low.  please keep a record of the readings and bring it to your next appointment here (or you can bring the meter itself).  You can write it on any piece of paper.  please call us sooner if your blood sugar goes below 70, or if you have a lot of readings over 200.   I have sent a prescription to your pharmacy, to increase the Trulicity, and: Please reduce the insulin to 20 units each morning.   Please come back for a follow-up appointment in 2 months.

## 2020-06-27 ENCOUNTER — Ambulatory Visit: Payer: 59

## 2020-06-27 ENCOUNTER — Other Ambulatory Visit: Payer: Self-pay

## 2020-06-27 LAB — HM DIABETES EYE EXAM

## 2020-08-06 ENCOUNTER — Other Ambulatory Visit: Payer: Self-pay | Admitting: Endocrinology

## 2020-08-06 DIAGNOSIS — E119 Type 2 diabetes mellitus without complications: Secondary | ICD-10-CM

## 2020-08-06 DIAGNOSIS — Z794 Long term (current) use of insulin: Secondary | ICD-10-CM

## 2020-08-21 ENCOUNTER — Encounter: Payer: Self-pay | Admitting: Endocrinology

## 2020-08-21 ENCOUNTER — Ambulatory Visit: Payer: 59 | Admitting: Endocrinology

## 2020-08-21 ENCOUNTER — Other Ambulatory Visit: Payer: Self-pay

## 2020-08-21 VITALS — BP 130/88 | HR 73 | Ht 67.0 in | Wt 217.0 lb

## 2020-08-21 DIAGNOSIS — Z794 Long term (current) use of insulin: Secondary | ICD-10-CM

## 2020-08-21 DIAGNOSIS — E119 Type 2 diabetes mellitus without complications: Secondary | ICD-10-CM

## 2020-08-21 MED ORDER — BASAGLAR KWIKPEN 100 UNIT/ML ~~LOC~~ SOPN: 10.0000 [IU] | PEN_INJECTOR | SUBCUTANEOUS | Status: DC

## 2020-08-21 NOTE — Patient Instructions (Addendum)
check your blood sugar twice a day.  vary the time of day when you check, between before the 3 meals, and at bedtime.  also check if you have symptoms of your blood sugar being too high or too low.  please keep a record of the readings and bring it to your next appointment here (or you can bring the meter itself).  You can write it on any piece of paper.  please call us sooner if your blood sugar goes below 70, or if you have a lot of readings over 200.   Please continue the sameTrulicity, and: Please reduce the insulin to 10 units each morning.   Please come back for a follow-up appointment in 6 weeks.

## 2020-08-21 NOTE — Progress Notes (Signed)
Subjective:    Patient ID: [name], female    DOB: 07-29-1966, 54 y.o.   MRN: [id number]  HPI Pt returns for f/u of diabetes mellitus:  DM type: Insulin-requiring type 2.   Dx'ed: 5520 Complications: none Therapy: insulin since 8022, and Trulicity GDM: never DKA: never Severe hypoglycemia: never Pancreatitis: never Pancreatic imaging: never Other: due to h/o noncompliance, she is not a candidate for multiple daily injections; she did not tolerate metformin (back pain).   Interval history: no cbg record, but states fasting cbg's vary from 90-135.  pt states she feels well in general.  Pt says she never misses meds.  Past Medical History:  Diagnosis Date  . Acute meniscal tear of left knee   . Diabetes mellitus without complication ([place])   . History of hypothyroidism   . Prediabetes 09/20/2017   Elevated blood glucose since 2013 possibly prior.    Past Surgical History:  Procedure Laterality Date  . [place] ENDOMETRIAL ABLATION  03-16-2007  . KNEE ARTHROSCOPY Right   . KNEE ARTHROSCOPY WITH MEDIAL MENISECTOMY Left 05/17/2015   Procedure: LEFT KNEE ARTHROSCOPY WITH MEDIAL MENISECTOMY, CHONDROPLASTY PATELLA;  Surgeon: Latanya Maudlin, MD;  Location: [facility];  Service: Orthopedics;  Laterality: Left;  . LAPAROSOCPY BILATERAL TUBAL LIGATION W/ CAUTERIZATION  10-15-2000  . VAGINAL HYSTERECTOMY  01/25/2012   Procedure: HYSTERECTOMY VAGINAL;  Surgeon: Anastasio Auerbach, MD;  Location: [facility];  Service: Gynecology;  Laterality: N/A;    Social History   Socioeconomic History  . Marital status: Married    Spouse name: Not on file  . Number of children: Not on file  . Years of education: Not on file  . Highest education level: Not on file  Occupational History  . Not on file  Tobacco Use  . Smoking status: Never Smoker  . Smokeless tobacco: Never Used  Vaping Use  . Vaping Use: Never used  Substance and Sexual Activity  . Alcohol  use: No    Alcohol/week: 0.0 standard drinks  . Drug use: No  . Sexual activity: Not Currently    Comment: DES NEG,DECLINED INSURANCE QUESTIONS  Other Topics Concern  . Not on file  Social History Narrative  . Not on file   Social Determinants of Health   Financial Resource Strain:   . Difficulty of Paying Living Expenses: Not on file  Food Insecurity:   . Worried About Charity fundraiser in the Last Year: Not on file  . Ran Out of Food in the Last Year: Not on file  Transportation Needs:   . Lack of Transportation (Medical): Not on file  . Lack of Transportation (Non-Medical): Not on file  Physical Activity:   . Days of Exercise per Week: Not on file  . Minutes of Exercise per Session: Not on file  Stress:   . Feeling of Stress : Not on file  Social Connections:   . Frequency of Communication with Friends and Family: Not on file  . Frequency of Social Gatherings with Friends and Family: Not on file  . Attends Religious Services: Not on file  . Active Member of Clubs or Organizations: Not on file  . Attends Archivist Meetings: Not on file  . Marital Status: Not on file  Intimate Partner Violence:   . Fear of Current or Ex-Partner: Not on file  . Emotionally Abused: Not on file  . Physically Abused: Not on file  . Sexually Abused: Not on file  Current Outpatient Medications on File Prior to Visit  Medication Sig Dispense Refill  . Accu-Chek FastClix Lancets MISC USE FASTLIX LANCETS AS INSTRUCTED TO CHECK BLOOD SUGAR TWICE DAILY. DX:E11.59 (Patient taking differently: 1 each by Other route daily. E11.9) 102 each 3  . BD ULTRA-FINE PEN NEEDLES 29G X 12.7MM MISC USE ONCE DAILY 100 each 0  . Blood Glucose Monitoring Suppl (ACCU-CHEK GUIDE) w/Device KIT Please specify directions, refills and quantity (Patient taking differently: 1 each by Other route daily. E11.9) 1 kit 0  . Dulaglutide (TRULICITY) 1.5 HD/6.2IW SOPN Inject 0.5 mLs (1.5 mg total) into the skin once  a week. 6 mL 3  . fluticasone (FLONASE) 50 MCG/ACT nasal spray Place 1 spray into both nostrils daily. 16 g 2  . glucose blood (ACCU-CHEK GUIDE) test strip 1 each by Other route 2 (two) times daily. E11.9 (Patient taking differently: 1 each daily. E11.9) 200 strip 0  . meloxicam (MOBIC) 15 MG tablet TAKE 1 TABLET BY MOUTH EVERY DAY 30 tablet 0   No current facility-administered medications on file prior to visit.    Allergies  Allergen Reactions  . Adhesive [Tape] Rash    Family History  Problem Relation Age of Onset  . Hypertension Mother   . Cancer Mother        LUG, THROAT, & BACK  . Breast cancer Mother   . Cancer Father        THROAT  . Throat cancer Father   . Cancer Maternal Grandmother        KIDNEY  . Cancer Maternal Grandfather        COLON  . Colon cancer Maternal Grandfather 34  . Diabetes Daughter   . Stomach cancer Neg Hx     BP 130/88   Pulse 73   Ht '5\' 7"'  (1.702 m)   Wt 217 lb (98.4 kg)   LMP 01/03/2012   SpO2 98%   BMI 33.99 kg/m    Review of Systems Denies nausea    Objective:   Physical Exam VITAL SIGNS:  See vs page GENERAL: no distress Pulses: dorsalis pedis intact bilat.   MSK: no deformity of the feet CV: no leg edema Skin:  no ulcer on the feet.  normal color and temp on the feet. Neuro: sensation is intact to touch on the feet.   A1c=6.0%   Lab Results  Component Value Date   CREATININE 0.90 08/28/2019   BUN 16 08/28/2019   NA 135 08/28/2019   K 3.8 08/28/2019   CL 99 08/28/2019   CO2 28 08/28/2019       Assessment & Plan:  Insulin-requiring type 2 DM: overcontrolled.  She declines to d/c insulin altogether now.  Patient Instructions  check your blood sugar twice a day.  vary the time of day when you check, between before the 3 meals, and at bedtime.  also check if you have symptoms of your blood sugar being too high or too low.  please keep a record of the readings and bring it to your next appointment here (or you can  bring the meter itself).  You can write it on any piece of paper.  please call us sooner if your blood sugar goes below 70, or if you have a lot of readings over 200.   Please continue the sameTrulicity, and: Please reduce the insulin to 10 units each morning.   Please come back for a follow-up appointment in 6 weeks.

## 2020-08-23 ENCOUNTER — Telehealth: Payer: Self-pay | Admitting: *Deleted

## 2020-08-23 NOTE — Telephone Encounter (Signed)
Notified pt eye exam results--normal

## 2020-08-25 ENCOUNTER — Ambulatory Visit: Payer: Self-pay

## 2020-08-26 ENCOUNTER — Ambulatory Visit
Admission: RE | Admit: 2020-08-26 | Discharge: 2020-08-26 | Disposition: A | Discharge status: Home / Self Care | Payer: 59 | Source: Ambulatory Visit | Attending: Emergency Medicine | Admitting: Emergency Medicine

## 2020-08-26 ENCOUNTER — Other Ambulatory Visit: Payer: Self-pay

## 2020-08-26 VITALS — BP 131/92 | HR 98 | Temp 98.2°F | Resp 20

## 2020-08-26 DIAGNOSIS — N23 Unspecified renal colic: Secondary | ICD-10-CM

## 2020-08-26 LAB — POCT FASTING CBG KUC MANUAL ENTRY: POCT Glucose (KUC): 111 mg/dL — AB (ref 70–99)

## 2020-08-26 LAB — POCT URINALYSIS DIP (MANUAL ENTRY)
Glucose, UA: 100 mg/dL — AB
Ketones, POC UA: NEGATIVE mg/dL
Leukocytes, UA: NEGATIVE
Nitrite, UA: NEGATIVE
Protein Ur, POC: NEGATIVE mg/dL
Spec Grav, UA: >=1.03 — AB (ref 1.010–1.025)
Urobilinogen, UA: 4 E.U./dL — AB
pH, UA: 5.5 (ref 5.0–8.0)

## 2020-08-26 MED ORDER — KETOROLAC TROMETHAMINE 10 MG PO TABS: 10.0000 mg | ORAL_TABLET | Freq: Four times a day (QID) | ORAL | 0 refills | Status: DC | PRN

## 2020-08-26 MED ORDER — KETOROLAC TROMETHAMINE 30 MG/ML IJ SOLN
30.0000 mg | Freq: Once | INTRAMUSCULAR | Status: AC
  Administered 2020-08-26: 30 mg via INTRAMUSCULAR

## 2020-08-26 MED ORDER — ONDANSETRON 4 MG PO TBDP: 4.0000 mg | ORAL_TABLET | Freq: Three times a day (TID) | ORAL | 0 refills | Status: DC | PRN

## 2020-08-26 NOTE — Discharge Instructions (Addendum)
Drink plenty of fluids (water, sugar-free drinks) throughout the day. May take Toradol as needed for pain: No ibuprofen, Motrin, Advil, Aleve, naproxen (NSAIDs) while taking this. Zofran as needed for nausea. Use urine strainer when urinating.   If you catch a stone, please follow-up with your PCP or urology for further evaluation. Go to ER for blood in urine, worsening abdominal or back pain, vomiting, fever.

## 2020-08-26 NOTE — ED Triage Notes (Signed)
Pt states she has been having lower back/flank pain since Saturday and has not been injured to cause the pain. Pt states she is concerned it may be a UTI. Pt is aox4 and ambulatory.

## 2020-08-26 NOTE — ED Provider Notes (Signed)
[facility]    CSN: [id number] Arrival date & time: 08/26/20  1712      History   Chief Complaint Chief Complaint  Patient presents with  . Back Pain    since saturday    HPI [name] is a 54 y.o. female  Resenting for right flank pain since Saturday.  States is progressively getting worse.  Denies rash, inciting event or injury.  Concern for UTI given urinary frequency.  States her sugars have been well controlled: Checks daily.  No nausea, vomiting, fever, hematuria.  Denies history of renal calculi.  Past Medical History:  Diagnosis Date  . Acute meniscal tear of left knee   . Diabetes mellitus without complication ([place])   . History of hypothyroidism   . Prediabetes 2017-10-14   Elevated blood glucose since 2011/12/20 possibly prior.    Patient Active Problem List   Diagnosis Date Noted  . Chronic midline low back pain without sciatica 02/13/2018  . Mixed diabetic hyperlipidemia associated with type 2 diabetes mellitus ([place]) 10/25/2017  . Hypertension associated with diabetes ([place]) 10/25/2017  . Diabetes mellitus, type II ([place]) 10/25/2017  . H/O total hysterectomy- benign reasons 2017-10-14  . Left knee pain- Dr Amedeo Plenty 2017/10/14  . Perimenopausal Oct 14, 2017  . Family history of breast cancer in mother-deceased late 12-19-22 14-Oct-2017  . Family history of colon cancer- MGF- in 60's October 14, 2017  . Blood glucose elevated 10-14-17  . Elevated alanine aminotransferase (ALT) level 10-14-17  . Low HDL (under 40) 2017/10/14  . Severe obesity (BMI 35.0-35.9 with comorbidity) ([place]) 10-14-2017  . Vitamin D deficiency October 14, 2017  . Family history of diabetes mellitus (DM)- in daughter 10/14/2017  . Hypothyroidism 02/08/2013    Past Surgical History:  Procedure Laterality Date  . [place] ENDOMETRIAL ABLATION  03-16-2007  . KNEE ARTHROSCOPY Right   . KNEE ARTHROSCOPY WITH MEDIAL MENISECTOMY Left 05/17/2015   Procedure: LEFT KNEE  ARTHROSCOPY WITH MEDIAL MENISECTOMY, CHONDROPLASTY PATELLA;  Surgeon: Latanya Maudlin, MD;  Location: [facility];  Service: Orthopedics;  Laterality: Left;  . LAPAROSOCPY BILATERAL TUBAL LIGATION W/ CAUTERIZATION  10-15-2000  . VAGINAL HYSTERECTOMY  01/25/2012   Procedure: HYSTERECTOMY VAGINAL;  Surgeon: Anastasio Auerbach, MD;  Location: [facility];  Service: Gynecology;  Laterality: N/A;    OB History    Gravida  7   Para  4   Term      Preterm      AB  3   Living  4     SAB  3   TAB      Ectopic      Multiple      Live Births               Home Medications    Prior to Admission medications   Medication Sig Start Date End Date Taking? Authorizing Provider  Accu-Chek FastClix Lancets MISC USE FASTLIX LANCETS AS INSTRUCTED TO CHECK BLOOD SUGAR TWICE DAILY. DX:E11.59 Patient taking differently: 1 each by Other route daily. E11.9 02/01/20  Yes Renato Shin, MD  BD ULTRA-FINE PEN NEEDLES 29G X 12.7MM MISC USE ONCE DAILY 08/06/20  Yes Renato Shin, MD  Blood Glucose Monitoring Suppl (ACCU-CHEK GUIDE) w/Device KIT Please specify directions, refills and quantity Patient taking differently: 1 each by Other route daily. E11.9 12/23/18  Yes Renato Shin, MD  Dulaglutide (TRULICITY) 1.5 WG/9.5AO SOPN Inject 0.5 mLs (1.5 mg total) into the skin once a week. 06/18/20  Yes Renato Shin, MD  glucose  blood (ACCU-CHEK GUIDE) test strip 1 each by Other route 2 (two) times daily. E11.9 Patient taking differently: 1 each daily. E11.9 06/05/20  Yes Ellison, Sean, MD  Insulin Glargine (BASAGLAR KWIKPEN) 100 UNIT/ML Inject 10 Units into the skin every morning. And pen needles 1/day 08/21/20  Yes Ellison, Sean, MD  ketorolac (TORADOL) 10 MG tablet Take 1 tablet (10 mg total) by mouth every 6 (six) hours as needed. 08/26/20   Hall-Potvin, Brittany, PA-C  ondansetron (ZOFRAN ODT) 4 MG disintegrating tablet Take 1 tablet (4 mg total) by mouth every 8 (eight) hours as needed for nausea  or vomiting. 08/26/20   Hall-Potvin, Brittany, PA-C  fluticasone (FLONASE) 50 MCG/ACT nasal spray Place 1 spray into both nostrils daily. 07/21/18 08/26/20  Bast, Traci A, NP    Family History Family History  Problem Relation Age of Onset  . Hypertension Mother   . Cancer Mother        LUG, THROAT, & BACK  . Breast cancer Mother   . Cancer Father        THROAT  . Throat cancer Father   . Cancer Maternal Grandmother        KIDNEY  . Cancer Maternal Grandfather        COLON  . Colon cancer Maternal Grandfather 70  . Diabetes Daughter   . Stomach cancer Neg Hx     Social History Social History   Tobacco Use  . Smoking status: Never Smoker  . Smokeless tobacco: Never Used  Vaping Use  . Vaping Use: Never used  Substance Use Topics  . Alcohol use: No    Alcohol/week: 0.0 standard drinks  . Drug use: No     Allergies   Adhesive [tape]   Review of Systems Review of Systems  Respiratory: Negative for shortness of breath.   Cardiovascular: Negative for chest pain.  Gastrointestinal: Negative for abdominal pain, nausea and vomiting.  Genitourinary: Positive for flank pain and frequency. Negative for dysuria, hematuria, pelvic pain, vaginal bleeding, vaginal discharge and vaginal pain.  All other systems reviewed and are negative.    Physical Exam Triage Vital Signs ED Triage Vitals  Enc Vitals Group     BP 08/26/20 1817 (!) 131/92     Pulse Rate 08/26/20 1817 98     Resp 08/26/20 1817 20     Temp 08/26/20 1817 98.2 F (36.8 C)     Temp Source 08/26/20 1817 Oral     SpO2 08/26/20 1817 98 %     Weight --      Height --      Head Circumference --      Peak Flow --      Pain Score 08/26/20 1825 10     Pain Loc --      Pain Edu? --      Excl. in GC? --    No data found.  Updated Vital Signs BP (!) 131/92 (BP Location: Left Arm)   Pulse 98   Temp 98.2 F (36.8 C) (Oral)   Resp 20   LMP 01/03/2012   SpO2 98%   Visual Acuity Right Eye Distance:   Left  Eye Distance:   Bilateral Distance:    Right Eye Near:   Left Eye Near:    Bilateral Near:     Physical Exam Constitutional:      General: She is not in acute distress. HENT:     Head: Normocephalic and atraumatic.  Eyes:     General: No   scleral icterus.    Pupils: Pupils are equal, round, and reactive to light.  Cardiovascular:     Rate and Rhythm: Normal rate.  Pulmonary:     Effort: Pulmonary effort is normal.  Abdominal:     General: Bowel sounds are normal.     Palpations: Abdomen is soft.     Tenderness: There is no abdominal tenderness. There is right CVA tenderness. There is no left CVA tenderness or guarding.  Skin:    Coloration: Skin is not jaundiced or pale.  Neurological:     Mental Status: She is alert and oriented to person, place, and time.      UC Treatments / Results  Labs (all labs ordered are listed, but only abnormal results are displayed) Labs Reviewed  POCT URINALYSIS DIP (MANUAL ENTRY) - Abnormal; Notable for the following components:      Result Value   Glucose, UA =100 (*)    Bilirubin, UA small (*)    Spec Grav, UA >=1.030 (*)    Blood, UA trace-lysed (*)    Urobilinogen, UA 4.0 (*)    All other components within normal limits  POCT FASTING CBG [facility] MANUAL ENTRY - Abnormal; Notable for the following components:   POCT Glucose ([facility]) 111 (*)    All other components within normal limits    EKG   Radiology No results found.  Procedures Procedures (including critical care time)  Medications Ordered in UC Medications  ketorolac (TORADOL) 30 MG/ML injection 30 mg (has no administration in time range)    Initial Impression / Assessment and Plan / UC Course  I have reviewed the triage vital signs and the nursing notes.  Pertinent labs & imaging results that were available during my care of the patient were reviewed by me and considered in my medical decision making (see chart for details).     H&P concerning for right renal colic.   No history of renal calculi, though clinically suspicious for that.  Urine with abnormalities as above.  CBG 111.  We will treat supportively, push fluids, attempt to collect specimen, and follow-up with urology. ER return precautions discussed, pt verbalized understanding and is agreeable to plan. Final Clinical Impressions(s) / UC Diagnoses   Final diagnoses:  Renal colic on right side     Discharge Instructions     Drink plenty of fluids (water, sugar-free drinks) throughout the day. May take Toradol as needed for pain: No ibuprofen, Motrin, Advil, Aleve, naproxen (NSAIDs) while taking this. Zofran as needed for nausea. Use urine strainer when urinating.   If you catch a stone, please follow-up with your PCP or urology for further evaluation. Go to ER for blood in urine, worsening abdominal or back pain, vomiting, fever.    ED Prescriptions    Medication Sig Dispense Auth. Provider   ketorolac (TORADOL) 10 MG tablet Take 1 tablet (10 mg total) by mouth every 6 (six) hours as needed. 20 tablet Hall-Potvin, Brittany, PA-C   ondansetron (ZOFRAN ODT) 4 MG disintegrating tablet Take 1 tablet (4 mg total) by mouth every 8 (eight) hours as needed for nausea or vomiting. 21 tablet Hall-Potvin, Brittany, PA-C     PDMP not reviewed this encounter.   Hall-Potvin, Brittany, PA-C 08/26/20 1924  

## 2020-08-27 ENCOUNTER — Encounter (HOSPITAL_BASED_OUTPATIENT_CLINIC_OR_DEPARTMENT_OTHER): Payer: Self-pay

## 2020-08-27 ENCOUNTER — Emergency Department (HOSPITAL_BASED_OUTPATIENT_CLINIC_OR_DEPARTMENT_OTHER)
Admission: EM | Admit: 2020-08-27 | Discharge: 2020-08-27 | Disposition: A | Discharge status: Home / Self Care | Payer: 59 | Attending: Emergency Medicine | Admitting: Emergency Medicine

## 2020-08-27 ENCOUNTER — Other Ambulatory Visit: Payer: Self-pay

## 2020-08-27 ENCOUNTER — Emergency Department (HOSPITAL_BASED_OUTPATIENT_CLINIC_OR_DEPARTMENT_OTHER): Payer: 59

## 2020-08-27 DIAGNOSIS — E119 Type 2 diabetes mellitus without complications: Secondary | ICD-10-CM | POA: Insufficient documentation

## 2020-08-27 DIAGNOSIS — E039 Hypothyroidism, unspecified: Secondary | ICD-10-CM | POA: Insufficient documentation

## 2020-08-27 DIAGNOSIS — Z794 Long term (current) use of insulin: Secondary | ICD-10-CM | POA: Diagnosis not present

## 2020-08-27 DIAGNOSIS — R109 Unspecified abdominal pain: Secondary | ICD-10-CM | POA: Diagnosis present

## 2020-08-27 MED ORDER — HYDROMORPHONE HCL 1 MG/ML IJ SOLN
1.0000 mg | Freq: Once | INTRAMUSCULAR | Status: AC
  Administered 2020-08-27: 1 mg via INTRAVENOUS
  Filled 2020-08-27: qty 1

## 2020-08-27 MED ORDER — ONDANSETRON HCL 4 MG/2ML IJ SOLN
4.0000 mg | Freq: Once | INTRAMUSCULAR | Status: AC
  Administered 2020-08-27: 4 mg via INTRAVENOUS
  Filled 2020-08-27: qty 2

## 2020-08-27 MED ORDER — OXYCODONE-ACETAMINOPHEN 5-325 MG PO TABS
1.0000 | ORAL_TABLET | ORAL | 0 refills | Status: DC | PRN
Start: 2020-08-27 — End: 2020-09-04

## 2020-08-27 MED ORDER — ONDANSETRON 8 MG PO TBDP: 8.0000 mg | ORAL_TABLET | Freq: Three times a day (TID) | ORAL | 0 refills | Status: DC | PRN

## 2020-08-27 NOTE — ED Provider Notes (Signed)
[facility] Provider Note: Georgena Spurling, MD, FACEP  CSN: [id number] MRN: [id number] ARRIVAL: 08/27/20 at [place]: [place]  Flank Pain   HISTORY OF PRESENT ILLNESS  08/27/20 5:49 AM [name] is a 54 y.o. female with right flank pain for the past 3 days. The onset was gradual and it has become severe (10 out of 10). The pain is different than any pain she has had in the past. It is not significantly worse with movement. She was seen in a [facility] urgent care yesterday and diagnosed with suspected renal colic but no imaging was done. She was treated with Toradol and Zofran orally. She returns with inadequately controlled pain. She denies hematuria, dysuria, fever, chills, nausea or vomiting.   Past Medical History:  Diagnosis Date  . Acute meniscal tear of left knee   . Diabetes mellitus without complication ([place])   . History of hypothyroidism   . Prediabetes 09/20/2017   Elevated blood glucose since 2013 possibly prior.    Past Surgical History:  Procedure Laterality Date  . [place] ENDOMETRIAL ABLATION  03-16-2007  . KNEE ARTHROSCOPY Right   . KNEE ARTHROSCOPY WITH MEDIAL MENISECTOMY Left 05/17/2015   Procedure: LEFT KNEE ARTHROSCOPY WITH MEDIAL MENISECTOMY, CHONDROPLASTY PATELLA;  Surgeon: Latanya Maudlin, MD;  Location: [facility];  Service: Orthopedics;  Laterality: Left;  . LAPAROSOCPY BILATERAL TUBAL LIGATION W/ CAUTERIZATION  10-15-2000  . VAGINAL HYSTERECTOMY  01/25/2012   Procedure: HYSTERECTOMY VAGINAL;  Surgeon: Anastasio Auerbach, MD;  Location: [facility];  Service: Gynecology;  Laterality: N/A;    Family History  Problem Relation Age of Onset  . Hypertension Mother   . Cancer Mother        LUG, THROAT, & BACK  . Breast cancer Mother   . Cancer Father        THROAT  . Throat cancer Father   . Cancer Maternal Grandmother        KIDNEY  . Cancer Maternal Grandfather        COLON  .  Colon cancer Maternal Grandfather 22  . Diabetes Daughter   . Stomach cancer Neg Hx     Social History   Tobacco Use  . Smoking status: Never Smoker  . Smokeless tobacco: Never Used  Vaping Use  . Vaping Use: Never used  Substance Use Topics  . Alcohol use: No    Alcohol/week: 0.0 standard drinks  . Drug use: No    Prior to Admission medications   Medication Sig Start Date End Date Taking? Authorizing Provider  Accu-Chek FastClix Lancets MISC USE FASTLIX LANCETS AS INSTRUCTED TO CHECK BLOOD SUGAR TWICE DAILY. DX:E11.59 Patient taking differently: 1 each by Other route daily. E11.9 02/01/20   Renato Shin, MD  BD ULTRA-FINE PEN NEEDLES 29G X 12.7MM MISC USE ONCE DAILY 08/06/20   Renato Shin, MD  Blood Glucose Monitoring Suppl (ACCU-CHEK GUIDE) w/Device KIT Please specify directions, refills and quantity Patient taking differently: 1 each by Other route daily. E11.9 12/23/18   Renato Shin, MD  Dulaglutide (TRULICITY) 1.5 IR/5.1OA SOPN Inject 0.5 mLs (1.5 mg total) into the skin once a week. 06/18/20   Renato Shin, MD  glucose blood (ACCU-CHEK GUIDE) test strip 1 each by Other route 2 (two) times daily. E11.9 Patient taking differently: 1 each daily. E11.9 06/05/20   Renato Shin, MD  Insulin Glargine [facility]) 100 UNIT/ML Inject 10 Units into the skin every morning. And pen needles  1/day 08/21/20   Renato Shin, MD  ketorolac (TORADOL) 10 MG tablet Take 1 tablet (10 mg total) by mouth every 6 (six) hours as needed. 08/26/20   Hall-Potvin, [place], PA-C  ondansetron (ZOFRAN ODT) 4 MG disintegrating tablet Take 1 tablet (4 mg total) by mouth every 8 (eight) hours as needed for nausea or vomiting. 08/26/20   Hall-Potvin, [place], PA-C  ondansetron (ZOFRAN ODT) 8 MG disintegrating tablet Take 1 tablet (8 mg total) by mouth every 8 (eight) hours as needed for nausea or vomiting. 08/27/20   Molpus, John, MD  oxyCODONE-acetaminophen (PERCOCET) 5-325 MG tablet Take 1 tablet by mouth  every 4 (four) hours as needed for severe pain. 08/27/20   Molpus, John, MD  fluticasone (FLONASE) 50 MCG/ACT nasal spray Place 1 spray into both nostrils daily. 07/21/18 08/26/20  Loura Halt A, NP    Allergies Adhesive [tape]   REVIEW OF SYSTEMS  Negative except as noted here or in the History of Present Illness.   PHYSICAL EXAMINATION  Initial Vital Signs Blood pressure (!) 142/95, pulse 72, temperature 97.7 F (36.5 C), temperature source Oral, resp. rate 16, height 5' 8" (1.727 m), weight 98.4 kg, last menstrual period 01/03/2012, SpO2 99 %.  Examination General: Well-developed, well-nourished female in no acute distress; appearance consistent with age of record HENT: normocephalic; atraumatic Eyes: pupils equal, round and reactive to light; extraocular muscles intact Neck: supple Heart: regular rate and rhythm Lungs: clear to auscultation bilaterally Abdomen: soft; nondistended; nontender; bowel sounds present GU: Right CVA tenderness Extremities: No deformity; full range of motion; pulses normal Neurologic: Awake, alert and oriented; motor function intact in all extremities and symmetric; no facial droop Skin: Warm and dry Psychiatric: Tearful   RESULTS  Summary of this visit's results, reviewed and interpreted by myself:   EKG Interpretation  Date/Time:    Ventricular Rate:    PR Interval:    QRS Duration:   QT Interval:    QTC Calculation:   R Axis:     Text Interpretation:        Laboratory Studies: Results for orders placed or performed during the hospital encounter of 08/26/20 (from the past 24 hour(s))  POCT urinalysis dipstick     Status: Abnormal   Collection Time: 08/26/20  6:45 PM  Result Value Ref Range   Color, UA yellow yellow   Clarity, UA clear clear   Glucose, UA =100 (A) negative mg/dL   Bilirubin, UA small (A) negative   Ketones, POC UA negative negative mg/dL   Spec Grav, UA >=1.030 (A) 1.010 - 1.025   Blood, UA trace-lysed (A)  negative   pH, UA 5.5 5.0 - 8.0   Protein Ur, POC negative negative mg/dL   Urobilinogen, UA 4.0 (A) 0.2 or 1.0 E.U./dL   Nitrite, UA Negative Negative   Leukocytes, UA Negative Negative  POCT CBG (manual entry)     Status: Abnormal   Collection Time: 08/26/20  7:15 PM  Result Value Ref Range   POCT Glucose ([facility]) 111 (A) 70 - 99 mg/dL   Imaging Studies: CT Renal Stone Study  Result Date: 08/27/2020 CLINICAL DATA:  Flank pain with kidney stone suspected EXAM: CT ABDOMEN AND PELVIS WITHOUT CONTRAST TECHNIQUE: Multidetector CT imaging of the abdomen and pelvis was performed following the standard protocol without IV contrast. COMPARISON:  None. FINDINGS: Lower chest:  Mild atelectatic type density at the bases Hepatobiliary: Hepatic steatosis with central sparing. No evidence of liver lesion.No evidence of biliary obstruction or stone. Pancreas:  Unremarkable. Spleen: Unremarkable. Adrenals/Urinary Tract: Negative adrenals. No hydronephrosis or stone. Unremarkable bladder. Stomach/Bowel:  No obstruction. No appendicitis. Vascular/Lymphatic: No acute vascular abnormality. No mass or adenopathy. Reproductive:No pathologic findings. Other: No ascites or pneumoperitoneum. Musculoskeletal: No acute abnormalities. L4-5 facet osteoarthritis with mild anterolisthesis. IMPRESSION: 1. No acute finding.  No hydronephrosis or nephrolithiasis. 2. Hepatic steatosis. Electronically Signed   By: Monte Fantasia M.D.   On: 08/27/2020 06:50    ED COURSE and MDM  Nursing notes, initial and subsequent vitals signs, including pulse oximetry, reviewed and interpreted by myself.  Vitals:   08/27/20 0547 08/27/20 0548  BP: (!) 142/95   Pulse: 72   Resp: 16   Temp: 97.7 F (36.5 C)   TempSrc: Oral   SpO2: 99%   Weight:  98.4 kg  Height:  5' 8" (1.727 m)   Medications  ondansetron (ZOFRAN) injection 4 mg (4 mg Intravenous Given 08/27/20 0600)  HYDROmorphone (DILAUDID) injection 1 mg (1 mg Intravenous Given  08/27/20 0559)   6:31 AM Pain significantly improved patient somnolent after IV Dilaudid and Zofran.   6:54 AM The patient's presentation is consistent with a kidney stone.  Although the radiologist did not note a kidney stone I do see a lucency on her CT that is consistent with a ureteral stone but without hydroureter or hydronephrosis.  Alternatively she could have musculoskeletal or radicular flank pain.   PROCEDURES  Procedures   ED DIAGNOSES     ICD-10-CM   1. Right flank pain  R10.9        Molpus, John, MD 08/27/20 0700

## 2020-08-29 ENCOUNTER — Ambulatory Visit: Payer: Self-pay

## 2020-09-04 ENCOUNTER — Other Ambulatory Visit: Payer: Self-pay

## 2020-09-04 ENCOUNTER — Ambulatory Visit (INDEPENDENT_AMBULATORY_CARE_PROVIDER_SITE_OTHER): Payer: 59 | Admitting: Urology

## 2020-09-04 VITALS — BP 136/94 | HR 83 | Wt 217.0 lb

## 2020-09-04 DIAGNOSIS — R109 Unspecified abdominal pain: Secondary | ICD-10-CM | POA: Diagnosis not present

## 2020-09-04 NOTE — Progress Notes (Signed)
09/04/2020 10:20 AM   [name] 1966/01/24 [id number]  Referring provider: No referring provider defined for this encounter.  Chief Complaint  Patient presents with  . Nephrolithiasis    HPI: 54 year old female who presents today for further evaluation of right flank pain.  She was seen and evaluated in urgent care on November 1 and later in the emergency room on November 2.  Initially, her symptoms are felt to be related to possible kidney stone event and thus she ended up being seen at her higher acuity location where CT scan could be performed.  CT scan indicated no hydroureteronephrosis or stones.  She does not have a personal history of stones.  Urinalysis at urgent care showed trace lysed blood cells on dip, no microscopic was performed.  Urine was otherwise unremarkable.  She denies any urinary symptoms.  She does report that her pain was primarily in the right flank.,  Came in with sudden onset was severe in nature.  She felt like she pulled a muscle.  Its improving with time.   PMH: Past Medical History:  Diagnosis Date  . Acute meniscal tear of left knee   . Diabetes mellitus without complication ([place])   . History of hypothyroidism   . Prediabetes 09/20/2017   Elevated blood glucose since 2013 possibly prior.    Surgical History: Past Surgical History:  Procedure Laterality Date  . [place] ENDOMETRIAL ABLATION  03-16-2007  . KNEE ARTHROSCOPY Right   . KNEE ARTHROSCOPY WITH MEDIAL MENISECTOMY Left 05/17/2015   Procedure: LEFT KNEE ARTHROSCOPY WITH MEDIAL MENISECTOMY, CHONDROPLASTY PATELLA;  Surgeon: Latanya Maudlin, MD;  Location: [facility];  Service: Orthopedics;  Laterality: Left;  . LAPAROSOCPY BILATERAL TUBAL LIGATION W/ CAUTERIZATION  10-15-2000  . VAGINAL HYSTERECTOMY  01/25/2012   Procedure: HYSTERECTOMY VAGINAL;  Surgeon: Anastasio Auerbach, MD;  Location: [facility];  Service: Gynecology;  Laterality: N/A;     Home Medications:  Allergies as of 09/04/2020      Reactions   Adhesive [tape] Rash      Medication List       Accurate as of September 04, 2020 10:20 AM. If you have any questions, ask your nurse or doctor.        STOP taking these medications   ketorolac 10 MG tablet Commonly known as: TORADOL Stopped by: Hollice Espy, MD   ondansetron 4 MG disintegrating tablet Commonly known as: Zofran ODT Stopped by: Hollice Espy, MD   ondansetron 8 MG disintegrating tablet Commonly known as: Zofran ODT Stopped by: Hollice Espy, MD   oxyCODONE-acetaminophen 5-325 MG tablet Commonly known as: Percocet Stopped by: Hollice Espy, MD     TAKE these medications   Accu-Chek FastClix Lancets Misc USE FASTLIX LANCETS AS INSTRUCTED TO CHECK BLOOD SUGAR TWICE DAILY. DX:E11.59 What changed: See the new instructions.   Accu-Chek Guide test strip Generic drug: glucose blood 1 each by Other route 2 (two) times daily. E11.9 What changed:   how to take this  when to take this   Accu-Chek Guide w/Device Kit Please specify directions, refills and quantity What changed: See the new instructions.   Basaglar KwikPen 100 UNIT/ML Inject 10 Units into the skin every morning. And pen needles 1/day   BD ULTRA-FINE PEN NEEDLES 29G X 12.7MM Misc Generic drug: Insulin Pen Needle USE ONCE DAILY   doxycycline 100 MG capsule Commonly known as: VIBRAMYCIN Take 100 mg by mouth 2 (two) times daily.   Trulicity 1.5 IO/0.3TD Sopn Generic drug:  Dulaglutide Inject 0.5 mLs (1.5 mg total) into the skin once a week.       Allergies:  Allergies  Allergen Reactions  . Adhesive [Tape] Rash    Family History: Family History  Problem Relation Age of Onset  . Hypertension Mother   . Cancer Mother        LUG, THROAT, & BACK  . Breast cancer Mother   . Cancer Father        THROAT  . Throat cancer Father   . Cancer Maternal Grandmother        KIDNEY  . Cancer Maternal Grandfather         COLON  . Colon cancer Maternal Grandfather 56  . Diabetes Daughter   . Stomach cancer Neg Hx     Social History:  reports that she has never smoked. She has never used smokeless tobacco. She reports that she does not drink alcohol and does not use drugs.   Physical Exam: BP (!) 136/94   Pulse 83   Wt 217 lb (98.4 kg)   LMP 01/03/2012   BMI 32.99 kg/m   Constitutional:  Alert and oriented, No acute distress. HEENT: [place] AT, moist mucus membranes.  Trachea midline, no masses. Cardiovascular: No clubbing, cyanosis, or edema. Respiratory: Normal respiratory effort, no increased work of breathing. Skin: No rashes, bruises or suspicious lesions. Neurologic: Grossly intact, no focal deficits, moving all 4 extremities. Psychiatric: Normal mood and affect.  Laboratory Data: Lab Results  Component Value Date   WBC 4.6 10/12/2017   HGB 13.4 10/12/2017   HCT 39.8 10/12/2017   MCV 85 10/12/2017   PLT 261 10/12/2017    Lab Results  Component Value Date   CREATININE 0.90 08/28/2019    Lab Results  Component Value Date   HGBA1C 6.3 (A) 06/18/2020    Urinalysis UA today is negative  Pertinent Imaging: CT Renal Stone Study  Narrative CLINICAL DATA:  Flank pain with kidney stone suspected  EXAM: CT ABDOMEN AND PELVIS WITHOUT CONTRAST  TECHNIQUE: Multidetector CT imaging of the abdomen and pelvis was performed following the standard protocol without IV contrast.  COMPARISON:  None.  FINDINGS: Lower chest:  Mild atelectatic type density at the bases  Hepatobiliary: Hepatic steatosis with central sparing. No evidence of liver lesion.No evidence of biliary obstruction or stone.  Pancreas: Unremarkable.  Spleen: Unremarkable.  Adrenals/Urinary Tract: Negative adrenals. No hydronephrosis or stone. Unremarkable bladder.  Stomach/Bowel:  No obstruction. No appendicitis.  Vascular/Lymphatic: No acute vascular abnormality. No mass  or adenopathy.  Reproductive:No pathologic findings.  Other: No ascites or pneumoperitoneum.  Musculoskeletal: No acute abnormalities. L4-5 facet osteoarthritis with mild anterolisthesis.  IMPRESSION: 1. No acute finding.  No hydronephrosis or nephrolithiasis. 2. Hepatic steatosis.   Electronically Signed By: Monte Fantasia M.D. On: 08/27/2020 06:50  CT scan was personally reviewed.  Agree with radiologic interpretation.  There are no ureteral or renal calculi or evidence of recently passed stone on the CT scan.  There is a phlebolith in the right hemipelvis.  Assessment & Plan:    1. Right flank pain Based on a normal urinalysis and a negative CT scan, I do not feel that her flank pain was ever related to a renal stone event  Suspect MSK etiology, improving  Patient was reassured.  Follow-up as needed - Urinalysis, Complete   Hollice Espy, MD  [facility] [address], [place], [place] [address] [phone]

## 2020-09-05 LAB — URINALYSIS, COMPLETE
Bilirubin, UA: NEGATIVE
Glucose, UA: NEGATIVE
Ketones, UA: NEGATIVE
Leukocytes,UA: NEGATIVE
Nitrite, UA: NEGATIVE
Protein,UA: NEGATIVE
Specific Gravity, UA: 1.015 (ref 1.005–1.030)
Urobilinogen, Ur: 0.2 mg/dL (ref 0.2–1.0)
pH, UA: 5.5 (ref 5.0–7.5)

## 2020-09-05 LAB — MICROSCOPIC EXAMINATION
Bacteria, UA: NONE SEEN
Epithelial Cells (non renal): >10 /hpf — AB (ref 0–10)

## 2020-09-09 ENCOUNTER — Other Ambulatory Visit: Payer: Self-pay | Admitting: Endocrinology

## 2020-09-09 DIAGNOSIS — Z794 Long term (current) use of insulin: Secondary | ICD-10-CM

## 2020-09-25 ENCOUNTER — Ambulatory Visit: Payer: 59 | Admitting: Endocrinology

## 2020-10-04 ENCOUNTER — Encounter: Payer: Self-pay | Admitting: Endocrinology

## 2020-10-04 ENCOUNTER — Ambulatory Visit: Payer: 59 | Admitting: Endocrinology

## 2020-10-04 ENCOUNTER — Other Ambulatory Visit: Payer: Self-pay

## 2020-10-04 VITALS — BP 132/89 | HR 68 | Wt 213.8 lb

## 2020-10-04 DIAGNOSIS — E119 Type 2 diabetes mellitus without complications: Secondary | ICD-10-CM | POA: Diagnosis not present

## 2020-10-04 DIAGNOSIS — Z794 Long term (current) use of insulin: Secondary | ICD-10-CM | POA: Diagnosis not present

## 2020-10-04 LAB — POCT GLYCOSYLATED HEMOGLOBIN (HGB A1C): Hemoglobin A1C: 6.2 % — AB (ref 4.0–5.6)

## 2020-10-04 NOTE — Progress Notes (Signed)
Subjective:    Patient ID: [name], female    DOB: [birth year], 54 y.o.   MRN: [id number]  HPI Pt returns for f/u of diabetes mellitus:  DM type: Insulin-requiring type 2.   Dx'ed: 5208 Complications: none Therapy: insulin since 0223, and Trulicity GDM: never DKA: never Severe hypoglycemia: never Pancreatitis: never Pancreatic imaging: never Other: due to h/o noncompliance, she is not a candidate for multiple daily injections; she did not tolerate metformin (back pain).   Interval history: no cbg record, but states fasting cbg's vary from 90-135.  pt states she feels well in general.  Pt says she never misses meds.   Past Medical History:  Diagnosis Date  . Acute meniscal tear of left knee   . Diabetes mellitus without complication ([place])   . History of hypothyroidism   . Prediabetes 09/20/2017   Elevated blood glucose since 2013 possibly prior.    Past Surgical History:  Procedure Laterality Date  . [place] ENDOMETRIAL ABLATION  03-16-2007  . KNEE ARTHROSCOPY Right   . KNEE ARTHROSCOPY WITH MEDIAL MENISECTOMY Left 05/17/2015   Procedure: LEFT KNEE ARTHROSCOPY WITH MEDIAL MENISECTOMY, CHONDROPLASTY PATELLA;  Surgeon: Latanya Maudlin, MD;  Location: [facility];  Service: Orthopedics;  Laterality: Left;  . LAPAROSOCPY BILATERAL TUBAL LIGATION W/ CAUTERIZATION  10-15-2000  . VAGINAL HYSTERECTOMY  01/25/2012   Procedure: HYSTERECTOMY VAGINAL;  Surgeon: Anastasio Auerbach, MD;  Location: [facility];  Service: Gynecology;  Laterality: N/A;    Social History   Socioeconomic History  . Marital status: Married    Spouse name: Not on file  . Number of children: Not on file  . Years of education: Not on file  . Highest education level: Not on file  Occupational History  . Not on file  Tobacco Use  . Smoking status: Never Smoker  . Smokeless tobacco: Never Used  Vaping Use  . Vaping Use: Never used  Substance and Sexual Activity  .  Alcohol use: No    Alcohol/week: 0.0 standard drinks  . Drug use: No  . Sexual activity: Not Currently    Comment: DES NEG,DECLINED INSURANCE QUESTIONS  Other Topics Concern  . Not on file  Social History Narrative  . Not on file   Social Determinants of Health   Financial Resource Strain: Not on file  Food Insecurity: Not on file  Transportation Needs: Not on file  Physical Activity: Not on file  Stress: Not on file  Social Connections: Not on file  Intimate Partner Violence: Not on file    Current Outpatient Medications on File Prior to Visit  Medication Sig Dispense Refill  . Accu-Chek FastClix Lancets MISC USE FASTLIX LANCETS AS INSTRUCTED TO CHECK BLOOD SUGAR TWICE DAILY. DX:E11.59 (Patient taking differently: 1 each by Other route daily. E11.9) 102 each 3  . ACCU-CHEK GUIDE test strip USE 1 EACH BY OTHER ROUTE 2 (TWO) TIMES DAILY. E11.9 200 strip 0  . BD ULTRA-FINE PEN NEEDLES 29G X 12.7MM MISC USE ONCE DAILY 100 each 0  . Blood Glucose Monitoring Suppl (ACCU-CHEK GUIDE) w/Device KIT Please specify directions, refills and quantity (Patient taking differently: 1 each by Other route daily. E11.9) 1 kit 0  . Dulaglutide (TRULICITY) 1.5 VK/1.2AE SOPN Inject 0.5 mLs (1.5 mg total) into the skin once a week. 6 mL 3  . [DISCONTINUED] fluticasone (FLONASE) 50 MCG/ACT nasal spray Place 1 spray into both nostrils daily. 16 g 2   No current facility-administered medications on file prior to  visit.    Allergies  Allergen Reactions  . Adhesive [Tape] Rash    Family History  Problem Relation Age of Onset  . Hypertension Mother   . Cancer Mother        LUG, THROAT, & BACK  . Breast cancer Mother   . Cancer Father        THROAT  . Throat cancer Father   . Cancer Maternal Grandmother        KIDNEY  . Cancer Maternal Grandfather        COLON  . Colon cancer Maternal Grandfather 1  . Diabetes Daughter   . Stomach cancer Neg Hx     BP 132/89 (BP Location: Left Arm, Patient  Position: Sitting, Cuff Size: Large)   Pulse 68   Wt 213 lb 12.8 oz (97 kg)   LMP 01/03/2012   SpO2 93%   BMI 32.51 kg/m    Review of Systems     Objective:   Physical Exam VITAL SIGNS:  See vs page GENERAL: no distress Pulses: dorsalis pedis intact bilat.   MSK: no deformity of the feet CV: no leg edema Skin:  no ulcer on the feet.  normal color and temp on the feet. Neuro: sensation is intact to touch on the feet  A1c=6.2%     Assessment & Plan:  Type 2 DM: overcontrolled, for this insulin regimen.  Patient Instructions  check your blood sugar twice a day.  vary the time of day when you check, between before the 3 meals, and at bedtime.  also check if you have symptoms of your blood sugar being too high or too low.  please keep a record of the readings and bring it to your next appointment here (or you can bring the meter itself).  You can write it on any piece of paper.  please call us sooner if your blood sugar goes below 70, or if you have a lot of readings over 200.   Please continue the same Trulicity, and: Please stop taking the insulin. Please come back for a follow-up appointment in 3 months.

## 2020-10-04 NOTE — Patient Instructions (Addendum)
check your blood sugar twice a day.  vary the time of day when you check, between before the 3 meals, and at bedtime.  also check if you have symptoms of your blood sugar being too high or too low.  please keep a record of the readings and bring it to your next appointment here (or you can bring the meter itself).  You can write it on any piece of paper.  please call us sooner if your blood sugar goes below 70, or if you have a lot of readings over 200.   Please continue the same Trulicity, and: Please stop taking the insulin. Please come back for a follow-up appointment in 3 months.

## 2020-11-25 ENCOUNTER — Other Ambulatory Visit: Payer: Self-pay | Admitting: Endocrinology

## 2020-11-25 DIAGNOSIS — Z794 Long term (current) use of insulin: Secondary | ICD-10-CM

## 2020-11-25 DIAGNOSIS — E119 Type 2 diabetes mellitus without complications: Secondary | ICD-10-CM

## 2020-11-29 ENCOUNTER — Other Ambulatory Visit: Payer: Self-pay | Admitting: Endocrinology

## 2020-12-17 ENCOUNTER — Encounter: Payer: Self-pay | Admitting: Nurse Practitioner

## 2020-12-17 ENCOUNTER — Ambulatory Visit: Payer: 59 | Admitting: Nurse Practitioner

## 2020-12-17 ENCOUNTER — Other Ambulatory Visit: Payer: Self-pay

## 2020-12-17 VITALS — BP 138/88 | HR 80 | Ht 68.0 in | Wt 215.0 lb

## 2020-12-17 DIAGNOSIS — Z90710 Acquired absence of both cervix and uterus: Secondary | ICD-10-CM | POA: Diagnosis not present

## 2020-12-17 DIAGNOSIS — Z01419 Encounter for gynecological examination (general) (routine) without abnormal findings: Secondary | ICD-10-CM

## 2020-12-17 DIAGNOSIS — R232 Flushing: Secondary | ICD-10-CM | POA: Diagnosis not present

## 2020-12-17 NOTE — Progress Notes (Signed)
   [name] 1966/04/16 [id number]   History:  55 y.o. [id number] presents for annual exam without GYN complaints. 2013 TVH for dysmenorrhea and menorrhagia.  She is starting to have mild menopausal symptoms of hot flashes and mood changes. T2DM managed by endocrinology with improvement of A1C from 13 to 6, no longer on insulin. She has an appt with a new PCP in March. Normal pap and mammogram history.   Gynecologic History Patient's last menstrual period was 01/03/2012.   Contraception/Family planning: status post hysterectomy  Health Maintenance Last Pap: No longer screening per guidelines Last mammogram: 7//2021. Results were: normal Last colonoscopy: 2018. Results were: normal, 10-year recall Last Dexa: Never  Past medical history, past surgical history, family history and social history were all reviewed and documented in the EPIC chart.  ROS:  A ROS was performed and pertinent positives and negatives are included.  Exam:  Vitals:   12/17/20 1112  BP: 138/88  Pulse: 80  Weight: 215 lb (97.5 kg)  Height: 5\' 8"  (1.727 m)   Body mass index is 32.69 kg/m.  General appearance:  Normal Thyroid:  Symmetrical, normal in size, without palpable masses or nodularity. Respiratory  Auscultation:  Clear without wheezing or rhonchi Cardiovascular  Auscultation:  Regular rate, without rubs, murmurs or gallops  Edema/varicosities:  Not grossly evident Abdominal  Soft,nontender, without masses, guarding or rebound.  Liver/spleen:  No organomegaly noted  Hernia:  None appreciated  Skin  Inspection:  Grossly normal   Breasts: Examined lying and sitting.   Right: Without masses, retractions, discharge or axillary adenopathy.   Left: Without masses, retractions, discharge or axillary adenopathy. Gentitourinary   Inguinal/mons:  Normal without inguinal adenopathy  External genitalia:  Normal  BUS/Urethra/Skene's glands:  Normal  Vagina:  Normal  Cervix:  Absent  Uterus:   Absent  Adnexa/parametria:     Rt: Without masses or tenderness.   Lt: Without masses or tenderness.  Anus and perineum: Normal  Digital rectal exam: Normal sphincter tone without palpated masses or tenderness  Assessment/Plan:  55 y.o. [id number] for annual exam.   Well female exam with routine gynecological exam - Education provided on SBEs, importance of preventative screenings, current guidelines, high calcium diet, regular exercise, and multivitamin daily. Labs with PCP and endocrinology.   History of total vaginal hysterectomy (TVH) - 2013-dysmenorrhea, menorrhagia  Hot flashes - provided her with OTC management options  Screening for cervical cancer - Normal Pap history.  No longer screening per guidelines.   Screening for breast cancer - Normal mammogram history.  Continue annual screenings.  Normal breast exam today.  Screening for colon cancer - Normal colonoscopy in 2018. Will repeat at GI's recommended interval.   Follow up in 1 year for annual.    Tamela Gammon [facility], 11:27 AM 12/17/2020

## 2020-12-17 NOTE — Patient Instructions (Addendum)
Ginseng, [place], Black Cohosh, OR [place] Maintenance, Female Adopting a healthy lifestyle and getting preventive care are important in promoting health and wellness. Ask your health care provider about:  The right schedule for you to have regular tests and exams.  Things you can do on your own to prevent diseases and keep yourself healthy. What should I know about diet, weight, and exercise? Eat a healthy diet  Eat a diet that includes plenty of vegetables, fruits, low-fat dairy products, and lean protein.  Do not eat a lot of foods that are high in solid fats, added sugars, or sodium.   Maintain a healthy weight Body mass index (BMI) is used to identify weight problems. It estimates body fat based on height and weight. Your health care provider can help determine your BMI and help you achieve or maintain a healthy weight. Get regular exercise Get regular exercise. This is one of the most important things you can do for your health. Most adults should:  Exercise for at least 150 minutes each week. The exercise should increase your heart rate and make you sweat (moderate-intensity exercise).  Do strengthening exercises at least twice a week. This is in addition to the moderate-intensity exercise.  Spend less time sitting. Even light physical activity can be beneficial. Watch cholesterol and blood lipids Have your blood tested for lipids and cholesterol at 54 years of age, then have this test every 5 years. Have your cholesterol levels checked more often if:  Your lipid or cholesterol levels are high.  You are older than 54 years of age.  You are at high risk for heart disease. What should I know about cancer screening? Depending on your health history and family history, you may need to have cancer screening at various ages. This may include screening for:  Breast cancer.  Cervical cancer.  Colorectal cancer.  Skin cancer.  Lung cancer. What should I know about  heart disease, diabetes, and high blood pressure? Blood pressure and heart disease  High blood pressure causes heart disease and increases the risk of stroke. This is more likely to develop in people who have high blood pressure readings, are of African descent, or are overweight.  Have your blood pressure checked: ? Every 3-5 years if you are 54-34 years of age. ? Every year if you are 41 years old or older. Diabetes Have regular diabetes screenings. This checks your fasting blood sugar level. Have the screening done:  Once every three years after age 49 if you are at a normal weight and have a low risk for diabetes.  More often and at a younger age if you are overweight or have a high risk for diabetes. What should I know about preventing infection? Hepatitis B If you have a higher risk for hepatitis B, you should be screened for this virus. Talk with your health care provider to find out if you are at risk for hepatitis B infection. Hepatitis C Testing is recommended for:  Everyone born from [birth year] through 1964.  Anyone with known risk factors for hepatitis C. Sexually transmitted infections (STIs)  Get screened for STIs, including gonorrhea and chlamydia, if: ? You are sexually active and are younger than 54 years of age. ? You are older than 54 years of age and your health care provider tells you that you are at risk for this type of infection. ? Your sexual activity has changed since you were last screened, and you are at increased risk for chlamydia  or gonorrhea. Ask your health care provider if you are at risk.  Ask your health care provider about whether you are at high risk for HIV. Your health care provider may recommend a prescription medicine to help prevent HIV infection. If you choose to take medicine to prevent HIV, you should first get tested for HIV. You should then be tested every 3 months for as long as you are taking the medicine. Pregnancy  If you are about to  stop having your period (premenopausal) and you may become pregnant, seek counseling before you get pregnant.  Take 400 to 800 micrograms (mcg) of folic acid every day if you become pregnant.  Ask for birth control (contraception) if you want to prevent pregnancy. Osteoporosis and menopause Osteoporosis is a disease in which the bones lose minerals and strength with aging. This can result in bone fractures. If you are 67 years old or older, or if you are at risk for osteoporosis and fractures, ask your health care provider if you should:  Be screened for bone loss.  Take a calcium or vitamin D supplement to lower your risk of fractures.  Be given hormone replacement therapy (HRT) to treat symptoms of menopause. Follow these instructions at home: Lifestyle  Do not use any products that contain nicotine or tobacco, such as cigarettes, e-cigarettes, and chewing tobacco. If you need help quitting, ask your health care provider.  Do not use street drugs.  Do not share needles.  Ask your health care provider for help if you need support or information about quitting drugs. Alcohol use  Do not drink alcohol if: ? Your health care provider tells you not to drink. ? You are pregnant, may be pregnant, or are planning to become pregnant.  If you drink alcohol: ? Limit how much you use to 0-1 drink a day. ? Limit intake if you are breastfeeding.  Be aware of how much alcohol is in your drink. In the [place] one drink equals one 12 oz bottle of beer (355 mL), one 5 oz glass of wine (148 mL), or one 1 oz glass of hard liquor (44 mL). General instructions  Schedule regular health, dental, and eye exams.  Stay current with your vaccines.  Tell your health care provider if: ? You often feel depressed. ? You have ever been abused or do not feel safe at home. Summary  Adopting a healthy lifestyle and getting preventive care are important in promoting health and wellness.  Follow your  health care provider's instructions about healthy diet, exercising, and getting tested or screened for diseases.  Follow your health care provider's instructions on monitoring your cholesterol and blood pressure. This information is not intended to replace advice given to you by your health care provider. Make sure you discuss any questions you have with your health care provider. Document Revised: 10/05/2018 Document Reviewed: 10/05/2018 Elsevier Patient Education  2021 [employer].

## 2021-01-02 ENCOUNTER — Ambulatory Visit: Payer: 59 | Admitting: Endocrinology

## 2021-01-02 NOTE — Progress Notes (Signed)
Subjective:    [name] - 55 y.o. female MRN [id number]  Date of birth: 01/11/1966  HPI  [name] is to establish care. Patient has a PMH significant for hypertension associated with diabetes, hypothyroidism, and vitamin D deficiency..   Current issues and/or concerns: None  ROS per HPI   Health Maintenance:  Health Maintenance Due  Topic Date Due  . Hepatitis C Screening  Never done  . PNEUMOCOCCAL POLYSACCHARIDE VACCINE AGE 13-64 HIGH RISK  Never done  . COVID-19 Vaccine (1) Never done  . URINE MICROALBUMIN  Never done  . HIV Screening  Never done  . TETANUS/TDAP  Never done  . PAP SMEAR-Modifier  11/25/2014    Past Medical History: Patient Active Problem List   Diagnosis Date Noted  . Chronic midline low back pain without sciatica 02/13/2018  . Mixed diabetic hyperlipidemia associated with type 2 diabetes mellitus ([place]) 10/25/2017  . Hypertension associated with diabetes ([place]) 10/25/2017  . Diabetes mellitus, type II ([place]) 10/25/2017  . H/O total hysterectomy- benign reasons 10-07-2017  . Left knee pain- Dr Amedeo Plenty 07-Oct-2017  . Perimenopausal 07-Oct-2017  . Family history of breast cancer in mother-deceased late January 31, 2023 10/07/17  . Family history of colon cancer- MGF- in 60's Oct 07, 2017  . Blood glucose elevated 10-07-2017  . Elevated alanine aminotransferase (ALT) level 10-07-17  . Low HDL (under 40) 07-Oct-2017  . Severe obesity (BMI 35.0-35.9 with comorbidity) ([place]) 2017/10/07  . Vitamin D deficiency 07-Oct-2017  . Family history of diabetes mellitus (DM)- in daughter 10/07/2017  . Hypothyroidism 02/08/2013     Social History   reports that she has never smoked. She has never used smokeless tobacco. She reports that she does not drink alcohol and does not use drugs.   Family History  family history includes Breast cancer in her mother; Cancer in her father, maternal grandfather, maternal grandmother, and mother; Colon cancer (age of onset: 16) in  her maternal grandfather; Diabetes in her daughter; Hypertension in her mother; Throat cancer in her father.   Medications: reviewed and updated   Objective:   Physical Exam BP (!) 143/94 (BP Location: Left Arm, Patient Position: Sitting)   Pulse 97   Ht 5' 7.52" (1.715 m)   Wt 213 lb 3.2 oz (96.7 kg)   LMP 01/03/2012   SpO2 99%   BMI 32.88 kg/m  Physical Exam HENT:     Head: Normocephalic and atraumatic.  Eyes:     Conjunctiva/sclera: Conjunctivae normal.     Pupils: Pupils are equal, round, and reactive to light.  Cardiovascular:     Rate and Rhythm: Normal rate and regular rhythm.     Pulses: Normal pulses.     Heart sounds: Normal heart sounds.  Pulmonary:     Effort: Pulmonary effort is normal.     Breath sounds: Normal breath sounds.  Musculoskeletal:     Cervical back: Normal range of motion and neck supple.  Neurological:     General: No focal deficit present.     Mental Status: She is alert and oriented to person, place, and time.  Psychiatric:        Mood and Affect: Mood normal.        Behavior: Behavior normal.      Assessment & Plan:  1. Encounter to establish care: - Patient presents today to establish care.  - Return for annual physical examination, labs, and health maintenance. Arrive fasting meaning having had no food and/or nothing to drink for at least  8 hours prior to appointment.  Please take scheduled medications as normal.   Patient was given clear instructions to go to Emergency Department or return to medical center if symptoms don't improve, worsen, or new problems develop.The patient verbalized understanding.  I discussed the assessment and treatment plan with the patient. The patient was provided an opportunity to ask questions and all were answered. The patient agreed with the plan and demonstrated an understanding of the instructions.   The patient was advised to call back or seek an in-person evaluation if the symptoms worsen or if the  condition fails to improve as anticipated.    [name], NP 01/04/2021, 4:23 PM Primary Care at [facility]

## 2021-01-03 ENCOUNTER — Encounter: Payer: Self-pay | Admitting: Family

## 2021-01-03 ENCOUNTER — Other Ambulatory Visit: Payer: Self-pay

## 2021-01-03 ENCOUNTER — Ambulatory Visit: Payer: 59 | Admitting: Family

## 2021-01-03 VITALS — BP 143/94 | HR 97 | Ht 67.52 in | Wt 213.2 lb

## 2021-01-03 DIAGNOSIS — Z7689 Persons encountering health services in other specified circumstances: Secondary | ICD-10-CM

## 2021-01-03 NOTE — Patient Instructions (Signed)
Return for annual physical examination, labs, and health maintenance. Arrive fasting meaning having had no food and/or nothing to drink for at least 8 hours prior to appointment.  Please take scheduled medications as normal. Thank you for choosing Primary Care at [facility] for your medical home!    [name] was seen by Camillia Herter, NP today.   [name]'s primary care provider is Camillia Herter, NP.   For the best care possible,  you should try to see [name], NP whenever you come to clinic.   We look forward to seeing you again soon!  If you have any questions about your visit today,  please call us at [phone]  Or feel free to reach your provider via [place].     Diabetes Mellitus Action Plan Following a diabetes action plan is a way for you to manage your diabetes (diabetes mellitus) symptoms. The plan is color-coded to help you understand what actions you need to take based on any symptoms you are having.  If you have symptoms in the red zone, you need medical care right away.  If you have symptoms in the yellow zone, you are having problems.  If you have symptoms in the green zone, you are doing well. Learning about and understanding diabetes can take time. Follow the plan that you develop with your health care provider. Know the target range for your blood sugar (glucose) level, and review your treatment plan with your health care provider at each visit. The target range for my blood sugar level is __________________________ mg/dL. Red zone Get medical help right away if you have any of the following symptoms:  A blood sugar test result that is below 54 mg/dL (3 mmol/L).  A blood sugar test result that is at or above 240 mg/dL (13.3 mmol/L) for 2 days in a row.  Confusion or trouble thinking clearly.  Difficulty breathing.  Sickness or a fever for 2 or more days that is not getting better.  Moderate or large ketone levels in your  urine.  Feeling tired or having no energy. If you have any red zone symptoms, do not wait to see if the symptoms will go away. Get medical help right away. Call your local emergency services (911 in the [place] Do not drive yourself to the hospital. If you have severely low blood sugar (severe hypoglycemia) and you cannot eat or drink, you may need glucagon. Make sure a family member or close friend knows how to check your blood sugar and how to give you glucagon. You may need to be treated in a hospital for this condition.   Yellow zone If you have any of the following symptoms, your diabetes is not under control and you may need to make some changes:  A blood sugar test result that is at or above 240 mg/dL (13.3 mmol/L) for 2 days in a row.  Blood sugar test results that are below 70 mg/dL (3.9 mmol/L).  Other symptoms of hypoglycemia, such as: ? Shaking or feeling light-headed. ? Confusion or irritability. ? Feeling hungry. ? Having a fast heartbeat. If you have any yellow zone symptoms:  Treat your hypoglycemia by eating or drinking 15 grams of a rapid-acting carbohydrate. Follow the 15:15 rule: ? Take 15 grams of a rapid-acting carbohydrate, such as:  1 tube of glucose gel.  4 glucose pills.  4 oz (120 mL) of fruit juice.  4 oz (120 mL) of regular (not diet) soda. ?  Check your blood sugar 15 minutes after you take the carbohydrate. ? If the repeat blood sugar test is still at or below 70 mg/dL (3.9 mmol/L), take 15 grams of a carbohydrate again. ? If your blood sugar does not increase above 70 mg/dL (3.9 mmol/L) after 3 tries, get medical help right away. ? After your blood sugar returns to normal, eat a meal or a snack within 1 hour.  Keep taking your daily medicines as told by your health care provider.  Check your blood sugar more often than you normally would. ? Write down your results. ? Call your health care provider if you have trouble keeping your blood sugar in your  target range.   Green zone These signs mean you are doing well and you can continue what you are doing to manage your diabetes:  Your blood sugar is within your personal target range. For most people, a blood sugar level before a meal (preprandial) should be 80-130 mg/dL (4.4-7.2 mmol/L).  You feel well, and you are able to do daily activities. If you are in the green zone, continue to manage your diabetes as told by your health care provider. To do this:  Eat a healthy diet.  Exercise regularly.  Check your blood sugar as told by your health care provider.  Take your medicines as told by your health care provider.   Where to find more information  American Diabetes Association (ADA): diabetes.org  Association of Diabetes Care & Education Specialists (ADCES): diabeteseducator.org Summary  Following a diabetes action plan is a way for you to manage your diabetes symptoms. The plan is color-coded to help you understand what actions you need to take based on any symptoms you are having.  Follow the plan that you develop with your health care provider. Make sure you know your personal target blood sugar level.  Review your treatment plan with your health care provider at each visit. This information is not intended to replace advice given to you by your health care provider. Make sure you discuss any questions you have with your health care provider. Document Revised: 04/18/2020 Document Reviewed: 04/18/2020 Elsevier Patient Education  [place].

## 2021-01-03 NOTE — Progress Notes (Signed)
Establish care

## 2021-01-16 ENCOUNTER — Other Ambulatory Visit: Payer: Self-pay

## 2021-01-16 ENCOUNTER — Ambulatory Visit: Payer: 59 | Admitting: Endocrinology

## 2021-01-16 VITALS — BP 120/80 | HR 84 | Ht 68.0 in | Wt 212.2 lb

## 2021-01-16 DIAGNOSIS — Z794 Long term (current) use of insulin: Secondary | ICD-10-CM | POA: Diagnosis not present

## 2021-01-16 DIAGNOSIS — E119 Type 2 diabetes mellitus without complications: Secondary | ICD-10-CM

## 2021-01-16 LAB — POCT GLYCOSYLATED HEMOGLOBIN (HGB A1C): Hemoglobin A1C: 7.5 % — AB (ref 4.0–5.6)

## 2021-01-16 MED ORDER — TRULICITY 3 MG/0.5ML ~~LOC~~ SOAJ: 3.0000 mg | SUBCUTANEOUS | 3 refills | Status: DC

## 2021-01-16 NOTE — Patient Instructions (Addendum)
check your blood sugar twice a day.  vary the time of day when you check, between before the 3 meals, and at bedtime.  also check if you have symptoms of your blood sugar being too high or too low.  please keep a record of the readings and bring it to your next appointment here (or you can bring the meter itself).  You can write it on any piece of paper.  please call us sooner if your blood sugar goes below 70, or if you have a lot of readings over 200.   I have sent a prescription to your pharmacy, to increase the Trulicity. Please come back for a follow-up appointment in 3 months.

## 2021-01-16 NOTE — Progress Notes (Signed)
Subjective:    Patient ID: [name], female    DOB: 1966-06-05, 55 y.o.   MRN: [id number]  HPI Pt returns for f/u of diabetes mellitus:  DM type: 2   Dx'ed: 2836 Complications: none Therapy: Trulicity GDM: never DKA: never Severe hypoglycemia: never.  Pancreatitis: never Pancreatic imaging: never Other: she took insulin 2019-2021; she did not tolerate metformin (back pain).   Interval history: no cbg record, but states fasting cbg's vary from 100-165.  pt states she feels well in general.  Pt says she never misses meds.  Past Medical History:  Diagnosis Date   Acute meniscal tear of left knee    Diabetes mellitus without complication ([place])    History of hypothyroidism    Prediabetes 09/20/2017   Elevated blood glucose since 2013 possibly prior.    Past Surgical History:  Procedure Laterality Date   HYSTEROSCOPY W/ THERMACHOICE ENDOMETRIAL ABLATION  03-16-2007   KNEE ARTHROSCOPY Right    KNEE ARTHROSCOPY WITH MEDIAL MENISECTOMY Left 05/17/2015   Procedure: LEFT KNEE ARTHROSCOPY WITH MEDIAL MENISECTOMY, CHONDROPLASTY PATELLA;  Surgeon: Latanya Maudlin, MD;  Location: [place];  Service: Orthopedics;  Laterality: Left;   LAPAROSOCPY BILATERAL TUBAL LIGATION W/ CAUTERIZATION  10-15-2000   VAGINAL HYSTERECTOMY  01/25/2012   Procedure: HYSTERECTOMY VAGINAL;  Surgeon: Anastasio Auerbach, MD;  Location: [facility];  Service: Gynecology;  Laterality: N/A;    Social History   Socioeconomic History   Marital status: Married    Spouse name: Not on file   Number of children: Not on file   Years of education: Not on file   Highest education level: Not on file  Occupational History   Not on file  Tobacco Use   Smoking status: Never Smoker   Smokeless tobacco: Never Used  Vaping Use   Vaping Use: Never used  Substance and Sexual Activity   Alcohol use: No    Alcohol/week: 0.0 standard drinks   Drug use: No   Sexual activity: Not  Currently    Comment: DES NEG,DECLINED INSURANCE QUESTIONS  Other Topics Concern   Not on file  Social History Narrative   Not on file   Social Determinants of Health   Financial Resource Strain: Not on file  Food Insecurity: Not on file  Transportation Needs: Not on file  Physical Activity: Not on file  Stress: Not on file  Social Connections: Not on file  Intimate Partner Violence: Not on file    Current Outpatient Medications on File Prior to Visit  Medication Sig Dispense Refill   Accu-Chek FastClix Lancets MISC USE FASTLIX LANCETS AS INSTRUCTED TO [place]. DX:E11.59 102 each 3   ACCU-CHEK GUIDE test strip USE 1 EACH BY OTHER ROUTE 2 (TWO) TIMES DAILY. E11.9 200 strip 3   BD ULTRA-FINE PEN NEEDLES 29G X 12.7MM MISC USE ONCE DAILY AS DIRECTED 100 each 3   Blood Glucose Monitoring Suppl (ACCU-CHEK GUIDE) w/Device KIT Please specify directions, refills and quantity (Patient taking differently: 1 each by Other route daily. E11.9) 1 kit 0   [DISCONTINUED] fluticasone (FLONASE) 50 MCG/ACT nasal spray Place 1 spray into both nostrils daily. 16 g 2   No current facility-administered medications on file prior to visit.    Allergies  Allergen Reactions   Adhesive [Tape] Rash    Family History  Problem Relation Age of Onset   Hypertension Mother    Cancer Mother        LUG, THROAT, & BACK  Breast cancer Mother    Cancer Father        THROAT   Throat cancer Father    Cancer Maternal Grandmother        KIDNEY   Cancer Maternal Grandfather        COLON   Colon cancer Maternal Grandfather 44   Diabetes Daughter    Stomach cancer Neg Hx     BP 120/80 (BP Location: Right Arm, Patient Position: Sitting, Cuff Size: Large)    Pulse 84    Ht '5\' 8"'  (1.727 m)    Wt 212 lb 3.2 oz (96.3 kg)    LMP 01/03/2012    SpO2 98%    BMI 32.26 kg/m    Review of Systems Denies n/v    Objective:   Physical Exam VITAL SIGNS:  See vs page GENERAL: no  distress Pulses: dorsalis pedis intact bilat.   MSK: no deformity of the feet CV: no leg edema Skin:  no ulcer on the feet.  normal color and temp on the feet. Neuro: sensation is intact to touch on the feet   A1c=7.5%    Assessment & Plan:  Type 2 DM: uncontrolled   Patient Instructions  check your blood sugar twice a day.  vary the time of day when you check, between before the 3 meals, and at bedtime.  also check if you have symptoms of your blood sugar being too high or too low.  please keep a record of the readings and bring it to your next appointment here (or you can bring the meter itself).  You can write it on any piece of paper.  please call us sooner if your blood sugar goes below 70, or if you have a lot of readings over 200.   I have sent a prescription to your pharmacy, to increase the Trulicity. Please come back for a follow-up appointment in 3 months.

## 2021-02-13 NOTE — Progress Notes (Signed)
Patient ID: [name], female    DOB: 08-19-1966  MRN: [id number]  CC: Annual Physical Exam  Subjective: [name] is a 55 y.o. female who presents for annual physical exam. Her concerns today include:   Plans to have left knee surgery soon. Reports it is bone on bone. During Orthopedics pre-op surgical clearance hemoglobin A1c of 7.5% discussed. Reports told A1c should be less than 7.5% before surgery will be scheduled. Was not give an exact A1c goal that she should achieve only that it should be less than 7.5%. Last visit 01/16/2021 with Endocrinology Renato Shin, MD where Trulicity increased from 1.5 mg to 3 mg weekly. She is actively working on [employer] and has lost 8 pounds since 4 weeks ago.   Patient Active Problem List   Diagnosis Date Noted  . Chronic midline low back pain without sciatica 02/13/2018  . Mixed diabetic hyperlipidemia associated with type 2 diabetes mellitus ([place]) 10/25/2017  . Hypertension associated with diabetes ([place]) 10/25/2017  . Diabetes mellitus, type II ([place]) 10/25/2017  . H/O total hysterectomy- benign reasons 09/27/17  . Left knee pain- Dr Amedeo Plenty 09/27/2017  . Perimenopausal 09-27-2017  . Family history of breast cancer in mother-deceased late 2023-01-04 09-27-17  . Family history of colon cancer- MGF- in 60's 09/27/2017  . Blood glucose elevated 09/27/2017  . Elevated alanine aminotransferase (ALT) level 2017-09-27  . Low HDL (under 40) Sep 27, 2017  . Severe obesity (BMI 35.0-35.9 with comorbidity) ([place]) September 27, 2017  . Vitamin D deficiency 09/27/17  . Family history of diabetes mellitus (DM)- in daughter 09-27-2017  . Hypothyroidism 02/08/2013     Current Outpatient Medications on File Prior to Visit  Medication Sig Dispense Refill  . Accu-Chek FastClix Lancets MISC USE FASTLIX LANCETS AS INSTRUCTED TO CHECK BLOOD SUGAR TWICE DAILY. DX:E11.59 102 each 3  . ACCU-CHEK GUIDE test strip USE 1 EACH BY OTHER ROUTE 2 (TWO) TIMES  DAILY. E11.9 200 strip 3  . BD ULTRA-FINE PEN NEEDLES 29G X 12.7MM MISC USE ONCE DAILY AS DIRECTED 100 each 3  . Blood Glucose Monitoring Suppl (ACCU-CHEK GUIDE) w/Device KIT Please specify directions, refills and quantity (Patient taking differently: 1 each by Other route daily. E11.9) 1 kit 0  . Dulaglutide (TRULICITY) 3 YK/9.9IP SOPN Inject 3 mg as directed once a week. 6 mL 3  . [DISCONTINUED] fluticasone (FLONASE) 50 MCG/ACT nasal spray Place 1 spray into both nostrils daily. 16 g 2   No current facility-administered medications on file prior to visit.    Allergies  Allergen Reactions  . Adhesive [Tape] Rash    Social History   Socioeconomic History  . Marital status: Married    Spouse name: Not on file  . Number of children: Not on file  . Years of education: Not on file  . Highest education level: Not on file  Occupational History  . Not on file  Tobacco Use  . Smoking status: Never Smoker  . Smokeless tobacco: Never Used  Vaping Use  . Vaping Use: Never used  Substance and Sexual Activity  . Alcohol use: No    Alcohol/week: 0.0 standard drinks  . Drug use: No  . Sexual activity: Not Currently    Comment: DES NEG,DECLINED INSURANCE QUESTIONS  Other Topics Concern  . Not on file  Social History Narrative  . Not on file   Social Determinants of Health   Financial Resource Strain: Not on file  Food Insecurity: Not on file  Transportation Needs: Not on file  Physical Activity: Not on file  Stress: Not on file  Social Connections: Not on file  Intimate Partner Violence: Not on file    Family History  Problem Relation Age of Onset  . Hypertension Mother   . Cancer Mother        LUG, THROAT, & BACK  . Breast cancer Mother   . Cancer Father        THROAT  . Throat cancer Father   . Cancer Maternal Grandmother        KIDNEY  . Cancer Maternal Grandfather        COLON  . Colon cancer Maternal Grandfather 57  . Diabetes Daughter   . Stomach cancer Neg Hx      Past Surgical History:  Procedure Laterality Date  . Spray ENDOMETRIAL ABLATION  03-16-2007  . KNEE ARTHROSCOPY Right   . KNEE ARTHROSCOPY WITH MEDIAL MENISECTOMY Left 05/17/2015   Procedure: LEFT KNEE ARTHROSCOPY WITH MEDIAL MENISECTOMY, CHONDROPLASTY PATELLA;  Surgeon: Latanya Maudlin, MD;  Location: [facility];  Service: Orthopedics;  Laterality: Left;  . LAPAROSOCPY BILATERAL TUBAL LIGATION W/ CAUTERIZATION  10-15-2000  . VAGINAL HYSTERECTOMY  01/25/2012   Procedure: HYSTERECTOMY VAGINAL;  Surgeon: Anastasio Auerbach, MD;  Location: [facility];  Service: Gynecology;  Laterality: N/A;    ROS: Review of Systems Negative except as stated above  PHYSICAL EXAM: BP 123/88 (BP Location: Left Arm, Patient Position: Sitting)   Pulse 94   Ht 5' 7.99" (1.727 m)   Wt 204 lb 12.8 oz (92.9 kg)   LMP 01/03/2012   SpO2 97%   BMI 31.15 kg/m    Wt Readings from Last 3 Encounters:  02/14/21 204 lb 12.8 oz (92.9 kg)  01/16/21 212 lb 3.2 oz (96.3 kg)  01/03/21 213 lb 3.2 oz (96.7 kg)     Physical Exam HENT:     Head: Normocephalic and atraumatic.     Right Ear: Tympanic membrane, ear canal and external ear normal.     Left Ear: Tympanic membrane, ear canal and external ear normal.     Nose: Nose normal.     Mouth/Throat:     Mouth: Mucous membranes are moist.     Pharynx: Oropharynx is clear.  Eyes:     Extraocular Movements: Extraocular movements intact.     Conjunctiva/sclera: Conjunctivae normal.     Pupils: Pupils are equal, round, and reactive to light.  Cardiovascular:     Rate and Rhythm: Normal rate and regular rhythm.     Pulses: Normal pulses.     Heart sounds: Normal heart sounds.  Pulmonary:     Effort: Pulmonary effort is normal.     Breath sounds: Normal breath sounds.  Chest:     Comments: Last mammogram 05/24/2020. Abdominal:     General: Bowel sounds are normal.     Palpations: Abdomen is soft.  Genitourinary:    Comments:  Gynecology visit completed 12/17/2020.  Musculoskeletal:        General: Normal range of motion.     Cervical back: Normal range of motion and neck supple.  Skin:    General: Skin is warm and dry.     Capillary Refill: Capillary refill takes less than 2 seconds.  Neurological:     General: No focal deficit present.     Mental Status: She is alert and oriented to person, place, and time.  Psychiatric:        Mood and Affect: Mood normal.  Behavior: Behavior normal.    ASSESSMENT AND PLAN: 1. Annual physical exam: - Counseled on 150 minutes of exercise per week as tolerated, healthy eating (including decreased daily intake of saturated fats, cholesterol, added sugars, sodium), STI prevention, and routine healthcare maintenance.  2. Screening for metabolic disorder: - CMP to check kidney function, liver function, and electrolyte balance.  - Comprehensive metabolic panel  3. Screening for deficiency anemia: - CBC to screen for anemia. - CBC  4. Screening cholesterol level: - Lipid panel to screen for high cholesterol.  - Lipid panel  5. Thyroid disorder screen: - TSH to check thyroid function.  - TSH+T4F+T3Free  6. Need for hepatitis C screening test: - Hepatitis C antibody to screen for hepatitis C.  - Hepatitis C Antibody  7. Encounter for screening for HIV: - HIV antibody to screen for human immunodeficiency virus.  - HIV antibody (with reflex)  8. Type 2 diabetes mellitus without complication, with long-term current use of insulin ([place]): - Microalbumin / creatinine urine ratio to check kidney function.  - Keep all appointments with Endocrinology.  - Microalbumin / creatinine urine ratio; Future    Patient was given the opportunity to ask questions.  Patient verbalized understanding of the plan and was able to repeat key elements of the plan. Patient was given clear instructions to go to Emergency Department or return to medical center if symptoms don't improve,  worsen, or new problems develop.The patient verbalized understanding.   Orders Placed This Encounter  Procedures  . Hepatitis C Antibody  . HIV antibody (with reflex)  . CBC  . Comprehensive metabolic panel  . Lipid panel  . TSH+T4F+T3Free  . Microalbumin / creatinine urine ratio     Requested Prescriptions   Signed Prescriptions Disp Refills  . atorvastatin (LIPITOR) 20 MG tablet 90 tablet 0    Sig: Take 1 tablet (20 mg total) by mouth daily.    Follow-up with primary provider as scheduled.   Camillia Herter, NP

## 2021-02-14 ENCOUNTER — Ambulatory Visit (INDEPENDENT_AMBULATORY_CARE_PROVIDER_SITE_OTHER): Payer: 59 | Admitting: Family

## 2021-02-14 ENCOUNTER — Other Ambulatory Visit: Payer: Self-pay

## 2021-02-14 VITALS — BP 123/88 | HR 94 | Ht 67.99 in | Wt 204.8 lb

## 2021-02-14 DIAGNOSIS — Z130 Encounter for screening for diseases of the blood and blood-forming organs and certain disorders involving the immune mechanism: Secondary | ICD-10-CM

## 2021-02-14 DIAGNOSIS — Z13220 Encounter for screening for lipoid disorders: Secondary | ICD-10-CM | POA: Diagnosis not present

## 2021-02-14 DIAGNOSIS — Z0000 Encounter for general adult medical examination without abnormal findings: Secondary | ICD-10-CM

## 2021-02-14 DIAGNOSIS — Z1329 Encounter for screening for other suspected endocrine disorder: Secondary | ICD-10-CM

## 2021-02-14 DIAGNOSIS — E119 Type 2 diabetes mellitus without complications: Secondary | ICD-10-CM

## 2021-02-14 DIAGNOSIS — Z1159 Encounter for screening for other viral diseases: Secondary | ICD-10-CM

## 2021-02-14 DIAGNOSIS — Z114 Encounter for screening for human immunodeficiency virus [HIV]: Secondary | ICD-10-CM

## 2021-02-14 DIAGNOSIS — Z13228 Encounter for screening for other metabolic disorders: Secondary | ICD-10-CM

## 2021-02-14 DIAGNOSIS — E782 Mixed hyperlipidemia: Secondary | ICD-10-CM

## 2021-02-14 DIAGNOSIS — E1169 Type 2 diabetes mellitus with other specified complication: Secondary | ICD-10-CM

## 2021-02-14 DIAGNOSIS — Z124 Encounter for screening for malignant neoplasm of cervix: Secondary | ICD-10-CM

## 2021-02-14 DIAGNOSIS — Z794 Long term (current) use of insulin: Secondary | ICD-10-CM

## 2021-02-14 NOTE — Progress Notes (Signed)
Physical Gyn visit completed 12/17/20 w/Tiffany Juleen China, NP

## 2021-02-14 NOTE — Patient Instructions (Signed)
 Annual physical exam and labs today.   Follow-up with primary provider as scheduled. Preventive Care 40-54 Years Old, Female Preventive care refers to lifestyle choices and visits with your health care provider that can promote health and wellness. This includes:  A yearly physical exam. This is also called an annual wellness visit.  Regular dental and eye exams.  Immunizations.  Screening for certain conditions.  Healthy lifestyle choices, such as: ? Eating a healthy diet. ? Getting regular exercise. ? Not using drugs or products that contain nicotine and tobacco. ? Limiting alcohol use. What can I expect for my preventive care visit? Physical exam Your health care provider will check your:  Height and weight. These may be used to calculate your BMI (body mass index). BMI is a measurement that tells if you are at a healthy weight.  Heart rate and blood pressure.  Body temperature.  Skin for abnormal spots. Counseling Your health care provider may ask you questions about your:  Past medical problems.  Family's medical history.  Alcohol, tobacco, and drug use.  Emotional well-being.  Home life and relationship well-being.  Sexual activity.  Diet, exercise, and sleep habits.  Work and work environment.  Access to firearms.  Method of birth control.  Menstrual cycle.  Pregnancy history. What immunizations do I need? Vaccines are usually given at various ages, according to a schedule. Your health care provider will recommend vaccines for you based on your age, medical history, and lifestyle or other factors, such as travel or where you work.   What tests do I need? Blood tests  Lipid and cholesterol levels. These may be checked every 5 years, or more often if you are over 50 years old.  Hepatitis C test.  Hepatitis B test. Screening  Lung cancer screening. You may have this screening every year starting at age 55 if you have a 30-pack-year history of  smoking and currently smoke or have quit within the past 15 years.  Colorectal cancer screening. ? All adults should have this screening starting at age 50 and continuing until age 75. ? Your health care provider may recommend screening at age 45 if you are at increased risk. ? You will have tests every 1-10 years, depending on your results and the type of screening test.  Diabetes screening. ? This is done by checking your blood sugar (glucose) after you have not eaten for a while (fasting). ? You may have this done every 1-3 years.  Mammogram. ? This may be done every 1-2 years. ? Talk with your health care provider about when you should start having regular mammograms. This may depend on whether you have a family history of breast cancer.  BRCA-related cancer screening. This may be done if you have a family history of breast, ovarian, tubal, or peritoneal cancers.  Pelvic exam and Pap test. ? This may be done every 3 years starting at age 21. ? Starting at age 30, this may be done every 5 years if you have a Pap test in combination with an HPV test. Other tests  STD (sexually transmitted disease) testing, if you are at risk.  Bone density scan. This is done to screen for osteoporosis. You may have this scan if you are at high risk for osteoporosis. Talk with your health care provider about your test results, treatment options, and if necessary, the need for more tests. Follow these instructions at home: Eating and drinking  Eat a diet that includes fresh fruits and   vegetables, whole grains, lean protein, and low-fat dairy products.  Take vitamin and mineral supplements as recommended by your health care provider.  Do not drink alcohol if: ? Your health care provider tells you not to drink. ? You are pregnant, may be pregnant, or are planning to become pregnant.  If you drink alcohol: ? Limit how much you have to 0-1 drink a day. ? Be aware of how much alcohol is in your  drink. In the [place] one drink equals one 12 oz bottle of beer (355 mL), one 5 oz glass of wine (148 mL), or one 1 oz glass of hard liquor (44 mL).   Lifestyle  Take daily care of your teeth and gums. Brush your teeth every morning and night with fluoride toothpaste. Floss one time each day.  Stay active. Exercise for at least 30 minutes 5 or more days each week.  Do not use any products that contain nicotine or tobacco, such as cigarettes, e-cigarettes, and chewing tobacco. If you need help quitting, ask your health care provider.  Do not use drugs.  If you are sexually active, practice safe sex. Use a condom or other form of protection to prevent STIs (sexually transmitted infections).  If you do not wish to become pregnant, use a form of birth control. If you plan to become pregnant, see your health care provider for a prepregnancy visit.  If told by your health care provider, take low-dose aspirin daily starting at age 50.  Find healthy ways to cope with stress, such as: ? Meditation, yoga, or listening to music. ? Journaling. ? Talking to a trusted person. ? Spending time with friends and family. Safety  Always wear your seat belt while driving or riding in a vehicle.  Do not drive: ? If you have been drinking alcohol. Do not ride with someone who has been drinking. ? When you are tired or distracted. ? While texting.  Wear a helmet and other protective equipment during sports activities.  If you have firearms in your house, make sure you follow all gun safety procedures. What's next?  Visit your health care provider once a year for an annual wellness visit.  Ask your health care provider how often you should have your eyes and teeth checked.  Stay up to date on all vaccines. This information is not intended to replace advice given to you by your health care provider. Make sure you discuss any questions you have with your health care provider. Document Revised:  07/16/2020 Document Reviewed: 06/23/2018 Elsevier Patient Education  2021 Elsevier Inc.  

## 2021-02-15 LAB — COMPREHENSIVE METABOLIC PANEL
ALT: 33 IU/L — ABNORMAL HIGH (ref 0–32)
AST: 23 IU/L (ref 0–40)
Albumin/Globulin Ratio: 1.7 (ref 1.2–2.2)
Albumin: 4.4 g/dL (ref 3.8–4.9)
Alkaline Phosphatase: 83 IU/L (ref 44–121)
BUN/Creatinine Ratio: 9 (ref 9–23)
BUN: 8 mg/dL (ref 6–24)
Bilirubin Total: 0.3 mg/dL (ref 0.0–1.2)
CO2: 21 mmol/L (ref 20–29)
Calcium: 9.6 mg/dL (ref 8.7–10.2)
Chloride: 102 mmol/L (ref 96–106)
Creatinine, Ser: 0.93 mg/dL (ref 0.57–1.00)
Globulin, Total: 2.6 g/dL (ref 1.5–4.5)
Glucose: 93 mg/dL (ref 65–99)
Potassium: 4 mmol/L (ref 3.5–5.2)
Sodium: 140 mmol/L (ref 134–144)
Total Protein: 7 g/dL (ref 6.0–8.5)
eGFR: 73 mL/min/{1.73_m2} (ref >59)

## 2021-02-15 LAB — CBC
Hematocrit: 40.9 % (ref 34.0–46.6)
Hemoglobin: 13.5 g/dL (ref 11.1–15.9)
MCH: 27.8 pg (ref 26.6–33.0)
MCHC: 33 g/dL (ref 31.5–35.7)
MCV: 84 fL (ref 79–97)
Platelets: 328 10*3/uL (ref 150–450)
RBC: 4.85 x10E6/uL (ref 3.77–5.28)
RDW: 13 % (ref 11.7–15.4)
WBC: 5.6 10*3/uL (ref 3.4–10.8)

## 2021-02-15 LAB — HEPATITIS C ANTIBODY: Hep C Virus Ab: <0.1 s/co ratio (ref 0.0–0.9)

## 2021-02-15 LAB — LIPID PANEL
Chol/HDL Ratio: 4.9 ratio — ABNORMAL HIGH (ref 0.0–4.4)
Cholesterol, Total: 197 mg/dL (ref 100–199)
HDL: 40 mg/dL (ref >39)
LDL Chol Calc (NIH): 132 mg/dL — ABNORMAL HIGH (ref 0–99)
Triglycerides: 137 mg/dL (ref 0–149)
VLDL Cholesterol Cal: 25 mg/dL (ref 5–40)

## 2021-02-15 LAB — TSH+T4F+T3FREE
Free T4: 1.14 ng/dL (ref 0.82–1.77)
T3, Free: 3.2 pg/mL (ref 2.0–4.4)
TSH: 4.21 u[IU]/mL (ref 0.450–4.500)

## 2021-02-15 LAB — HIV ANTIBODY (ROUTINE TESTING W REFLEX): HIV Screen 4th Generation wRfx: NONREACTIVE

## 2021-02-15 MED ORDER — ATORVASTATIN CALCIUM 20 MG PO TABS: 20.0000 mg | ORAL_TABLET | Freq: Every day | ORAL | 0 refills | Status: DC

## 2021-02-15 NOTE — Progress Notes (Signed)
Kidney function normal.   Thyroid function normal.   No anemia.   Hepatitis C negative.   HIV negative.   ALT mildly elevated. This is used to check liver function.   Some causes of an elevation of ALT are increased alcohol consumption or viral hepatitis.  Encouraged to have rechecked in 8 weeks.  Cholesterol higher than expected. High cholesterol may increase risk of heart attack and/or stroke. Consider eating more fruits, vegetables, and lean baked meats such as chicken or fish. Moderate intensity exercise at least 150 minutes as tolerated per week may help as well.   Atorvastatin added for high cholesterol. Encouraged to have rechecked in 8 weeks.  The following is for provider reference only: The 10-year ASCVD risk score is: 8.2%   Values used to calculate the score:     Age: 56 years     Sex: Female     Is Non-Hispanic African American: Yes     Diabetic: Yes     Tobacco smoker: No     Systolic Blood Pressure: 111 mmHg     Is BP treated: No     HDL Cholesterol: 40 mg/dL     Total Cholesterol: 197 mg/dL

## 2021-02-18 ENCOUNTER — Encounter: Payer: 59 | Admitting: Family

## 2021-03-17 ENCOUNTER — Telehealth: Payer: Self-pay

## 2021-03-17 NOTE — Telephone Encounter (Signed)
Spoke w/pt advised in order to complete surgical clearance form an EKG will need to be completed. Pt stated she will need to speak w/daughter about transportation

## 2021-03-19 ENCOUNTER — Telehealth: Payer: Self-pay | Admitting: Family

## 2021-03-23 NOTE — Progress Notes (Signed)
Patient ID: [name], female    DOB: Sep 11, 1966  MRN: [id number]  CC: Preoperative Clearance  Subjective: [name] is a 55 y.o. female who presents for preoperative clearance.  Her concerns today include: requesting EKG and updated hemoglobin A1c pending left total knee replacement.  Patient Active Problem List   Diagnosis Date Noted  . Chronic midline low back pain without sciatica 02/13/2018  . Mixed diabetic hyperlipidemia associated with type 2 diabetes mellitus ([place]) 10/25/2017  . Hypertension associated with diabetes ([place]) 10/25/2017  . Diabetes mellitus, type II ([place]) 10/25/2017  . H/O total hysterectomy- benign reasons 09/25/17  . Left knee pain- Dr Amedeo Plenty Sep 25, 2017  . Perimenopausal 09-25-2017  . Family history of breast cancer in mother-deceased late 2023-01-02 September 25, 2017  . Family history of colon cancer- MGF- in 60's September 25, 2017  . Blood glucose elevated 09-25-17  . Elevated alanine aminotransferase (ALT) level 09-25-2017  . Low HDL (under 40) September 25, 2017  . Severe obesity (BMI 35.0-35.9 with comorbidity) ([place]) 2017/09/25  . Vitamin D deficiency 25-Sep-2017  . Family history of diabetes mellitus (DM)- in daughter 09/25/2017  . Hypothyroidism 02/08/2013     Current Outpatient Medications on File Prior to Visit  Medication Sig Dispense Refill  . Accu-Chek FastClix Lancets MISC USE FASTLIX LANCETS AS INSTRUCTED TO CHECK BLOOD SUGAR TWICE DAILY. DX:E11.59 102 each 3  . ACCU-CHEK GUIDE test strip USE 1 EACH BY OTHER ROUTE 2 (TWO) TIMES DAILY. E11.9 200 strip 3  . atorvastatin (LIPITOR) 20 MG tablet Take 1 tablet (20 mg total) by mouth daily. 90 tablet 0  . BD ULTRA-FINE PEN NEEDLES 29G X 12.7MM MISC USE ONCE DAILY AS DIRECTED 100 each 3  . Blood Glucose Monitoring Suppl (ACCU-CHEK GUIDE) w/Device KIT Please specify directions, refills and quantity (Patient taking differently: 1 each by Other route daily. E11.9) 1 kit 0  . Dulaglutide (TRULICITY) 3 TI/1.4ER  SOPN Inject 3 mg as directed once a week. 6 mL 3  . [DISCONTINUED] fluticasone (FLONASE) 50 MCG/ACT nasal spray Place 1 spray into both nostrils daily. 16 g 2   No current facility-administered medications on file prior to visit.    Allergies  Allergen Reactions  . Adhesive [Tape] Rash    Social History   Socioeconomic History  . Marital status: Married    Spouse name: Not on file  . Number of children: Not on file  . Years of education: Not on file  . Highest education level: Not on file  Occupational History  . Not on file  Tobacco Use  . Smoking status: Never Smoker  . Smokeless tobacco: Never Used  Vaping Use  . Vaping Use: Never used  Substance and Sexual Activity  . Alcohol use: No    Alcohol/week: 0.0 standard drinks  . Drug use: No  . Sexual activity: Not Currently    Comment: DES NEG,DECLINED INSURANCE QUESTIONS  Other Topics Concern  . Not on file  Social History Narrative  . Not on file   Social Determinants of Health   Financial Resource Strain: Not on file  Food Insecurity: Not on file  Transportation Needs: Not on file  Physical Activity: Not on file  Stress: Not on file  Social Connections: Not on file  Intimate Partner Violence: Not on file    Family History  Problem Relation Age of Onset  . Hypertension Mother   . Cancer Mother        LUG, THROAT, & BACK  . Breast cancer Mother   .  Cancer Father        THROAT  . Throat cancer Father   . Cancer Maternal Grandmother        KIDNEY  . Cancer Maternal Grandfather        COLON  . Colon cancer Maternal Grandfather 46  . Diabetes Daughter   . Stomach cancer Neg Hx     Past Surgical History:  Procedure Laterality Date  . [place] ENDOMETRIAL ABLATION  03-16-2007  . KNEE ARTHROSCOPY Right   . KNEE ARTHROSCOPY WITH MEDIAL MENISECTOMY Left 05/17/2015   Procedure: LEFT KNEE ARTHROSCOPY WITH MEDIAL MENISECTOMY, CHONDROPLASTY PATELLA;  Surgeon: Latanya Maudlin, MD;  Location:  [facility];  Service: Orthopedics;  Laterality: Left;  . LAPAROSOCPY BILATERAL TUBAL LIGATION W/ CAUTERIZATION  10-15-2000  . VAGINAL HYSTERECTOMY  01/25/2012   Procedure: HYSTERECTOMY VAGINAL;  Surgeon: Anastasio Auerbach, MD;  Location: [facility];  Service: Gynecology;  Laterality: N/A;    ROS: Review of Systems Negative except as stated above  PHYSICAL EXAM: BP 123/84 (BP Location: Left Arm, Patient Position: Sitting, Cuff Size: Normal)   Pulse 88   Temp 98.3 F (36.8 C)   Resp 15   Ht 5' 7.99" (1.727 m)   Wt 199 lb (90.3 kg)   LMP 01/03/2012   SpO2 99%   BMI 30.26 kg/m   Physical Exam HENT:     Head: Normocephalic and atraumatic.  Eyes:     Extraocular Movements: Extraocular movements intact.     Conjunctiva/sclera: Conjunctivae normal.     Pupils: Pupils are equal, round, and reactive to light.  Cardiovascular:     Rate and Rhythm: Normal rate and regular rhythm.     Pulses: Normal pulses.     Heart sounds: Normal heart sounds.  Pulmonary:     Effort: Pulmonary effort is normal.     Breath sounds: Normal breath sounds.  Musculoskeletal:     Cervical back: Normal range of motion and neck supple.  Neurological:     General: No focal deficit present.     Mental Status: She is alert and oriented to person, place, and time.  Psychiatric:        Mood and Affect: Mood normal.        Behavior: Behavior normal.    Results for orders placed or performed in visit on 03/25/21  POCT glycosylated hemoglobin (Hb A1C)  Result Value Ref Range   Hemoglobin A1C 6.4 (A) 4.0 - 5.6 %   HbA1c POC (<> result, manual entry)     HbA1c, POC (prediabetic range)     HbA1c, POC (controlled diabetic range)      ASSESSMENT AND PLAN: 1. Preoperative clearance: - Pending left total knee replacement.  - EKG today in office normal sinus rhythm. - EKG 12-Lead  2. Type 2 diabetes mellitus without complication, with long-term current use of insulin ([place]): - Pre-operative  clearance pending left total knee replacement requirement hemoglobin A1c < 7.5%.  - Patient's hemoglobin today 6.4%.  - Keep appointments with Endocrinology as scheduled. - POCT glycosylated hemoglobin (Hb A1C)   Patient was given the opportunity to ask questions.  Patient verbalized understanding of the plan and was able to repeat key elements of the plan. Patient was given clear instructions to go to Emergency Department or return to medical center if symptoms don't improve, worsen, or new problems develop.The patient verbalized understanding.   Orders Placed This Encounter  Procedures  . POCT glycosylated hemoglobin (Hb A1C)  . EKG 12-Lead  Follow-up with primary provider as scheduled.   Camillia Herter, NP

## 2021-03-25 ENCOUNTER — Ambulatory Visit: Payer: 59 | Admitting: Family

## 2021-03-25 ENCOUNTER — Other Ambulatory Visit: Payer: Self-pay

## 2021-03-25 ENCOUNTER — Encounter: Payer: Self-pay | Admitting: Family

## 2021-03-25 VITALS — BP 123/84 | HR 88 | Temp 98.3°F | Resp 15 | Ht 67.99 in | Wt 199.0 lb

## 2021-03-25 DIAGNOSIS — E119 Type 2 diabetes mellitus without complications: Secondary | ICD-10-CM

## 2021-03-25 DIAGNOSIS — Z794 Long term (current) use of insulin: Secondary | ICD-10-CM | POA: Diagnosis not present

## 2021-03-25 DIAGNOSIS — Z01818 Encounter for other preprocedural examination: Secondary | ICD-10-CM | POA: Diagnosis not present

## 2021-03-25 LAB — POCT GLYCOSYLATED HEMOGLOBIN (HGB A1C): Hemoglobin A1C: 6.4 % — AB (ref 4.0–5.6)

## 2021-03-25 NOTE — Progress Notes (Signed)
Preoperative clearance-pt scheduled for left total knee replacement

## 2021-03-25 NOTE — Patient Instructions (Signed)
Electrocardiogram An electrocardiogram (ECG or EKG) is a test to check your heart. The test is simple and safe, and it does not hurt. It may be done:  As a part of a physical exam.  To check out symptoms like chest pain or a fast or uneven heartbeat (palpitations).  To see how certain heart treatments are working. Tell a health care provider about:  Any allergies you have.  All medicines you are taking. These include vitamins, herbs, eye drops, creams, and over-the-counter medicines. What are the risks? There are no risks. What happens before the test?  There is nothing you need to do to prepare.  Do not exercise or drink cold water right before the test. These can affect the results. What happens during the test?  You will take off your clothes from the waist up.  You will lie on your back.  Hair may be removed from your chest, arms, and legs.  Sticky patches (electrodes) will be placed on your chest, arms, and legs.  Wires (leads) will be attached to the sticky patches and to a machine.  You will be asked to relax and lie still while the machine checks your heart. The test may vary among doctors and hospitals.   What can I expect after the test?  Your test results will be looked at by a doctor.  It is up to you to get your test results. Ask how to get your results when they are ready. Summary  An electrocardiogram (ECG or EKG) is a test to check your heart.  The test is simple and safe, and it does not hurt. There are no risks of having this test. You do not need to prepare for the test.  Sticky patches (electrodes) will be placed on your chest, arms, and legs. Wires (leads) will be attached to the sticky patches and to a machine.  While you lie still on your back, the machine will check your heart. This information is not intended to replace advice given to you by your health care provider. Make sure you discuss any questions you have with your health care  provider. Document Revised: 06/04/2020 Document Reviewed: 06/04/2020 Elsevier Patient Education  2021 [employer].

## 2021-04-14 ENCOUNTER — Encounter: Payer: Self-pay | Admitting: Family

## 2021-04-18 ENCOUNTER — Ambulatory Visit: Payer: 59 | Admitting: Endocrinology

## 2021-04-18 ENCOUNTER — Other Ambulatory Visit: Payer: Self-pay

## 2021-04-18 VITALS — BP 142/90 | HR 74 | Ht 68.0 in | Wt 204.8 lb

## 2021-04-18 DIAGNOSIS — E119 Type 2 diabetes mellitus without complications: Secondary | ICD-10-CM

## 2021-04-18 DIAGNOSIS — Z794 Long term (current) use of insulin: Secondary | ICD-10-CM

## 2021-04-18 LAB — POCT GLYCOSYLATED HEMOGLOBIN (HGB A1C): Hemoglobin A1C: 6.6 % — AB (ref 4.0–5.6)

## 2021-04-18 MED ORDER — TRULICITY 4.5 MG/0.5ML ~~LOC~~ SOAJ: 4.5000 mg | SUBCUTANEOUS | 3 refills | Status: DC

## 2021-04-18 NOTE — Patient Instructions (Addendum)
check your blood sugar twice a day.  vary the time of day when you check, between before the 3 meals, and at bedtime.  also check if you have symptoms of your blood sugar being too high or too low.  please keep a record of the readings and bring it to your next appointment here (or you can bring the meter itself).  You can write it on any piece of paper.  please call us sooner if your blood sugar goes below 70, or if you have a lot of readings over 200.   I have sent a prescription to your pharmacy, to increase the Trulicity.   Please come back for a follow-up appointment in 4 months.

## 2021-04-18 NOTE — Progress Notes (Signed)
Subjective:    Patient ID: [name], female    DOB: 1965-12-26, 55 y.o.   MRN: [id number]  HPI Pt returns for f/u of diabetes mellitus:  DM type: 2   Dx'ed: 0315 Complications: none Therapy: Trulicity GDM: never DKA: never Severe hypoglycemia: never.  Pancreatitis: never Pancreatic imaging: never Other: she took insulin 2019-2021; she did not tolerate metformin (back pain).    Interval history: no cbg record, but states fasting cbg's vary from 100-165.  pt states she feels well in general.  Pt says she never misses meds.  She will have TKR soon Past Medical History:  Diagnosis Date   Acute meniscal tear of left knee    Diabetes mellitus without complication ([place])    History of hypothyroidism    Prediabetes 09/20/2017   Elevated blood glucose since 2013 possibly prior.    Past Surgical History:  Procedure Laterality Date   HYSTEROSCOPY W/ THERMACHOICE ENDOMETRIAL ABLATION  03-16-2007   KNEE ARTHROSCOPY Right    KNEE ARTHROSCOPY WITH MEDIAL MENISECTOMY Left 05/17/2015   Procedure: LEFT KNEE ARTHROSCOPY WITH MEDIAL MENISECTOMY, CHONDROPLASTY PATELLA;  Surgeon: Latanya Maudlin, MD;  Location: [facility];  Service: Orthopedics;  Laterality: Left;   LAPAROSOCPY BILATERAL TUBAL LIGATION W/ CAUTERIZATION  10-15-2000   VAGINAL HYSTERECTOMY  01/25/2012   Procedure: HYSTERECTOMY VAGINAL;  Surgeon: Anastasio Auerbach, MD;  Location: [facility];  Service: Gynecology;  Laterality: N/A;    Social History   Socioeconomic History   Marital status: Married    Spouse name: Not on file   Number of children: Not on file   Years of education: Not on file   Highest education level: Not on file  Occupational History   Not on file  Tobacco Use   Smoking status: Never   Smokeless tobacco: Never  Vaping Use   Vaping Use: Never used  Substance and Sexual Activity   Alcohol use: No    Alcohol/week: 0.0 standard drinks   Drug use: No   Sexual activity: Not Currently     Comment: DES NEG,DECLINED INSURANCE QUESTIONS  Other Topics Concern   Not on file  Social History Narrative   Not on file   Social Determinants of Health   Financial Resource Strain: Not on file  Food Insecurity: Not on file  Transportation Needs: Not on file  Physical Activity: Not on file  Stress: Not on file  Social Connections: Not on file  Intimate Partner Violence: Not on file    Current Outpatient Medications on File Prior to Visit  Medication Sig Dispense Refill   Accu-Chek FastClix Lancets MISC USE FASTLIX LANCETS AS INSTRUCTED TO [place]. DX:E11.59 102 each 3   ACCU-CHEK GUIDE test strip USE 1 EACH BY OTHER ROUTE 2 (TWO) TIMES DAILY. E11.9 200 strip 3   atorvastatin (LIPITOR) 20 MG tablet Take 1 tablet (20 mg total) by mouth daily. 90 tablet 0   BD ULTRA-FINE PEN NEEDLES 29G X 12.7MM MISC USE ONCE DAILY AS DIRECTED 100 each 3   Blood Glucose Monitoring Suppl (ACCU-CHEK GUIDE) w/Device KIT Please specify directions, refills and quantity (Patient taking differently: 1 each by Other route daily. E11.9) 1 kit 0   [DISCONTINUED] fluticasone (FLONASE) 50 MCG/ACT nasal spray Place 1 spray into both nostrils daily. 16 g 2   No current facility-administered medications on file prior to visit.    Allergies  Allergen Reactions   Adhesive [Tape] Rash    Family History  Problem Relation Age  of Onset   Hypertension Mother    Cancer Mother        LUG, THROAT, & BACK   Breast cancer Mother    Cancer Father        THROAT   Throat cancer Father    Cancer Maternal Grandmother        KIDNEY   Cancer Maternal Grandfather        COLON   Colon cancer Maternal Grandfather 70   Diabetes Daughter    Stomach cancer Neg Hx     BP (!) 142/90 (BP Location: Right Arm, Patient Position: Sitting, Cuff Size: Normal)   Pulse 74   Ht 5' 8" (1.727 m)   Wt 204 lb 12.8 oz (92.9 kg)   LMP 01/03/2012   SpO2 98%   BMI 31.14 kg/m    Review of Systems Denies nausea.   She has lost a few lbs.     Objective:   Physical Exam Pulses: dorsalis pedis intact bilat.   MSK: no deformity of the feet CV: no leg edema Skin:  no ulcer on the feet.  normal color and temp on the feet. Neuro: sensation is intact to touch on the feet.    A1c=6.6%     Assessment & Plan:  Type 2 DM: uncontrolled, as goal A1c is low 6's.   Patient Instructions  check your blood sugar twice a day.  vary the time of day when you check, between before the 3 meals, and at bedtime.  also check if you have symptoms of your blood sugar being too high or too low.  please keep a record of the readings and bring it to your next appointment here (or you can bring the meter itself).  You can write it on any piece of paper.  please call us sooner if your blood sugar goes below 70, or if you have a lot of readings over 200.   I have sent a prescription to your pharmacy, to increase the Trulicity.   Please come back for a follow-up appointment in 4 months.

## 2021-05-10 ENCOUNTER — Other Ambulatory Visit: Payer: Self-pay | Admitting: Endocrinology

## 2021-05-20 ENCOUNTER — Telehealth: Payer: Self-pay

## 2021-05-20 ENCOUNTER — Telehealth (INDEPENDENT_AMBULATORY_CARE_PROVIDER_SITE_OTHER): Payer: 59 | Admitting: Family

## 2021-05-20 ENCOUNTER — Ambulatory Visit: Payer: Self-pay

## 2021-05-20 ENCOUNTER — Other Ambulatory Visit: Payer: Self-pay

## 2021-05-20 ENCOUNTER — Telehealth: Payer: Self-pay | Admitting: Family

## 2021-05-20 DIAGNOSIS — U071 COVID-19: Secondary | ICD-10-CM | POA: Diagnosis not present

## 2021-05-20 MED ORDER — FLUTICASONE PROPIONATE 50 MCG/ACT NA SUSP: 2.0000 | Freq: Every day | NASAL | 0 refills | Status: DC

## 2021-05-20 MED ORDER — CEPACOL SORE THROAT 10-2.1 MG MT LOZG: 1.0000 | LOZENGE | OROMUCOSAL | 0 refills | Status: DC | PRN

## 2021-05-20 MED ORDER — GUAIFENESIN 200 MG PO TABS: 200.0000 mg | ORAL_TABLET | Freq: Four times a day (QID) | ORAL | 0 refills | Status: DC | PRN

## 2021-05-20 MED ORDER — IBUPROFEN 600 MG PO TABS: 600.0000 mg | ORAL_TABLET | Freq: Three times a day (TID) | ORAL | 0 refills | Status: DC | PRN

## 2021-05-20 NOTE — Progress Notes (Signed)
Pt presents for telemedicine visit for Covid positive at-home test symptoms include runny nose, chills, fever, vomiting and diarrhea

## 2021-05-20 NOTE — Progress Notes (Signed)
Virtual Visit via Telephone Note  I connected with [name], on 05/20/2021 at 2:18 PM by telephone due to the COVID-19 pandemic and verified that I am speaking with the correct person using two identifiers.  Due to current restrictions/limitations of in-office visits due to the COVID-19 pandemic, this scheduled clinical appointment was converted to a telehealth visit.   Consent: I discussed the limitations, risks, security and privacy concerns of performing an evaluation and management service by telephone and the availability of in person appointments. I also discussed with the patient that there may be a patient responsible charge related to this service. The patient expressed understanding and agreed to proceed.   Location of Patient: Home  Location of Provider: [facility] participating in Telemedicine visit: Amoreena Neubert [place] [name], NP Elmon Else, [place]   History of Present Illness: [name]. [name] is a 55 year-old female who presents for Covid-19.   Reports she tested positive for Covid 3 days ago with home test. She retested with home test again this morning which also resulted positive. Endorses runny nose, sore throat, congestion, body aches/pain, diarrhea, headache, cold chills and then becoming hot, fever 99.0 F. Denies shortness of breath and chest pain. She is eating usually chicken noodle soup. Taking over-the-counter Advil Sinus and Cold, and Nyquil without relief. She is vaccinated with Pfizer x 3.    Past Medical History:  Diagnosis Date   Acute meniscal tear of left knee    Diabetes mellitus without complication ([place])    History of hypothyroidism    Prediabetes 09/20/2017   Elevated blood glucose since 2013 possibly prior.   Allergies  Allergen Reactions   Adhesive [Tape] Rash    Current Outpatient Medications on File Prior to Visit  Medication Sig Dispense Refill   Accu-Chek FastClix Lancets  MISC USE FASTLIX LANCETS AS INSTRUCTED TO CHECK BLOOD SUGAR TWICE DAILY. DX:E11.59 102 each 3   ACCU-CHEK GUIDE test strip USE 1 EACH BY OTHER ROUTE 2 (TWO) TIMES DAILY. E11.9 200 strip 3   atorvastatin (LIPITOR) 20 MG tablet Take 1 tablet (20 mg total) by mouth daily. 90 tablet 0   BD ULTRA-FINE PEN NEEDLES 29G X 12.7MM MISC USE ONCE DAILY AS DIRECTED 100 each 3   Blood Glucose Monitoring Suppl (ACCU-CHEK GUIDE) w/Device KIT Please specify directions, refills and quantity (Patient taking differently: 1 each by Other route daily. E11.9) 1 kit 0   Dulaglutide (TRULICITY) 4.5 OF/1.2RF SOPN Inject 4.5 mg as directed once a week. 6 mL 3   [DISCONTINUED] fluticasone (FLONASE) 50 MCG/ACT nasal spray Place 1 spray into both nostrils daily. 16 g 2   No current facility-administered medications on file prior to visit.    Observations/Objective: Alert and oriented x 3. Not in acute distress. Physical examination not completed as this is a telemedicine visit.  Assessment and Plan: 1. COVID-19: - Ibuprofen, Guaifenesin, Benzocaine-Menthol, and Fluticasone as prescribed.  - Counseled to follow-up with primary provider in 5 to 7 days or sooner if needed if symptoms do not improve and/or worsen. - Patient was given clear instructions to go to Emergency Department or return to medical center if symptoms don't improve, worsen, or new problems develop.The patient verbalized understanding. - ibuprofen (ADVIL) 600 MG tablet; Take 1 tablet (600 mg total) by mouth every 8 (eight) hours as needed.  Dispense: 30 tablet; Refill: 0 - guaiFENesin 200 MG tablet; Take 1 tablet (200 mg total) by mouth every 6 (six)  hours as needed for cough or to loosen phlegm.  Dispense: 30 tablet; Refill: 0 - Benzocaine-Menthol (CEPACOL SORE THROAT) 10-2.1 MG LOZG; Use as directed 1 lozenge in the mouth or throat every 2 (two) hours as needed.  Dispense: 30 lozenge; Refill: 0 - fluticasone (FLONASE) 50 MCG/ACT nasal spray; Place 2 sprays  into both nostrils daily.  Dispense: 16 g; Refill: 0   Follow Up Instructions: Follow-up with primary provider as scheduled.   I discussed the assessment and treatment plan with the patient. The patient was provided an opportunity to ask questions and all were answered. The patient agreed with the plan and demonstrated an understanding of the instructions.   The patient was advised to call back or seek an in-person evaluation if the symptoms worsen or if the condition fails to improve as anticipated.   I provided 15 minutes total of non-face-to-face time during this encounter.   Camillia Herter, NP  [facility], [place] 05/20/2021, 2:18 PM

## 2021-05-20 NOTE — Telephone Encounter (Signed)
..  Ms. Heidi, West are scheduled for a virtual visit with your provider today.    Just as we do with appointments in the office, we must obtain your consent to participate.  Your consent will be active for this visit and any virtual visit you may have with one of our providers in the next 365 days.    If you have a MyChart account, I can also send a copy of this consent to you electronically.  All virtual visits are billed to your insurance company just like a traditional visit in the office.  As this is a virtual visit, video technology does not allow for your provider to perform a traditional examination.  This may limit your provider's ability to fully assess your condition.  If your provider identifies any concerns that need to be evaluated in person or the need to arrange testing such as labs, EKG, etc, we will make arrangements to do so.    Although advances in technology are sophisticated, we cannot ensure that it will always work on either your end or our end.  If the connection with a video visit is poor, we may have to switch to a telephone visit.  With either a video or telephone visit, we are not always able to ensure that we have a secure connection.   I need to obtain your verbal consent now.   Are you willing to proceed with your visit today?   [name] has provided verbal consent on 05/20/2021 for a virtual visit (video or telephone).   Elmon Else, [facility] 05/20/2021  2:12 PM

## 2021-05-20 NOTE — Telephone Encounter (Signed)
PT just tested positive and asking if any medication could be sent to [place].   Pt currently having a sore throat, body aches, runny nose and earaches.

## 2021-05-21 ENCOUNTER — Other Ambulatory Visit: Payer: Self-pay | Admitting: Family

## 2021-05-21 ENCOUNTER — Encounter: Payer: Self-pay | Admitting: Family

## 2021-05-21 DIAGNOSIS — U071 COVID-19: Secondary | ICD-10-CM

## 2021-05-21 MED ORDER — NIRMATRELVIR/RITONAVIR (PAXLOVID)TABLET: 3.0000 | ORAL_TABLET | Freq: Two times a day (BID) | ORAL | 0 refills | Status: AC

## 2021-05-23 ENCOUNTER — Other Ambulatory Visit: Payer: Self-pay | Admitting: Family

## 2021-05-23 ENCOUNTER — Encounter: Payer: Self-pay | Admitting: Family

## 2021-05-23 DIAGNOSIS — J029 Acute pharyngitis, unspecified: Secondary | ICD-10-CM

## 2021-05-23 MED ORDER — AMOXICILLIN 500 MG PO CAPS: 500.0000 mg | ORAL_CAPSULE | Freq: Two times a day (BID) | ORAL | 0 refills | Status: DC

## 2021-05-23 NOTE — Telephone Encounter (Signed)
Amoxicillin (Amoxil) prescribed for possible acute pharyngitis. In regards to immediate needs of evaluation and/or worsening symptoms please report to the nearest Emergency Department or Urgent Care.

## 2021-05-23 NOTE — Telephone Encounter (Signed)
Patient stated she tested negative for COVID this am 05/23/2021. Her throat is hurting really bad. Patient stated she had a phone visit with Amy on 05/20/2021 asking if something can be called in to the [facility] on [address].

## 2021-05-24 ENCOUNTER — Ambulatory Visit
Admission: RE | Admit: 2021-05-24 | Discharge: 2021-05-24 | Disposition: A | Discharge status: Home / Self Care | Payer: 59 | Source: Ambulatory Visit | Attending: Family Medicine | Admitting: Family Medicine

## 2021-05-24 ENCOUNTER — Other Ambulatory Visit: Payer: Self-pay

## 2021-05-24 VITALS — BP 138/100 | HR 94 | Temp 99.0°F | Ht 68.0 in | Wt 202.0 lb

## 2021-05-24 DIAGNOSIS — B370 Candidal stomatitis: Secondary | ICD-10-CM

## 2021-05-24 DIAGNOSIS — J029 Acute pharyngitis, unspecified: Secondary | ICD-10-CM | POA: Diagnosis not present

## 2021-05-24 DIAGNOSIS — U071 COVID-19: Secondary | ICD-10-CM | POA: Diagnosis not present

## 2021-05-24 MED ORDER — AMOXICILLIN 400 MG/5ML PO SUSR: 850.0000 mg | Freq: Two times a day (BID) | ORAL | 0 refills | Status: AC

## 2021-05-24 MED ORDER — NYSTATIN 100000 UNIT/ML MT SUSP: 500000.0000 [IU] | Freq: Four times a day (QID) | OROMUCOSAL | 0 refills | Status: DC

## 2021-05-24 MED ORDER — FLUCONAZOLE 40 MG/ML PO SUSR: 200.0000 mg | Freq: Every day | ORAL | 0 refills | Status: AC

## 2021-05-24 MED ORDER — DEXAMETHASONE SODIUM PHOSPHATE 10 MG/ML IJ SOLN
10.0000 mg | Freq: Once | INTRAMUSCULAR | Status: AC
  Administered 2021-05-24: 10 mg via INTRAMUSCULAR

## 2021-05-24 MED ORDER — LIDOCAINE VISCOUS HCL 2 % MT SOLN: 15.0000 mL | OROMUCOSAL | 0 refills | Status: DC | PRN

## 2021-05-24 NOTE — ED Triage Notes (Signed)
Patient states that she was diagnosed with COVID x 1 week ago.  Patient is now having a severe sore throat, tongue pain and the roof of her mouth x 3 days.  Patient was given Paxlovin and unsure if she has a reaction to that.  Patient unable to eat or drink due to severity of the pain.  Patient is vaccinated for COVID.

## 2021-05-24 NOTE — Discharge Instructions (Addendum)
Makes nystatin suspension and lidocaine together gargle and spit as needed for oral pain. Start Diflucan liquid suspension for oral thrush management. Start amoxicillin twice daily for 7 days for treatment of suspected acute bacterial pharyngitis. Continue to hydrate well with fluids. You were given a steroid shot here to reduce the swelling and inflammation of your throat.  Your blood sugars may be higher than baseline over the next 2 days due to steroids can cause sugar to increase therefore avoid eating foods high in carbs and drink extra amounts of water to reduce hyperglycemia.

## 2021-05-24 NOTE — ED Provider Notes (Signed)
[facility]    CSN: [id number] Arrival date & time: 05/24/21  0901      History   Chief Complaint Chief Complaint  Patient presents with   Sore Throat   Appointment    Sore Throat    HPI [name] is a 55 y.o. female.   HPI Patient diagnosed with COVID-19 over a week ago.  She has been treated with antiviral reports subsequently developing 3 days of gradually progressive soreness of throat pain with swallowing.  On arrival today patient has a low-grade fever.  She also suffers from type 2 diabetes.  Endorses rawness of her tongue with a thick white coating.  She is on day 3 of 5 antiviral therapy.  She has had no trouble swallowing any foods or beverages due to the swelling and soreness of her throat.  She started oral amoxicillin yesterday and was able to take 1 dose only.  Past Medical History:  Diagnosis Date   Acute meniscal tear of left knee    Diabetes mellitus without complication ([place])    History of hypothyroidism    Prediabetes 2017-10-15   Elevated blood glucose since 2012-01-04 possibly prior.    Patient Active Problem List   Diagnosis Date Noted   Chronic midline low back pain without sciatica 02/13/2018   Mixed diabetic hyperlipidemia associated with type 2 diabetes mellitus ([place]) 10/25/2017   Hypertension associated with diabetes ([place]) 10/25/2017   Diabetes mellitus, type II ([place]) 10/25/2017   H/O total hysterectomy- benign reasons 2017/10/15   Left knee pain- Dr Amedeo Plenty 15-Oct-2017   Perimenopausal 10-15-17   Family history of breast cancer in mother-deceased late 2023-01-03 Oct 15, 2017   Family history of colon cancer- MGF- in 60's 10-15-17   Blood glucose elevated 15-Oct-2017   Elevated alanine aminotransferase (ALT) level 2017/10/15   Low HDL (under 40) 10-15-2017   Severe obesity (BMI 35.0-35.9 with comorbidity) ([place]) Oct 15, 2017   Vitamin D deficiency Oct 15, 2017   Family history of diabetes mellitus (DM)- in daughter 2017/10/15    Hypothyroidism 02/08/2013    Past Surgical History:  Procedure Laterality Date   HYSTEROSCOPY W/ THERMACHOICE ENDOMETRIAL ABLATION  03-16-2007   KNEE ARTHROSCOPY Right    KNEE ARTHROSCOPY WITH MEDIAL MENISECTOMY Left 05/17/2015   Procedure: LEFT KNEE ARTHROSCOPY WITH MEDIAL MENISECTOMY, CHONDROPLASTY PATELLA;  Surgeon: Latanya Maudlin, MD;  Location: [facility];  Service: Orthopedics;  Laterality: Left;   LAPAROSOCPY BILATERAL TUBAL LIGATION W/ CAUTERIZATION  10-15-2000   VAGINAL HYSTERECTOMY  01/25/2012   Procedure: HYSTERECTOMY VAGINAL;  Surgeon: Anastasio Auerbach, MD;  Location: [facility];  Service: Gynecology;  Laterality: N/A;    OB History     Gravida  7   Para  4   Term      Preterm      AB  3   Living  4      SAB  3   IAB      Ectopic      Multiple      Live Births               Home Medications    Prior to Admission medications   Medication Sig Start Date End Date Taking? Authorizing Provider  Accu-Chek FastClix Lancets MISC USE FASTLIX LANCETS AS INSTRUCTED TO CHECK BLOOD SUGAR TWICE DAILY. DX:E11.59 05/13/21  Yes Renato Shin, MD  ACCU-CHEK GUIDE test strip USE 1 EACH BY OTHER ROUTE 2 (TWO) TIMES DAILY. E11.9 11/25/20  Yes Renato Shin, MD  amoxicillin (AMOXIL) 400  MG/5ML suspension Take 10.6 mLs (850 mg total) by mouth 2 (two) times daily for 7 days. 05/24/21 05/31/21 Yes Scot Jun, FNP  atorvastatin (LIPITOR) 20 MG tablet Take 1 tablet (20 mg total) by mouth daily. 02/15/21  Yes Minette Brine, Amy J, NP  BD ULTRA-FINE PEN NEEDLES 29G X 12.7MM MISC USE ONCE DAILY AS DIRECTED 11/25/20  Yes Renato Shin, MD  Benzocaine-Menthol (CEPACOL SORE THROAT) 10-2.1 MG LOZG Use as directed 1 lozenge in the mouth or throat every 2 (two) hours as needed. 05/20/21  Yes Minette Brine, Amy J, NP  Blood Glucose Monitoring Suppl (ACCU-CHEK GUIDE) w/Device KIT Please specify directions, refills and quantity Patient taking differently: 1 each by Other route daily.  E11.9 12/23/18  Yes Renato Shin, MD  Dulaglutide (TRULICITY) 4.5 LK/9.1PH SOPN Inject 4.5 mg as directed once a week. 04/18/21  Yes Renato Shin, MD  fluconazole (DIFLUCAN) 40 MG/ML suspension Take 5 mLs (200 mg total) by mouth daily for 5 days. 05/24/21 05/29/21 Yes Scot Jun, FNP  guaiFENesin 200 MG tablet Take 1 tablet (200 mg total) by mouth every 6 (six) hours as needed for cough or to loosen phlegm. 05/20/21  Yes Minette Brine, Amy J, NP  ibuprofen (ADVIL) 600 MG tablet Take 1 tablet (600 mg total) by mouth every 8 (eight) hours as needed. 05/20/21  Yes Minette Brine, Amy J, NP  lidocaine (XYLOCAINE) 2 % solution Use as directed 15 mLs in the mouth or throat every 4 (four) hours as needed for mouth pain. 05/24/21  Yes Scot Jun, FNP  nirmatrelvir/ritonavir EUA (PAXLOVID) TABS Take 3 tablets by mouth 2 (two) times daily for 5 days. (Take nirmatrelvir 150 mg two tablets twice daily for 5 days and ritonavir 100 mg one tablet twice daily for 5 days) Patient GFR is 73. 05/21/21 05/26/21 Yes Minette Brine, Amy J, NP  nystatin (MYCOSTATIN) 100000 UNIT/ML suspension Take 5 mLs (500,000 Units total) by mouth 4 (four) times daily. 05/24/21  Yes Scot Jun, FNP  fluticasone (FLONASE) 50 MCG/ACT nasal spray Place 2 sprays into both nostrils daily. 05/20/21   Camillia Herter, NP    Family History Family History  Problem Relation Age of Onset   Hypertension Mother    Cancer Mother        LUG, THROAT, & BACK   Breast cancer Mother    Cancer Father        THROAT   Throat cancer Father    Cancer Maternal Grandmother        KIDNEY   Cancer Maternal Grandfather        COLON   Colon cancer Maternal Grandfather 89   Diabetes Daughter    Stomach cancer Neg Hx     Social History Social History   Tobacco Use   Smoking status: Never   Smokeless tobacco: Never  Vaping Use   Vaping Use: Never used  Substance Use Topics   Alcohol use: No    Alcohol/week: 0.0 standard drinks   Drug use: No      Allergies   Adhesive [tape]   Review of Systems Review of Systems Pertinent negatives listed in HPI  Physical Exam Triage Vital Signs ED Triage Vitals  Enc Vitals Group     BP 05/24/21 0932 (!) 138/100     Pulse Rate 05/24/21 0932 94     Resp --      Temp 05/24/21 0932 99 F (37.2 C)     Temp Source 05/24/21 0932 Oral     SpO2  05/24/21 0932 96 %     Weight 05/24/21 0934 202 lb (91.6 kg)     Height 05/24/21 0934 _0  (1.727 m)     Head Circumference --      Peak Flow --      Pain Score 05/24/21 0934 10     Pain Loc --      Pain Edu? --      Excl. in [place]? --    No data found.  Updated Vital Signs BP (!) 138/100 (BP Location: Left Arm)   Pulse 94   Temp 99 F (37.2 C) (Oral)   Ht _1  (1.727 m)   Wt 202 lb (91.6 kg)   LMP 01/03/2012   SpO2 96%   BMI 30.71 kg/m   Visual Acuity Right Eye Distance:   Left Eye Distance:   Bilateral Distance:    Right Eye Near:   Left Eye Near:    Bilateral Near:     Physical Exam General Appearance:    Alert, acutely ill appearing, cooperative, no distress  HENT:   bilateral TM normal without fluid or infection, neck has bilateral anterior cervical nodes enlarged, pharynx erythematous without exudate, and white thick   Eyes:    PERRL, conjunctiva/corneas clear, EOM's intact       Lungs:     Clear to auscultation bilaterally, respirations unlabored  Heart:    Regular rate and rhythm  Neurologic:   Awake, alert, oriented x 3. No apparent focal  neurological deficit        UC Treatments / Results  Labs (all labs ordered are listed, but only abnormal results are displayed) Labs Reviewed - No data to display  EKG   Radiology No results found.  Procedures Procedures (including critical care time)  Medications Ordered in UC Medications  dexamethasone (DECADRON) injection 10 mg (has no administration in time range)    Initial Impression / Assessment and Plan / UC Course  I have reviewed the triage vital signs  and the nursing notes.  Pertinent labs & imaging results that were available during my care of the patient were reviewed by me and considered in my medical decision making (see chart for details).  Recent diagnosis of COVID-19 with subsequent development of acute pharyngitis and oral thrush.  Advised suspect the symptoms are secondary to recent COVID infection advised to continue oral antiviral medication.  Changed oral antibiotic into a liquid suspension for ease of swallowing.  Magic mouthwash prescribed and instructions for use given.  Patient given a Decadron IM injection here in clinic due to the severity of swelling and inflammation in her mouth and in her throat. Will defer from adding oral steroids to her normal regimen at home given current diabetes diagnosis.  Diflucan oral solution prescribed for oral thrush.  Return precautions given if symptoms worsen or do not improve Final Clinical Impressions(s) / UC Diagnoses   Final diagnoses:  Acute pharyngitis, unspecified etiology  Thrush, oral  COVID-19     Discharge Instructions      Makes nystatin suspension and lidocaine together gargle and spit as needed for oral pain. Start Diflucan liquid suspension for oral thrush management. Start amoxicillin twice daily for 7 days for treatment of suspected acute bacterial pharyngitis. Continue to hydrate well with fluids. You were given a steroid shot here to reduce the swelling and inflammation of your throat.  Your blood sugars may be higher than baseline over the next 2 days due to steroids can cause sugar to increase  therefore avoid eating foods high in carbs and drink extra amounts of water to reduce hyperglycemia.     ED Prescriptions     Medication Sig Dispense Auth. Provider   lidocaine (XYLOCAINE) 2 % solution Use as directed 15 mLs in the mouth or throat every 4 (four) hours as needed for mouth pain. 50 mL Scot Jun, FNP   nystatin (MYCOSTATIN) 100000 UNIT/ML  suspension Take 5 mLs (500,000 Units total) by mouth 4 (four) times daily. 60 mL Scot Jun, FNP   fluconazole (DIFLUCAN) 40 MG/ML suspension Take 5 mLs (200 mg total) by mouth daily for 5 days. 25 mL Scot Jun, FNP   amoxicillin (AMOXIL) 400 MG/5ML suspension Take 10.6 mLs (850 mg total) by mouth 2 (two) times daily for 7 days. 148.4 mL Scot Jun, FNP      PDMP not reviewed this encounter.   Scot Jun, FNP 05/24/21 1031

## 2021-08-22 ENCOUNTER — Other Ambulatory Visit: Payer: Self-pay

## 2021-08-22 ENCOUNTER — Ambulatory Visit: Payer: 59 | Admitting: Endocrinology

## 2021-08-22 VITALS — BP 114/64 | HR 72 | Ht 68.0 in | Wt 194.8 lb

## 2021-08-22 DIAGNOSIS — Z794 Long term (current) use of insulin: Secondary | ICD-10-CM | POA: Diagnosis not present

## 2021-08-22 DIAGNOSIS — E119 Type 2 diabetes mellitus without complications: Secondary | ICD-10-CM

## 2021-08-22 LAB — POCT GLYCOSYLATED HEMOGLOBIN (HGB A1C): Hemoglobin A1C: 7.5 % — AB (ref 4.0–5.6)

## 2021-08-22 NOTE — Patient Instructions (Addendum)
check your blood sugar twice a day.  vary the time of day when you check, between before the 3 meals, and at bedtime.  also check if you have symptoms of your blood sugar being too high or too low.  please keep a record of the readings and bring it to your next appointment here (or you can bring the meter itself).  You can write it on any piece of paper.  please call us sooner if your blood sugar goes below 70, or if you have a lot of readings over 200.   Please continue the same Trulicity.  Please come back for a follow-up appointment in 3 months.

## 2021-08-22 NOTE — Progress Notes (Signed)
Subjective:    Patient ID: [name], female    DOB: [birth year], 55 y.o.   MRN: [id number]  HPI Pt returns for f/u of diabetes mellitus:  DM type: 2   Dx'ed: 2263 Complications: none Therapy: Trulicity GDM: never DKA: never Severe hypoglycemia: never.  Pancreatitis: never Pancreatic imaging: never Other: she took insulin 2019-2021; she did not tolerate metformin (back pain).    Interval history: no cbg record, but states fasting cbg's are well-controlled.  pt states she feels well in general.  Pt says she never misses meds.   Past Medical History:  Diagnosis Date   Acute meniscal tear of left knee    Diabetes mellitus without complication ([place])    History of hypothyroidism    Prediabetes 09/20/2017   Elevated blood glucose since 2013 possibly prior.    Past Surgical History:  Procedure Laterality Date   HYSTEROSCOPY W/ THERMACHOICE ENDOMETRIAL ABLATION  03-16-2007   KNEE ARTHROSCOPY Right    KNEE ARTHROSCOPY WITH MEDIAL MENISECTOMY Left 05/17/2015   Procedure: LEFT KNEE ARTHROSCOPY WITH MEDIAL MENISECTOMY, CHONDROPLASTY PATELLA;  Surgeon: Latanya Maudlin, MD;  Location: [facility];  Service: Orthopedics;  Laterality: Left;   LAPAROSOCPY BILATERAL TUBAL LIGATION W/ CAUTERIZATION  10-15-2000   VAGINAL HYSTERECTOMY  01/25/2012   Procedure: HYSTERECTOMY VAGINAL;  Surgeon: Anastasio Auerbach, MD;  Location: [facility];  Service: Gynecology;  Laterality: N/A;    Social History   Socioeconomic History   Marital status: Married    Spouse name: Not on file   Number of children: Not on file   Years of education: Not on file   Highest education level: Not on file  Occupational History   Not on file  Tobacco Use   Smoking status: Never   Smokeless tobacco: Never  Vaping Use   Vaping Use: Never used  Substance and Sexual Activity   Alcohol use: No    Alcohol/week: 0.0 standard drinks   Drug use: No   Sexual activity: Not Currently    Comment: DES  NEG,DECLINED INSURANCE QUESTIONS  Other Topics Concern   Not on file  Social History Narrative   Not on file   Social Determinants of Health   Financial Resource Strain: Not on file  Food Insecurity: Not on file  Transportation Needs: Not on file  Physical Activity: Not on file  Stress: Not on file  Social Connections: Not on file  Intimate Partner Violence: Not on file    Current Outpatient Medications on File Prior to Visit  Medication Sig Dispense Refill   Accu-Chek FastClix Lancets MISC USE FASTLIX LANCETS AS INSTRUCTED TO [place]. DX:E11.59 102 each 3   ACCU-CHEK GUIDE test strip USE 1 EACH BY OTHER ROUTE 2 (TWO) TIMES DAILY. E11.9 200 strip 3   atorvastatin (LIPITOR) 20 MG tablet Take 1 tablet (20 mg total) by mouth daily. 90 tablet 0   BD ULTRA-FINE PEN NEEDLES 29G X 12.7MM MISC USE ONCE DAILY AS DIRECTED 100 each 3   Benzocaine-Menthol (CEPACOL SORE THROAT) 10-2.1 MG LOZG Use as directed 1 lozenge in the mouth or throat every 2 (two) hours as needed. 30 lozenge 0   Blood Glucose Monitoring Suppl (ACCU-CHEK GUIDE) w/Device KIT Please specify directions, refills and quantity (Patient taking differently: 1 each by Other route daily. E11.9) 1 kit 0   Dulaglutide (TRULICITY) 4.5 FH/5.4TG SOPN Inject 4.5 mg as directed once a week. 6 mL 3   fluticasone (FLONASE) 50 MCG/ACT nasal spray Place 2  sprays into both nostrils daily. 16 g 0   guaiFENesin 200 MG tablet Take 1 tablet (200 mg total) by mouth every 6 (six) hours as needed for cough or to loosen phlegm. 30 tablet 0   ibuprofen (ADVIL) 600 MG tablet Take 1 tablet (600 mg total) by mouth every 8 (eight) hours as needed. 30 tablet 0   lidocaine (XYLOCAINE) 2 % solution Use as directed 15 mLs in the mouth or throat every 4 (four) hours as needed for mouth pain. 50 mL 0   nystatin (MYCOSTATIN) 100000 UNIT/ML suspension Take 5 mLs (500,000 Units total) by mouth 4 (four) times daily. 60 mL 0   No current  facility-administered medications on file prior to visit.    Allergies  Allergen Reactions   Adhesive [Tape] Rash    Family History  Problem Relation Age of Onset   Hypertension Mother    Cancer Mother        LUG, THROAT, & BACK   Breast cancer Mother    Cancer Father        THROAT   Throat cancer Father    Cancer Maternal Grandmother        KIDNEY   Cancer Maternal Grandfather        COLON   Colon cancer Maternal Grandfather 71   Diabetes Daughter    Stomach cancer Neg Hx     BP 114/64 (BP Location: Right Arm, Patient Position: Sitting, Cuff Size: Large)   Pulse 72   Ht '5\' 8"'  (1.727 m)   Wt 194 lb 12.8 oz (88.4 kg)   LMP 01/03/2012   SpO2 96%   BMI 29.62 kg/m    Review of Systems Denies nausea    Objective:   Physical Exam   Lab Results  Component Value Date   CREATININE 0.93 02/14/2021   BUN 8 02/14/2021   NA 140 02/14/2021   K 4.0 02/14/2021   CL 102 02/14/2021   CO2 21 02/14/2021    A1c=7.5%    Assessment & Plan:  Type 2 DM: Uncontrolled.  I advised her to add [place], she she declines.    Patient Instructions  check your blood sugar twice a day.  vary the time of day when you check, between before the 3 meals, and at bedtime.  also check if you have symptoms of your blood sugar being too high or too low.  please keep a record of the readings and bring it to your next appointment here (or you can bring the meter itself).  You can write it on any piece of paper.  please call us sooner if your blood sugar goes below 70, or if you have a lot of readings over 200.   Please continue the same Trulicity.  Please come back for a follow-up appointment in 3 months.

## 2021-09-16 ENCOUNTER — Other Ambulatory Visit: Payer: Self-pay | Admitting: Family

## 2021-09-16 DIAGNOSIS — Z1231 Encounter for screening mammogram for malignant neoplasm of breast: Secondary | ICD-10-CM

## 2021-09-17 ENCOUNTER — Ambulatory Visit
Admission: RE | Admit: 2021-09-17 | Discharge: 2021-09-17 | Disposition: A | Discharge status: Home / Self Care | Payer: 59 | Source: Ambulatory Visit | Attending: Family | Admitting: Family

## 2021-09-17 ENCOUNTER — Other Ambulatory Visit: Payer: Self-pay

## 2021-09-17 DIAGNOSIS — Z1231 Encounter for screening mammogram for malignant neoplasm of breast: Secondary | ICD-10-CM

## 2021-09-19 NOTE — Progress Notes (Signed)
No mammogram evidence of malignancy. Repeat mammogram in 1 year.

## 2021-11-28 ENCOUNTER — Ambulatory Visit: Payer: 59 | Admitting: Endocrinology

## 2021-12-12 ENCOUNTER — Ambulatory Visit: Payer: 59 | Admitting: Endocrinology

## 2021-12-12 ENCOUNTER — Other Ambulatory Visit: Payer: Self-pay

## 2021-12-12 VITALS — BP 120/90 | HR 85 | Ht 68.0 in | Wt 194.2 lb

## 2021-12-12 DIAGNOSIS — Z794 Long term (current) use of insulin: Secondary | ICD-10-CM

## 2021-12-12 DIAGNOSIS — E119 Type 2 diabetes mellitus without complications: Secondary | ICD-10-CM | POA: Diagnosis not present

## 2021-12-12 LAB — POCT GLYCOSYLATED HEMOGLOBIN (HGB A1C): Hemoglobin A1C: 5.8 % — AB (ref 4.0–5.6)

## 2021-12-12 NOTE — Patient Instructions (Addendum)
check your blood sugar twice a day.  vary the time of day when you check, between before the 3 meals, and at bedtime.  also check if you have symptoms of your blood sugar being too high or too low.  please keep a record of the readings and bring it to your next appointment here (or you can bring the meter itself).  You can write it on any piece of paper.  please call us sooner if your blood sugar goes below 70, or if you have a lot of readings over 200.   Please continue the same Trulicity.   Please come back for a follow-up appointment in May.

## 2021-12-12 NOTE — Progress Notes (Signed)
° °Subjective:  ° ° Patient ID: [name], female    DOB: 06/07/1966, 55 y.o.   MRN: [id number] ° °HPI °Pt returns for f/u of diabetes mellitus:  °DM type: 2   °Dx'ed: 2018 °Complications: none °Therapy: Trulicity °GDM: never °DKA: never °Severe hypoglycemia: never.  °Pancreatitis: never °Pancreatic imaging: never.   °Other: she took insulin 2019-2021; she did not tolerate metformin (back pain).    °Interval history: no cbg record, but states fasting cbg's are well-controlled.  pt states she feels well in general.  Pt says she never misses meds.  °Past Medical History:  °Diagnosis Date  ° Acute meniscal tear of left knee   ° Diabetes mellitus without complication (HCC)   ° History of hypothyroidism   ° Prediabetes 09/20/2017  ° Elevated blood glucose since 2013 possibly prior.  ° ° °Past Surgical History:  °Procedure Laterality Date  ° HYSTEROSCOPY W/ THERMACHOICE ENDOMETRIAL ABLATION  03-16-2007  ° KNEE ARTHROSCOPY Right   ° KNEE ARTHROSCOPY WITH MEDIAL MENISECTOMY Left 05/17/2015  ° Procedure: LEFT KNEE ARTHROSCOPY WITH MEDIAL MENISECTOMY, CHONDROPLASTY PATELLA;  Surgeon: Ronald Gioffre, MD;  Location: [facility];  Service: Orthopedics;  Laterality: Left;  ° LAPAROSOCPY BILATERAL TUBAL LIGATION W/ CAUTERIZATION  10-15-2000  ° VAGINAL HYSTERECTOMY  01/25/2012  ° Procedure: HYSTERECTOMY VAGINAL;  Surgeon: Timothy P Fontaine, MD;  Location: [facility];  Service: Gynecology;  Laterality: N/A;  ° ° °Social History  ° °Socioeconomic History  ° Marital status: Married  °  Spouse name: Not on file  ° Number of children: Not on file  ° Years of education: Not on file  ° Highest education level: Not on file  °Occupational History  ° Not on file  °Tobacco Use  ° Smoking status: Never  ° Smokeless tobacco: Never  °Vaping Use  ° Vaping Use: Never used  °Substance and Sexual Activity  ° Alcohol use: No  °  Alcohol/week: 0.0 standard drinks  ° Drug use: No  ° Sexual activity: Not Currently  °  Comment: DES  NEG,DECLINED INSURANCE QUESTIONS  °Other Topics Concern  ° Not on file  °Social History Narrative  ° Not on file  ° °Social Determinants of Health  ° °Financial Resource Strain: Not on file  °Food Insecurity: Not on file  °Transportation Needs: Not on file  °Physical Activity: Not on file  °Stress: Not on file  °Social Connections: Not on file  °Intimate Partner Violence: Not on file  ° ° °Current Outpatient Medications on File Prior to Visit  °Medication Sig Dispense Refill  ° Accu-Chek FastClix Lancets MISC USE FASTLIX LANCETS AS INSTRUCTED TO CHECK BLOOD SUGAR TWICE DAILY. DX:E11.59 102 each 3  ° ACCU-CHEK GUIDE test strip USE 1 EACH BY OTHER ROUTE 2 (TWO) TIMES DAILY. E11.9 200 strip 3  ° atorvastatin (LIPITOR) 20 MG tablet Take 1 tablet (20 mg total) by mouth daily. 90 tablet 0  ° BD ULTRA-FINE PEN NEEDLES 29G X 12.7MM MISC USE ONCE DAILY AS DIRECTED 100 each 3  ° Benzocaine-Menthol (CEPACOL SORE THROAT) 10-2.1 MG LOZG Use as directed 1 lozenge in the mouth or throat every 2 (two) hours as needed. 30 lozenge 0  ° Blood Glucose Monitoring Suppl (ACCU-CHEK GUIDE) w/Device KIT Please specify directions, refills and quantity (Patient taking differently: 1 each by Other route daily. E11.9) 1 kit 0  ° Dulaglutide (TRULICITY) 4.5 MG/0.5ML SOPN Inject 4.5 mg as directed once a week. 6 mL 3  ° fluticasone (FLONASE) 50 MCG/ACT nasal spray Place   2 sprays into both nostrils daily. 16 g 0  ° guaiFENesin 200 MG tablet Take 1 tablet (200 mg total) by mouth every 6 (six) hours as needed for cough or to loosen phlegm. 30 tablet 0  ° ibuprofen (ADVIL) 600 MG tablet Take 1 tablet (600 mg total) by mouth every 8 (eight) hours as needed. 30 tablet 0  ° lidocaine (XYLOCAINE) 2 % solution Use as directed 15 mLs in the mouth or throat every 4 (four) hours as needed for mouth pain. 50 mL 0  ° nystatin (MYCOSTATIN) 100000 UNIT/ML suspension Take 5 mLs (500,000 Units total) by mouth 4 (four) times daily. 60 mL 0  ° °No current  facility-administered medications on file prior to visit.  ° ° °Allergies  °Allergen Reactions  ° Adhesive [Tape] Rash  ° ° °Family History  °Problem Relation Age of Onset  ° Hypertension Mother   ° Cancer Mother   °     LUG, THROAT, & BACK  ° Breast cancer Mother   ° Cancer Father   °     THROAT  ° Throat cancer Father   ° Cancer Maternal Grandmother   °     KIDNEY  ° Cancer Maternal Grandfather   °     COLON  ° Colon cancer Maternal Grandfather 70  ° Diabetes Daughter   ° Stomach cancer Neg Hx   ° ° °BP 120/90    Pulse 85    Ht 5' 8" (1.727 m)    Wt 194 lb 3.2 oz (88.1 kg)    LMP 01/03/2012    SpO2 95%    BMI 29.53 kg/m²  ° ° °Review of Systems °Denies N/V/HB/bloating.   °   °Objective:  ° Physical Exam ° ° ° ° °Lab Results  °Component Value Date  ° HGBA1C 5.8 (A) 12/12/2021  ° °   °Assessment & Plan:  °Type 2 DM: well-controlled.  ° °Patient Instructions  °check your blood sugar twice a day.  vary the time of day when you check, between before the 3 meals, and at bedtime.  also check if you have symptoms of your blood sugar being too high or too low.  please keep a record of the readings and bring it to your next appointment here (or you can bring the meter itself).  You can write it on any piece of paper.  please call us sooner if your blood sugar goes below 70, or if you have a lot of readings over 200.   °Please continue the same Trulicity.   °Please come back for a follow-up appointment in May.   ° ° ° °

## 2021-12-18 ENCOUNTER — Ambulatory Visit: Payer: 59 | Admitting: Nurse Practitioner

## 2021-12-28 ENCOUNTER — Other Ambulatory Visit: Payer: Self-pay

## 2021-12-28 ENCOUNTER — Ambulatory Visit
Admission: RE | Admit: 2021-12-28 | Discharge: 2021-12-28 | Disposition: A | Discharge status: Home / Self Care | Payer: 59 | Source: Ambulatory Visit | Attending: Physician Assistant | Admitting: Physician Assistant

## 2021-12-28 VITALS — BP 123/79 | HR 96 | Temp 98.6°F | Resp 18

## 2021-12-28 DIAGNOSIS — J020 Streptococcal pharyngitis: Secondary | ICD-10-CM

## 2021-12-28 LAB — POCT RAPID STREP A (OFFICE): Rapid Strep A Screen: POSITIVE — AB

## 2021-12-28 MED ORDER — AMOXICILLIN 500 MG PO CAPS: 500.0000 mg | ORAL_CAPSULE | Freq: Three times a day (TID) | ORAL | 0 refills | Status: DC

## 2021-12-28 NOTE — ED Provider Notes (Signed)
[facility]    CSN: [id number] Arrival date & time: 12/28/21  1037      History   Chief Complaint Chief Complaint  Patient presents with   Nasal Congestion   Appointment    1100    HPI [name] is a 56 y.o. female.   Patient here today for evaluation of nasal congestion and sore throat she has had for 3 days.  She reports that she has had minimal cough.  She has not had any fever.  She denies any nausea vomiting or diarrhea.  She has been taking over-the-counter medication which seems to help with congestion but is not helping with sore throat.  The history is provided by the patient.   Past Medical History:  Diagnosis Date   Acute meniscal tear of left knee    Diabetes mellitus without complication ([place])    History of hypothyroidism    Prediabetes 01-Oct-2017   Elevated blood glucose since 01/08/12 possibly prior.    Patient Active Problem List   Diagnosis Date Noted   Chronic midline low back pain without sciatica 02/13/2018   Mixed diabetic hyperlipidemia associated with type 2 diabetes mellitus ([place]) 10/25/2017   Hypertension associated with diabetes ([place]) 10/25/2017   Diabetes mellitus, type II ([place]) 10/25/2017   H/O total hysterectomy- benign reasons 10-01-2017   Left knee pain- Dr Amedeo Plenty Oct 01, 2017   Perimenopausal 2017/10/01   Family history of breast cancer in mother-deceased late 01/08/2023 2017/10/01   Family history of colon cancer- MGF- in 60's 2017/10/01   Blood glucose elevated 10/01/17   Elevated alanine aminotransferase (ALT) level 2017/10/01   Low HDL (under 40) 10/01/17   Severe obesity (BMI 35.0-35.9 with comorbidity) ([place]) Oct 01, 2017   Vitamin D deficiency 10/01/2017   Family history of diabetes mellitus (DM)- in daughter 01-Oct-2017   Hypothyroidism 02/08/2013    Past Surgical History:  Procedure Laterality Date   HYSTEROSCOPY W/ THERMACHOICE ENDOMETRIAL ABLATION  03-16-2007   KNEE ARTHROSCOPY Right    KNEE ARTHROSCOPY WITH  MEDIAL MENISECTOMY Left 05/17/2015   Procedure: LEFT KNEE ARTHROSCOPY WITH MEDIAL MENISECTOMY, CHONDROPLASTY PATELLA;  Surgeon: Latanya Maudlin, MD;  Location: [facility];  Service: Orthopedics;  Laterality: Left;   LAPAROSOCPY BILATERAL TUBAL LIGATION W/ CAUTERIZATION  10-15-2000   VAGINAL HYSTERECTOMY  01/25/2012   Procedure: HYSTERECTOMY VAGINAL;  Surgeon: Anastasio Auerbach, MD;  Location: [facility];  Service: Gynecology;  Laterality: N/A;    OB History     Gravida  7   Para  4   Term      Preterm      AB  3   Living  4      SAB  3   IAB      Ectopic      Multiple      Live Births               Home Medications    Prior to Admission medications   Medication Sig Start Date End Date Taking? Authorizing Provider  amoxicillin (AMOXIL) 500 MG capsule Take 1 capsule (500 mg total) by mouth 3 (three) times daily. 12/28/21  Yes Francene Finders, PA-C  Accu-Chek FastClix Lancets MISC USE FASTLIX LANCETS AS INSTRUCTED TO CHECK BLOOD SUGAR TWICE DAILY. DX:E11.59 05/13/21   Renato Shin, MD  ACCU-CHEK GUIDE test strip USE 1 EACH BY OTHER ROUTE 2 (TWO) TIMES DAILY. E11.9 11/25/20   Renato Shin, MD  atorvastatin (LIPITOR) 20 MG tablet Take 1 tablet (20 mg total)  by mouth daily. 02/15/21   Camillia Herter, NP  BD ULTRA-FINE PEN NEEDLES 29G X 12.7MM MISC USE ONCE DAILY AS DIRECTED 11/25/20   Renato Shin, MD  Benzocaine-Menthol (CEPACOL SORE THROAT) 10-2.1 MG LOZG Use as directed 1 lozenge in the mouth or throat every 2 (two) hours as needed. 05/20/21   Camillia Herter, NP  Blood Glucose Monitoring Suppl (ACCU-CHEK GUIDE) w/Device KIT Please specify directions, refills and quantity Patient taking differently: 1 each by Other route daily. E11.9 12/23/18   Renato Shin, MD  Dulaglutide (TRULICITY) 4.5 WU/9.8JX SOPN Inject 4.5 mg as directed once a week. 04/18/21   Renato Shin, MD  fluticasone [facility]) 50 MCG/ACT nasal spray Place 2 sprays into both nostrils daily.  05/20/21   Camillia Herter, NP  guaiFENesin 200 MG tablet Take 1 tablet (200 mg total) by mouth every 6 (six) hours as needed for cough or to loosen phlegm. 05/20/21   Camillia Herter, NP  ibuprofen (ADVIL) 600 MG tablet Take 1 tablet (600 mg total) by mouth every 8 (eight) hours as needed. 05/20/21   Camillia Herter, NP  lidocaine (XYLOCAINE) 2 % solution Use as directed 15 mLs in the mouth or throat every 4 (four) hours as needed for mouth pain. 05/24/21   Scot Jun, FNP  nystatin (MYCOSTATIN) 100000 UNIT/ML suspension Take 5 mLs (500,000 Units total) by mouth 4 (four) times daily. 05/24/21   Scot Jun, FNP    Family History Family History  Problem Relation Age of Onset   Hypertension Mother    Cancer Mother        LUG, THROAT, & BACK   Breast cancer Mother    Cancer Father        THROAT   Throat cancer Father    Cancer Maternal Grandmother        KIDNEY   Cancer Maternal Grandfather        COLON   Colon cancer Maternal Grandfather 14   Diabetes Daughter    Stomach cancer Neg Hx     Social History Social History   Tobacco Use   Smoking status: Never   Smokeless tobacco: Never  Vaping Use   Vaping Use: Never used  Substance Use Topics   Alcohol use: No    Alcohol/week: 0.0 standard drinks   Drug use: No     Allergies   Adhesive [tape]   Review of Systems Review of Systems  Constitutional:  Negative for chills and fever.  HENT:  Positive for congestion and sore throat. Negative for ear pain.   Eyes:  Negative for discharge and redness.  Respiratory:  Positive for cough. Negative for shortness of breath and wheezing.   Gastrointestinal:  Negative for abdominal pain, diarrhea, nausea and vomiting.    Physical Exam Triage Vital Signs ED Triage Vitals [12/28/21 1116]  Enc Vitals Group     BP 123/79     Pulse Rate 96     Resp 18     Temp 98.6 F (37 C)     Temp Source Oral     SpO2 96 %     Weight      Height      Head Circumference       Peak Flow      Pain Score 2     Pain Loc      Pain Edu?      Excl. in [place]?    No data found.  Updated Vital Signs BP  123/79 (BP Location: Left Arm)    Pulse 96    Temp 98.6 F (37 C) (Oral)    Resp 18    LMP 01/03/2012    SpO2 96%      Physical Exam Vitals and nursing note reviewed.  Constitutional:      General: She is not in acute distress.    Appearance: Normal appearance. She is not ill-appearing.  HENT:     Head: Normocephalic and atraumatic.     Nose: Congestion present.     Mouth/Throat:     Mouth: Mucous membranes are moist.     Pharynx: Oropharyngeal exudate and posterior oropharyngeal erythema present.  Eyes:     Conjunctiva/sclera: Conjunctivae normal.  Cardiovascular:     Rate and Rhythm: Normal rate.  Pulmonary:     Effort: Pulmonary effort is normal. No respiratory distress.  Skin:    General: Skin is warm and dry.  Neurological:     Mental Status: She is alert.  Psychiatric:        Mood and Affect: Mood normal.        Thought Content: Thought content normal.     UC Treatments / Results  Labs (all labs ordered are listed, but only abnormal results are displayed) Labs Reviewed  POCT RAPID STREP A (OFFICE) - Abnormal; Notable for the following components:      Result Value   Rapid Strep A Screen Positive (*)    All other components within normal limits    EKG   Radiology No results found.  Procedures Procedures (including critical care time)  Medications Ordered in UC Medications - No data to display  Initial Impression / Assessment and Plan / UC Course  I have reviewed the triage vital signs and the nursing notes.  Pertinent labs & imaging results that were available during my care of the patient were reviewed by me and considered in my medical decision making (see chart for details).    Strep test positive in office.  Will treat with amoxicillin and recommended follow-up if symptoms fail to improve or worsen.  Final Clinical  Impressions(s) / UC Diagnoses   Final diagnoses:  Streptococcal sore throat   Discharge Instructions   None    ED Prescriptions     Medication Sig Dispense Auth. Provider   amoxicillin (AMOXIL) 500 MG capsule Take 1 capsule (500 mg total) by mouth 3 (three) times daily. 21 capsule Francene Finders, PA-C      PDMP not reviewed this encounter.   Francene Finders, PA-C 12/28/21 1152

## 2021-12-28 NOTE — ED Triage Notes (Signed)
Pt here for nasal congestion  and sore throat x 3 days 

## 2021-12-28 NOTE — ED Provider Notes (Incomplete)
[facility]    CSN: [id number] Arrival date & time: 12/28/21  01-01-36      History   Chief Complaint No chief complaint on file.   HPI [name] is a 56 y.o. female.   HPI  Past Medical History:  Diagnosis Date   Acute meniscal tear of left knee    Diabetes mellitus without complication ([place])    History of hypothyroidism    Prediabetes 2017-09-24   Elevated blood glucose since 01/01/12 possibly prior.    Patient Active Problem List   Diagnosis Date Noted   Chronic midline low back pain without sciatica 02/13/2018   Mixed diabetic hyperlipidemia associated with type 2 diabetes mellitus ([place]) 10/25/2017   Hypertension associated with diabetes ([place]) 10/25/2017   Diabetes mellitus, type II ([place]) 10/25/2017   H/O total hysterectomy- benign reasons 24-Sep-2017   Left knee pain- Dr Amedeo Plenty 09-24-17   Perimenopausal Sep 24, 2017   Family history of breast cancer in mother-deceased late 01-Jan-2023 09-24-2017   Family history of colon cancer- MGF- in 60's 09-24-17   Blood glucose elevated 2017/09/24   Elevated alanine aminotransferase (ALT) level 09-24-2017   Low HDL (under 40) 09-24-17   Severe obesity (BMI 35.0-35.9 with comorbidity) ([place]) 09-24-17   Vitamin D deficiency Sep 24, 2017   Family history of diabetes mellitus (DM)- in daughter 09/24/17   Hypothyroidism 02/08/2013    Past Surgical History:  Procedure Laterality Date   HYSTEROSCOPY W/ THERMACHOICE ENDOMETRIAL ABLATION  03-16-2007   KNEE ARTHROSCOPY Right    KNEE ARTHROSCOPY WITH MEDIAL MENISECTOMY Left 05/17/2015   Procedure: LEFT KNEE ARTHROSCOPY WITH MEDIAL MENISECTOMY, CHONDROPLASTY PATELLA;  Surgeon: Latanya Maudlin, MD;  Location: [place];  Service: Orthopedics;  Laterality: Left;   LAPAROSOCPY BILATERAL TUBAL LIGATION W/ CAUTERIZATION  10-15-2000   VAGINAL HYSTERECTOMY  01/25/2012   Procedure: HYSTERECTOMY VAGINAL;  Surgeon: Anastasio Auerbach, MD;   Location: [facility];  Service: Gynecology;  Laterality: N/A;    OB History     Gravida  7   Para  4   Term      Preterm      AB  3   Living  4      SAB  3   IAB      Ectopic      Multiple      Live Births               Home Medications    Prior to Admission medications   Medication Sig Start Date End Date Taking? Authorizing Provider  Accu-Chek FastClix Lancets MISC USE FASTLIX LANCETS AS INSTRUCTED TO CHECK BLOOD SUGAR TWICE DAILY. DX:E11.59 05/13/21   Renato Shin, MD  ACCU-CHEK GUIDE test strip USE 1 EACH BY OTHER ROUTE 2 (TWO) TIMES DAILY. E11.9 11/25/20   Renato Shin, MD  atorvastatin (LIPITOR) 20 MG tablet Take 1 tablet (20 mg total) by mouth daily. 02/15/21   Camillia Herter, NP  BD ULTRA-FINE PEN NEEDLES 29G X 12.7MM MISC USE ONCE DAILY AS DIRECTED 11/25/20   Renato Shin, MD  Benzocaine-Menthol (CEPACOL SORE THROAT) 10-2.1 MG LOZG Use as directed 1 lozenge in the mouth or throat every 2 (two) hours as needed. 05/20/21   Camillia Herter, NP  Blood Glucose Monitoring Suppl (ACCU-CHEK GUIDE) w/Device KIT Please specify directions, refills and quantity Patient taking differently: 1 each by Other route daily. E11.9 12/23/18   Renato Shin, MD  Dulaglutide (TRULICITY) 4.5 GY/6.9SW SOPN Inject 4.5 mg as directed once a week. 04/18/21  Renato Shin, MD  fluticasone [facility]) 50 MCG/ACT nasal spray Place 2 sprays into both nostrils daily. 05/20/21   Camillia Herter, NP  guaiFENesin 200 MG tablet Take 1 tablet (200 mg total) by mouth every 6 (six) hours as needed for cough or to loosen phlegm. 05/20/21   Camillia Herter, NP  ibuprofen (ADVIL) 600 MG tablet Take 1 tablet (600 mg total) by mouth every 8 (eight) hours as needed. 05/20/21   Camillia Herter, NP  lidocaine (XYLOCAINE) 2 % solution Use as directed 15 mLs in the mouth or throat every 4 (four) hours as needed for mouth pain. 05/24/21   Scot Jun, FNP  nystatin (MYCOSTATIN) 100000 UNIT/ML suspension Take 5  mLs (500,000 Units total) by mouth 4 (four) times daily. 05/24/21   Scot Jun, FNP    Family History Family History  Problem Relation Age of Onset   Hypertension Mother    Cancer Mother        LUG, THROAT, & BACK   Breast cancer Mother    Cancer Father        THROAT   Throat cancer Father    Cancer Maternal Grandmother        KIDNEY   Cancer Maternal Grandfather        COLON   Colon cancer Maternal Grandfather 22   Diabetes Daughter    Stomach cancer Neg Hx     Social History Social History   Tobacco Use   Smoking status: Never   Smokeless tobacco: Never  Vaping Use   Vaping Use: Never used  Substance Use Topics   Alcohol use: No    Alcohol/week: 0.0 standard drinks   Drug use: No     Allergies   Adhesive [tape]   Review of Systems Review of Systems  Constitutional:  Negative for chills and fever.  HENT:  Positive for congestion, sinus pressure and sore throat. Negative for ear pain.   Eyes:  Negative for discharge and redness.  Respiratory:  Positive for cough. Negative for shortness of breath and wheezing.   Gastrointestinal:  Negative for abdominal pain, diarrhea, nausea and vomiting.    Physical Exam Triage Vital Signs ED Triage Vitals  Enc Vitals Group     BP      Pulse      Resp      Temp      Temp src      SpO2      Weight      Height      Head Circumference      Peak Flow      Pain Score      Pain Loc      Pain Edu?      Excl. in [place]?    No data found.  Updated Vital Signs LMP 01/03/2012       Physical Exam Vitals and nursing note reviewed.  Constitutional:      General: She is not in acute distress.    Appearance: Normal appearance. She is not ill-appearing.  HENT:     Head: Normocephalic and atraumatic.     Right Ear: Tympanic membrane normal.     Left Ear: Tympanic membrane normal.     Nose: Congestion present.     Mouth/Throat:     Mouth: Mucous membranes are moist.     Pharynx: No oropharyngeal  exudate or posterior oropharyngeal erythema.  Eyes:     Conjunctiva/sclera: Conjunctivae normal.  Cardiovascular:  Rate and Rhythm: Normal rate and regular rhythm.     Heart sounds: Normal heart sounds. No murmur heard. Pulmonary:     Effort: Pulmonary effort is normal. No respiratory distress.     Breath sounds: Normal breath sounds. No wheezing, rhonchi or rales.  Skin:    General: Skin is warm and dry.  Neurological:     Mental Status: She is alert.  Psychiatric:        Mood and Affect: Mood normal.        Thought Content: Thought content normal.     UC Treatments / Results  Labs (all labs ordered are listed, but only abnormal results are displayed) Labs Reviewed - No data to display  EKG   Radiology No results found.  Procedures Procedures (including critical care time)  Medications Ordered in UC Medications - No data to display  Initial Impression / Assessment and Plan / UC Course  I have reviewed the triage vital signs and the nursing notes.  Pertinent labs & imaging results that were available during my care of the patient were reviewed by me and considered in my medical decision making (see chart for details).     *** Final Clinical Impressions(s) / UC Diagnoses   Final diagnoses:  None   Discharge Instructions   None    ED Prescriptions   None    PDMP not reviewed this encounter.

## 2022-02-03 ENCOUNTER — Other Ambulatory Visit: Payer: Self-pay | Admitting: Endocrinology

## 2022-02-03 DIAGNOSIS — E119 Type 2 diabetes mellitus without complications: Secondary | ICD-10-CM

## 2022-02-23 ENCOUNTER — Ambulatory Visit: Payer: 59 | Admitting: Nurse Practitioner

## 2022-03-06 ENCOUNTER — Ambulatory Visit (INDEPENDENT_AMBULATORY_CARE_PROVIDER_SITE_OTHER): Payer: 59 | Admitting: Family Medicine

## 2022-03-06 ENCOUNTER — Encounter: Payer: Self-pay | Admitting: Family Medicine

## 2022-03-06 VITALS — BP 127/88 | HR 75 | Temp 98.1°F | Resp 16 | Ht 69.0 in | Wt 194.0 lb

## 2022-03-06 DIAGNOSIS — E119 Type 2 diabetes mellitus without complications: Secondary | ICD-10-CM | POA: Diagnosis not present

## 2022-03-06 DIAGNOSIS — Z0001 Encounter for general adult medical examination with abnormal findings: Secondary | ICD-10-CM

## 2022-03-06 DIAGNOSIS — Z794 Long term (current) use of insulin: Secondary | ICD-10-CM | POA: Diagnosis not present

## 2022-03-06 DIAGNOSIS — Z13220 Encounter for screening for lipoid disorders: Secondary | ICD-10-CM

## 2022-03-06 DIAGNOSIS — Z130 Encounter for screening for diseases of the blood and blood-forming organs and certain disorders involving the immune mechanism: Secondary | ICD-10-CM

## 2022-03-06 DIAGNOSIS — Z0000 Encounter for general adult medical examination without abnormal findings: Secondary | ICD-10-CM

## 2022-03-06 LAB — POCT GLYCOSYLATED HEMOGLOBIN (HGB A1C): Hemoglobin A1C: 5.9 % — AB (ref 4.0–5.6)

## 2022-03-06 NOTE — Progress Notes (Signed)
? ?Established Patient Office Visit ? ?Subjective   ? ?Patient ID: [name], female    DOB: [birth year]  Age: 56 y.o. MRN: [id number] ? ?CC:  ?Chief Complaint  ?Patient presents with  ? Follow-up  ? Diabetes  ? ? ?HPI ?[name] presents for routine annual exam. Denies acute complaints.  ? ? ?Outpatient Encounter Medications as of 03/06/2022  ?Medication Sig  ? Dulaglutide (TRULICITY) 4.5 GY/6.5LD SOPN Inject 4.5 mg as directed once a week.  ? Accu-Chek FastClix Lancets MISC USE FASTLIX LANCETS AS INSTRUCTED TO CHECK BLOOD SUGAR TWICE DAILY. DX:E11.59  ? ACCU-CHEK GUIDE test strip USE 1 EACH BY OTHER ROUTE 2 (TWO) TIMES DAILY. E11.9  ? amoxicillin (AMOXIL) 500 MG capsule Take 1 capsule (500 mg total) by mouth 3 (three) times daily. (Patient not taking: Reported on 03/06/2022)  ? BD ULTRA-FINE PEN NEEDLES 29G X 12.7MM MISC USE ONCE DAILY AS DIRECTED  ? Blood Glucose Monitoring Suppl (ACCU-CHEK GUIDE) w/Device KIT Please specify directions, refills and quantity (Patient taking differently: 1 each by Other route daily. E11.9)  ? [DISCONTINUED] atorvastatin (LIPITOR) 20 MG tablet Take 1 tablet (20 mg total) by mouth daily. (Patient not taking: Reported on 03/06/2022)  ? [DISCONTINUED] Benzocaine-Menthol (CEPACOL SORE THROAT) 10-2.1 MG LOZG Use as directed 1 lozenge in the mouth or throat every 2 (two) hours as needed. (Patient not taking: Reported on 03/06/2022)  ? [DISCONTINUED] fluticasone (FLONASE) 50 MCG/ACT nasal spray Place 2 sprays into both nostrils daily. (Patient not taking: Reported on 03/06/2022)  ? [DISCONTINUED] guaiFENesin 200 MG tablet Take 1 tablet (200 mg total) by mouth every 6 (six) hours as needed for cough or to loosen phlegm. (Patient not taking: Reported on 03/06/2022)  ? [DISCONTINUED] ibuprofen (ADVIL) 600 MG tablet Take 1 tablet (600 mg total) by mouth every 8 (eight) hours as needed. (Patient not taking: Reported on 03/06/2022)  ? [DISCONTINUED] lidocaine (XYLOCAINE) 2 % solution Use  as directed 15 mLs in the mouth or throat every 4 (four) hours as needed for mouth pain. (Patient not taking: Reported on 03/06/2022)  ? [DISCONTINUED] nystatin (MYCOSTATIN) 100000 UNIT/ML suspension Take 5 mLs (500,000 Units total) by mouth 4 (four) times daily. (Patient not taking: Reported on 03/06/2022)  ? ?No facility-administered encounter medications on file as of 03/06/2022.  ? ? ?Past Medical History:  ?Diagnosis Date  ? Acute meniscal tear of left knee   ? Diabetes mellitus without complication ([place])   ? History of hypothyroidism   ? Prediabetes 09/20/2017  ? Elevated blood glucose since 2013 possibly prior.  ? ? ?Past Surgical History:  ?Procedure Laterality Date  ? [place] ENDOMETRIAL ABLATION  03-16-2007  ? KNEE ARTHROSCOPY Right   ? KNEE ARTHROSCOPY WITH MEDIAL MENISECTOMY Left 05/17/2015  ? Procedure: LEFT KNEE ARTHROSCOPY WITH MEDIAL MENISECTOMY, CHONDROPLASTY PATELLA;  Surgeon: Latanya Maudlin, MD;  Location: [facility];  Service: Orthopedics;  Laterality: Left;  ? LAPAROSOCPY BILATERAL TUBAL LIGATION W/ CAUTERIZATION  10-15-2000  ? VAGINAL HYSTERECTOMY  01/25/2012  ? Procedure: HYSTERECTOMY VAGINAL;  Surgeon: Anastasio Auerbach, MD;  Location: [facility];  Service: Gynecology;  Laterality: N/A;  ? ? ?Family History  ?Problem Relation Age of Onset  ? Hypertension Mother   ? Cancer Mother   ?     LUG, THROAT, & BACK  ? Breast cancer Mother   ? Cancer Father   ?     THROAT  ? Throat cancer Father   ? Cancer Maternal Grandmother   ?  KIDNEY  ? Cancer Maternal Grandfather   ?     COLON  ? Colon cancer Maternal Grandfather 63  ? Diabetes Daughter   ? Stomach cancer Neg Hx   ? ? ?Social History  ? ?Socioeconomic History  ? Marital status: Married  ?  Spouse name: Not on file  ? Number of children: Not on file  ? Years of education: Not on file  ? Highest education level: Not on file  ?Occupational History  ? Not on file  ?Tobacco Use  ? Smoking status: Never  ? Smokeless  tobacco: Never  ?Vaping Use  ? Vaping Use: Never used  ?Substance and Sexual Activity  ? Alcohol use: No  ?  Alcohol/week: 0.0 standard drinks  ? Drug use: No  ? Sexual activity: Not Currently  ?  Comment: DES NEG,DECLINED INSURANCE QUESTIONS  ?Other Topics Concern  ? Not on file  ?Social History Narrative  ? Not on file  ? ?Social Determinants of Health  ? ?Financial Resource Strain: Not on file  ?Food Insecurity: Not on file  ?Transportation Needs: Not on file  ?Physical Activity: Not on file  ?Stress: Not on file  ?Social Connections: Not on file  ?Intimate Partner Violence: Not on file  ? ? ?Review of Systems  ?All other systems reviewed and are negative. ? ?  ? ? ?Objective   ? ?BP 127/88   Pulse 75   Temp 98.1 ?F (36.7 ?C) (Oral)   Resp 16   Ht 5' 9" (1.753 m)   Wt 194 lb (88 kg)   LMP 01/03/2012   SpO2 96%   BMI 28.65 kg/m?  ? ?Physical Exam ?Vitals and nursing note reviewed.  ?Constitutional:   ?   General: She is not in acute distress. ?HENT:  ?   Head: Normocephalic and atraumatic.  ?   Right Ear: Tympanic membrane, ear canal and external ear normal.  ?   Left Ear: Tympanic membrane, ear canal and external ear normal.  ?   Nose: Nose normal.  ?   Mouth/Throat:  ?   Mouth: Mucous membranes are moist.  ?   Pharynx: Oropharynx is clear.  ?Eyes:  ?   Conjunctiva/sclera: Conjunctivae normal.  ?   Pupils: Pupils are equal, round, and reactive to light.  ?Neck:  ?   Thyroid: No thyromegaly.  ?Cardiovascular:  ?   Rate and Rhythm: Normal rate and regular rhythm.  ?   Heart sounds: Normal heart sounds. No murmur heard. ?Pulmonary:  ?   Effort: Pulmonary effort is normal. No respiratory distress.  ?   Breath sounds: Normal breath sounds.  ?Abdominal:  ?   General: There is no distension.  ?   Palpations: Abdomen is soft. There is no mass.  ?   Tenderness: There is no abdominal tenderness.  ?Musculoskeletal:     ?   General: Normal range of motion.  ?   Cervical back: Normal range of motion and neck supple.   ?   Comments: Left knee has been surgically replaced  ?Skin: ?   General: Skin is warm and dry.  ?Neurological:  ?   General: No focal deficit present.  ?   Mental Status: She is alert and oriented to person, place, and time.  ?Psychiatric:     ?   Mood and Affect: Mood normal.     ?   Behavior: Behavior normal.  ? ? ? ?  ? ?Assessment & Plan:  ? ?1. Annual physical exam ?  Routine labs ordered ?- CMP14+EGFR ? ?2. Screening for endocrine/metabolic/immunity disorders ? ?- TSH ?- Vitamin D, 25-hydroxy ? ?3. Screening for lipid disorders ? ?- Lipid Panel ? ?4. Screening for deficiency anemia ? ?- CBC with Differential ? ?5. Type 2 diabetes mellitus without complication, with long-term current use of insulin ([place]) ?Patient has follow up with consultant later this month. She reports that she will inform him that she desires to [facility] management of her diabetes here unless there are significant changes in the future.Marland Kitchen  ?- Microalbumin / creatinine urine ratio ?- POCT glycosylated hemoglobin (Hb A1C) ? ?No follow-ups on file.  ? ?Becky Sax, MD ? ? ?

## 2022-03-07 LAB — LIPID PANEL
Chol/HDL Ratio: 5.3 ratio — ABNORMAL HIGH (ref 0.0–4.4)
Cholesterol, Total: 210 mg/dL — ABNORMAL HIGH (ref 100–199)
HDL: 40 mg/dL
LDL Chol Calc (NIH): 150 mg/dL — ABNORMAL HIGH (ref 0–99)
Triglycerides: 112 mg/dL (ref 0–149)
VLDL Cholesterol Cal: 20 mg/dL (ref 5–40)

## 2022-03-07 LAB — CBC WITH DIFFERENTIAL/PLATELET
Basophils Absolute: 0 x10E3/uL (ref 0.0–0.2)
Basos: 1 %
EOS (ABSOLUTE): 0.1 x10E3/uL (ref 0.0–0.4)
Eos: 2 %
Hematocrit: 37.8 % (ref 34.0–46.6)
Hemoglobin: 13.2 g/dL (ref 11.1–15.9)
Immature Grans (Abs): 0 x10E3/uL (ref 0.0–0.1)
Immature Granulocytes: 0 %
Lymphocytes Absolute: 2.1 x10E3/uL (ref 0.7–3.1)
Lymphs: 51 %
MCH: 30.3 pg (ref 26.6–33.0)
MCHC: 34.9 g/dL (ref 31.5–35.7)
MCV: 87 fL (ref 79–97)
Monocytes Absolute: 0.3 x10E3/uL (ref 0.1–0.9)
Monocytes: 6 %
Neutrophils Absolute: 1.7 x10E3/uL (ref 1.4–7.0)
Neutrophils: 40 %
Platelets: 282 x10E3/uL (ref 150–450)
RBC: 4.35 x10E6/uL (ref 3.77–5.28)
RDW: 13 % (ref 11.7–15.4)
WBC: 4.1 x10E3/uL (ref 3.4–10.8)

## 2022-03-07 LAB — CMP14+EGFR
ALT: 22 IU/L (ref 0–32)
AST: 23 IU/L (ref 0–40)
Albumin/Globulin Ratio: 1.6 (ref 1.2–2.2)
Albumin: 4.2 g/dL (ref 3.8–4.9)
Alkaline Phosphatase: 80 IU/L (ref 44–121)
BUN/Creatinine Ratio: 9 (ref 9–23)
BUN: 10 mg/dL (ref 6–24)
Bilirubin Total: 0.4 mg/dL (ref 0.0–1.2)
CO2: 23 mmol/L (ref 20–29)
Calcium: 9.3 mg/dL (ref 8.7–10.2)
Chloride: 106 mmol/L (ref 96–106)
Creatinine, Ser: 1.06 mg/dL — ABNORMAL HIGH (ref 0.57–1.00)
Globulin, Total: 2.7 g/dL (ref 1.5–4.5)
Glucose: 99 mg/dL (ref 70–99)
Potassium: 4.3 mmol/L (ref 3.5–5.2)
Sodium: 144 mmol/L (ref 134–144)
Total Protein: 6.9 g/dL (ref 6.0–8.5)
eGFR: 62 mL/min/1.73

## 2022-03-07 LAB — MICROALBUMIN / CREATININE URINE RATIO
Creatinine, Urine: 201.5 mg/dL
Microalb/Creat Ratio: 2 mg/g creat (ref 0–29)
Microalbumin, Urine: 4.8 ug/mL

## 2022-03-07 LAB — TSH: TSH: 3.57 u[IU]/mL (ref 0.450–4.500)

## 2022-03-07 LAB — VITAMIN D 25 HYDROXY (VIT D DEFICIENCY, FRACTURES): Vit D, 25-Hydroxy: 30.6 ng/mL (ref 30.0–100.0)

## 2022-03-13 ENCOUNTER — Ambulatory Visit: Payer: 59 | Admitting: Endocrinology

## 2022-04-26 ENCOUNTER — Encounter: Payer: Self-pay | Admitting: Family Medicine

## 2022-04-27 ENCOUNTER — Ambulatory Visit: Payer: Self-pay

## 2022-04-27 MED ORDER — TRULICITY 4.5 MG/0.5ML ~~LOC~~ SOAJ: 4.5000 mg | SUBCUTANEOUS | 1 refills | Status: DC

## 2022-04-30 ENCOUNTER — Ambulatory Visit (INDEPENDENT_AMBULATORY_CARE_PROVIDER_SITE_OTHER): Payer: 59

## 2022-04-30 ENCOUNTER — Ambulatory Visit
Admission: RE | Admit: 2022-04-30 | Discharge: 2022-04-30 | Disposition: A | Discharge status: Home / Self Care | Payer: 59 | Source: Ambulatory Visit | Attending: Family Medicine | Admitting: Family Medicine

## 2022-04-30 VITALS — BP 133/90 | HR 80 | Temp 98.2°F | Resp 18

## 2022-04-30 DIAGNOSIS — M545 Low back pain, unspecified: Secondary | ICD-10-CM

## 2022-04-30 LAB — POCT URINALYSIS DIP (MANUAL ENTRY)
Bilirubin, UA: NEGATIVE
Glucose, UA: NEGATIVE mg/dL
Ketones, POC UA: NEGATIVE mg/dL
Leukocytes, UA: NEGATIVE
Nitrite, UA: NEGATIVE
Protein Ur, POC: NEGATIVE mg/dL
Spec Grav, UA: 1.025 (ref 1.010–1.025)
Urobilinogen, UA: 0.2 E.U./dL
pH, UA: 5.5 (ref 5.0–8.0)

## 2022-04-30 MED ORDER — TIZANIDINE HCL 4 MG PO TABS: 4.0000 mg | ORAL_TABLET | Freq: Three times a day (TID) | ORAL | 0 refills | Status: DC | PRN

## 2022-04-30 MED ORDER — KETOROLAC TROMETHAMINE 30 MG/ML IJ SOLN
30.0000 mg | Freq: Once | INTRAMUSCULAR | Status: AC
  Administered 2022-04-30: 30 mg via INTRAMUSCULAR

## 2022-04-30 MED ORDER — IBUPROFEN 800 MG PO TABS
800.0000 mg | ORAL_TABLET | Freq: Three times a day (TID) | ORAL | 0 refills | Status: AC | PRN
Start: 2022-04-30 — End: ?

## 2022-04-30 NOTE — ED Triage Notes (Signed)
Pt present urinary frequency, symptom started a few week days ago. Pt states having sharp in her lower back area.

## 2022-04-30 NOTE — ED Provider Notes (Signed)
[facility]    CSN: [id number] Arrival date & time: 04/30/22  0957      History   Chief Complaint Chief Complaint  Patient presents with   Back Pain    May have urinary track infection - Entered by patient    HPI [name] is a 56 y.o. female.    Back Pain  Here for left low back pain that is been going on for about a week fairly constant and sharp.  It seems that it gets worse when she is moving, but it is difficult to tell from her history.  No fever or chills.  No urinary symptoms including no dysuria or hematuria  No vomiting.  No trauma or fall  She has had a hysterectomy  Past Medical History:  Diagnosis Date   Acute meniscal tear of left knee    Diabetes mellitus without complication ([place])    History of hypothyroidism    Prediabetes 2017-10-08   Elevated blood glucose since 12-31-2011 possibly prior.    Patient Active Problem List   Diagnosis Date Noted   Chronic midline low back pain without sciatica 02/13/2018   Mixed diabetic hyperlipidemia associated with type 2 diabetes mellitus ([place]) 10/25/2017   Hypertension associated with diabetes ([place]) 10/25/2017   Diabetes mellitus, type II ([place]) 10/25/2017   H/O total hysterectomy- benign reasons 10-08-2017   Left knee pain- Dr Amedeo Plenty 08-Oct-2017   Perimenopausal October 08, 2017   Family history of breast cancer in mother-deceased late 12/30/2022 10/08/2017   Family history of colon cancer- MGF- in 60's 10/08/17   Blood glucose elevated 2017-10-08   Elevated alanine aminotransferase (ALT) level 08-Oct-2017   Low HDL (under 40) 10-08-2017   Severe obesity (BMI 35.0-35.9 with comorbidity) ([place]) 08-Oct-2017   Vitamin D deficiency 10-08-2017   Family history of diabetes mellitus (DM)- in daughter 2017/10/08   Hypothyroidism 02/08/2013    Past Surgical History:  Procedure Laterality Date   HYSTEROSCOPY W/ THERMACHOICE ENDOMETRIAL ABLATION  03-16-2007   KNEE ARTHROSCOPY Right    KNEE ARTHROSCOPY WITH MEDIAL  MENISECTOMY Left 05/17/2015   Procedure: LEFT KNEE ARTHROSCOPY WITH MEDIAL MENISECTOMY, CHONDROPLASTY PATELLA;  Surgeon: Latanya Maudlin, MD;  Location: [facility];  Service: Orthopedics;  Laterality: Left;   LAPAROSOCPY BILATERAL TUBAL LIGATION W/ CAUTERIZATION  10-15-2000   VAGINAL HYSTERECTOMY  01/25/2012   Procedure: HYSTERECTOMY VAGINAL;  Surgeon: Anastasio Auerbach, MD;  Location: [facility];  Service: Gynecology;  Laterality: N/A;    OB History     Gravida  7   Para  4   Term      Preterm      AB  3   Living  4      SAB  3   IAB      Ectopic      Multiple      Live Births               Home Medications    Prior to Admission medications   Medication Sig Start Date End Date Taking? Authorizing Provider  ibuprofen (ADVIL) 800 MG tablet Take 1 tablet (800 mg total) by mouth every 8 (eight) hours as needed (pain). 04/30/22  Yes Bartlett Enke, Gwenlyn Perking, MD  tiZANidine (ZANAFLEX) 4 MG tablet Take 1 tablet (4 mg total) by mouth every 8 (eight) hours as needed for muscle spasms. 04/30/22  Yes Deanie Jupiter, Gwenlyn Perking, MD  Accu-Chek FastClix Lancets MISC USE FASTLIX LANCETS AS INSTRUCTED TO CHECK BLOOD SUGAR TWICE DAILY. DX:E11.59 05/13/21  Renato Shin, MD  ACCU-CHEK GUIDE test strip USE 1 EACH BY OTHER ROUTE 2 (TWO) TIMES DAILY. E11.9 02/03/22   Renato Shin, MD  BD ULTRA-FINE PEN NEEDLES 29G X 12.7MM MISC USE ONCE DAILY AS DIRECTED 11/25/20   Renato Shin, MD  Blood Glucose Monitoring Suppl (ACCU-CHEK GUIDE) w/Device KIT Please specify directions, refills and quantity Patient taking differently: 1 each by Other route daily. E11.9 12/23/18   Renato Shin, MD  Dulaglutide (TRULICITY) 4.5 JG/8.1LX SOPN Inject 4.5 mg as directed once a week. 04/27/22   Dorna Mai, MD    Family History Family History  Problem Relation Age of Onset   Hypertension Mother    Cancer Mother        LUG, THROAT, & BACK   Breast cancer Mother    Cancer Father        THROAT   Throat cancer  Father    Cancer Maternal Grandmother        KIDNEY   Cancer Maternal Grandfather        COLON   Colon cancer Maternal Grandfather 61   Diabetes Daughter    Stomach cancer Neg Hx     Social History Social History   Tobacco Use   Smoking status: Never   Smokeless tobacco: Never  Vaping Use   Vaping Use: Never used  Substance Use Topics   Alcohol use: No    Alcohol/week: 0.0 standard drinks of alcohol   Drug use: No     Allergies   Adhesive [tape]   Review of Systems Review of Systems  Musculoskeletal:  Positive for back pain.     Physical Exam Triage Vital Signs ED Triage Vitals  Enc Vitals Group     BP 04/30/22 1028 133/90     Pulse Rate 04/30/22 1028 80     Resp 04/30/22 1028 18     Temp 04/30/22 1028 98.2 F (36.8 C)     Temp Source 04/30/22 1028 Oral     SpO2 04/30/22 1028 99 %     Weight --      Height --      Head Circumference --      Peak Flow --      Pain Score 04/30/22 1027 9     Pain Loc --      Pain Edu? --      Excl. in [place]? --    No data found.  Updated Vital Signs BP 133/90 (BP Location: Left Arm)   Pulse 80   Temp 98.2 F (36.8 C) (Oral)   Resp 18   LMP 01/03/2012   SpO2 99%   Visual Acuity Right Eye Distance:   Left Eye Distance:   Bilateral Distance:    Right Eye Near:   Left Eye Near:    Bilateral Near:     Physical Exam Vitals reviewed.  Constitutional:      Appearance: She is not ill-appearing, toxic-appearing or diaphoretic.     Comments: In Some obvious discomfort  HENT:     Mouth/Throat:     Mouth: Mucous membranes are moist.  Cardiovascular:     Rate and Rhythm: Normal rate and regular rhythm.     Heart sounds: No murmur heard. Pulmonary:     Effort: Pulmonary effort is normal.     Breath sounds: Normal breath sounds. No stridor. No wheezing, rhonchi or rales.  Abdominal:     Palpations: Abdomen is soft.     Tenderness: There is no abdominal tenderness.  Musculoskeletal:  General: Tenderness  (Over left lower lumbar musculature to digital palpation) present.     Cervical back: Neck supple.  Lymphadenopathy:     Cervical: No cervical adenopathy.  Skin:    Coloration: Skin is not jaundiced or pale.  Neurological:     General: No focal deficit present.     Mental Status: She is alert and oriented to person, place, and time.      UC Treatments / Results  Labs (all labs ordered are listed, but only abnormal results are displayed) Labs Reviewed  POCT URINALYSIS DIP (MANUAL ENTRY) - Abnormal; Notable for the following components:      Result Value   Blood, UA trace-intact (*)    All other components within normal limits    EKG   Radiology DG Lumbar Spine 2-3 Views  Result Date: 04/30/2022 CLINICAL DATA:  low back pain on left EXAM: LUMBAR SPINE - 2-3 VIEW COMPARISON:  None Available. FINDINGS: There is no evidence of lumbar spine fracture. Alignment is normal. Intervertebral disc spaces are maintained. Mild lumbar spondylosis with mild-to-moderate facet hypertrophic changes in the lower lumbar spine and at L5-S1 region. Moderate stool burden at the cecum-ascending colon. IMPRESSION: Lumbar spondylosis with mild-to-moderate facet hypertrophic changes at the lower lumbar spine and L5-S1 regions. Electronically Signed   By: Frazier Richards M.D.   On: 04/30/2022 11:29    Procedures Procedures (including critical care time)  Medications Ordered in UC Medications  ketorolac (TORADOL) 30 MG/ML injection 30 mg (has no administration in time range)    Initial Impression / Assessment and Plan / UC Course  I have reviewed the triage vital signs and the nursing notes.  Pertinent labs & imaging results that were available during my care of the patient were reviewed by me and considered in my medical decision making (see chart for details).     UA is benign showing only a trace of blood. X-rays do show some degenerative changes in the lumbar spine.  We will treat for pain and have  her follow-up with her primary care Final Clinical Impressions(s) / UC Diagnoses   Final diagnoses:  Acute left-sided low back pain without sciatica     Discharge Instructions      You have been given a shot of Toradol 30 mg today.  Your x-ray showed some degenerative changes in your lumbar spine.  This is a possible cause of your pain  Please follow-up with your primary care, especially if not improving much       ED Prescriptions     Medication Sig Dispense Auth. Provider   ibuprofen (ADVIL) 800 MG tablet Take 1 tablet (800 mg total) by mouth every 8 (eight) hours as needed (pain). 21 tablet Jennavieve Arrick, Gwenlyn Perking, MD   tiZANidine (ZANAFLEX) 4 MG tablet Take 1 tablet (4 mg total) by mouth every 8 (eight) hours as needed for muscle spasms. 30 tablet Tenille Morrill, Gwenlyn Perking, MD      PDMP not reviewed this encounter.   Barrett Henle, MD 04/30/22 1145

## 2022-04-30 NOTE — Discharge Instructions (Addendum)
You have been given a shot of Toradol 30 mg today.  Your x-ray showed some degenerative changes in your lumbar spine.  This is a possible cause of your pain  Please follow-up with your primary care, especially if not improving much

## 2022-06-12 NOTE — Telephone Encounter (Signed)
Error

## 2022-07-09 ENCOUNTER — Ambulatory Visit (INDEPENDENT_AMBULATORY_CARE_PROVIDER_SITE_OTHER): Payer: 59 | Admitting: Nurse Practitioner

## 2022-07-09 ENCOUNTER — Encounter: Payer: Self-pay | Admitting: Nurse Practitioner

## 2022-07-09 VITALS — BP 118/74 | HR 96 | Ht 67.25 in | Wt 196.0 lb

## 2022-07-09 DIAGNOSIS — Z01419 Encounter for gynecological examination (general) (routine) without abnormal findings: Secondary | ICD-10-CM | POA: Diagnosis not present

## 2022-07-09 NOTE — Progress Notes (Signed)
   [name] November 04, 1965 [id number]   History:  56 y.o. [id number] presents for annual exam without GYN complaints. S/P 2013 TVH for dysmenorrhea and menorrhagia. Having some night sweats but says they are tolerable. Normal pap and mammogram history. T2DM managed by endocrinology. Down 20 pounds with diet and exercise.   Gynecologic History Patient's last menstrual period was 01/03/2012.   Contraception/Family planning: status post hysterectomy Sexually active: Yes  Health Maintenance Last Pap: 11/26/2011. Results were: Normal Last mammogram: 09/17/2021. Results were: Normal Last colonoscopy: 10/11/2017. Results were: Normal, 10-year recall Last Dexa: Never  Past medical history, past surgical history, family history and social history were all reviewed and documented in the EPIC chart. Recently separated. 4 children, all live local. 3 grandchildren ages 55 months, 21 and 40 years.   ROS:  A ROS was performed and pertinent positives and negatives are included.  Exam:  Vitals:   07/09/22 1427  BP: 118/74  Pulse: 96  SpO2: 97%  Weight: 196 lb (88.9 kg)  Height: 5' 7.25" (1.708 m)    Body mass index is 30.47 kg/m.  General appearance:  Normal Thyroid:  Symmetrical, normal in size, without palpable masses or nodularity. Respiratory  Auscultation:  Clear without wheezing or rhonchi Cardiovascular  Auscultation:  Regular rate, without rubs, murmurs or gallops  Edema/varicosities:  Not grossly evident Abdominal  Soft,nontender, without masses, guarding or rebound.  Liver/spleen:  No organomegaly noted  Hernia:  None appreciated  Skin  Inspection:  Grossly normal   Breasts: Examined lying and sitting.   Right: Without masses, retractions, discharge or axillary adenopathy.   Left: Without masses, retractions, discharge or axillary adenopathy. Genitourinary   Inguinal/mons:  Normal without inguinal adenopathy  External genitalia:  Normal appearing vulva with no masses,  tenderness, or lesions  BUS/Urethra/Skene's glands:  Normal  Vagina:  Normal appearing with normal color and discharge, no lesions  Cervix:  and uterus absent  Adnexa/parametria:     Rt: Normal in size, without masses or tenderness.   Lt: Normal in size, without masses or tenderness.  Anus and perineum: Normal  Digital rectal exam: Normal sphincter tone without palpated masses or tenderness  Patient informed chaperone available to be present for breast and pelvic exam. Patient has requested no chaperone to be present. Patient has been advised what will be completed during breast and pelvic exam.   Assessment/Plan:  56 y.o. [id number] for annual exam.   Well female exam with routine gynecological exam - Education provided on SBEs, importance of preventative screenings, current guidelines, high calcium diet, regular exercise, and multivitamin daily. Labs with PCP and endocrinology. Congratulated on weight loss.   Screening for cervical cancer - Normal Pap history.  No longer screening per guidelines.   Screening for breast cancer - Normal mammogram history.  Continue annual screenings.  Normal breast exam today.  Screening for colon cancer - Normal colonoscopy in 2018. Will repeat at GI's recommended interval.    Screening for osteoporosis - Average risk. Will plan DXA at age 60.   Follow up in 1 year for annual.    Tamela Gammon [facility], 3:00 PM 07/09/2022

## 2022-10-21 ENCOUNTER — Other Ambulatory Visit: Payer: Self-pay | Admitting: Family Medicine

## 2022-11-18 ENCOUNTER — Ambulatory Visit: Payer: 59 | Admitting: Family Medicine

## 2022-11-18 VITALS — BP 142/102 | HR 86 | Temp 98.0°F | Resp 16 | Wt 196.0 lb

## 2022-11-18 DIAGNOSIS — E039 Hypothyroidism, unspecified: Secondary | ICD-10-CM | POA: Diagnosis not present

## 2022-11-18 DIAGNOSIS — Z794 Long term (current) use of insulin: Secondary | ICD-10-CM

## 2022-11-18 DIAGNOSIS — I152 Hypertension secondary to endocrine disorders: Secondary | ICD-10-CM

## 2022-11-18 DIAGNOSIS — E1159 Type 2 diabetes mellitus with other circulatory complications: Secondary | ICD-10-CM

## 2022-11-18 DIAGNOSIS — E119 Type 2 diabetes mellitus without complications: Secondary | ICD-10-CM

## 2022-11-18 LAB — POCT GLYCOSYLATED HEMOGLOBIN (HGB A1C): Hemoglobin A1C: 6 % — AB (ref 4.0–5.6)

## 2022-11-18 MED ORDER — TRULICITY 4.5 MG/0.5ML ~~LOC~~ SOAJ: 4.5000 mg | SUBCUTANEOUS | 1 refills | Status: DC

## 2022-11-18 NOTE — Progress Notes (Unsigned)
Patient is here for their 6 month follow-up Patient has no concerns today Care gaps have been discussed with patient  

## 2022-11-19 ENCOUNTER — Encounter: Payer: Self-pay | Admitting: Family Medicine

## 2022-11-19 MED ORDER — LISINOPRIL 10 MG PO TABS: 10.0000 mg | ORAL_TABLET | Freq: Every day | ORAL | 0 refills | Status: DC

## 2022-11-19 NOTE — Progress Notes (Signed)
Established Patient Office Visit  Subjective    Patient ID: [name], female    DOB: 1966-02-12  Age: 57 y.o. MRN: [id number]  CC: No chief complaint on file.   HPI [name] presents for follow up of chronic med issues.  Patient denies acute complaints or concerns.    Outpatient Encounter Medications as of 11/18/2022  Medication Sig   Accu-Chek FastClix Lancets MISC USE FASTLIX LANCETS AS INSTRUCTED TO CHECK BLOOD SUGAR TWICE DAILY. DX:E11.59   ACCU-CHEK GUIDE test strip USE 1 EACH BY OTHER ROUTE 2 (TWO) TIMES DAILY. E11.9   BD ULTRA-FINE PEN NEEDLES 29G X 12.7MM MISC USE ONCE DAILY AS DIRECTED   Blood Glucose Monitoring Suppl (ACCU-CHEK GUIDE) w/Device KIT Please specify directions, refills and quantity (Patient taking differently: 1 each by Other route daily. E11.9)   Dulaglutide (TRULICITY) 4.5 VQ/2.5ZD SOPN Inject 4.5 mg as directed once a week.   ibuprofen (ADVIL) 800 MG tablet Take 1 tablet (800 mg total) by mouth every 8 (eight) hours as needed (pain).   [DISCONTINUED] Dulaglutide (TRULICITY) 4.5 GL/8.7FI SOPN Inject 4.5 mg as directed once a week.   No facility-administered encounter medications on file as of 11/18/2022.    Past Medical History:  Diagnosis Date   Acute meniscal tear of left knee    Diabetes mellitus without complication ([place])    History of hypothyroidism    Prediabetes 09/20/2017   Elevated blood glucose since 2013 possibly prior.    Past Surgical History:  Procedure Laterality Date   HYSTEROSCOPY W/ THERMACHOICE ENDOMETRIAL ABLATION  03-16-2007   KNEE ARTHROSCOPY Right    KNEE ARTHROSCOPY WITH MEDIAL MENISECTOMY Left 05/17/2015   Procedure: LEFT KNEE ARTHROSCOPY WITH MEDIAL MENISECTOMY, CHONDROPLASTY PATELLA;  Surgeon: Latanya Maudlin, MD;  Location: [facility];  Service: Orthopedics;  Laterality: Left;   LAPAROSOCPY BILATERAL TUBAL LIGATION W/ CAUTERIZATION  10-15-2000   VAGINAL HYSTERECTOMY  01/25/2012   Procedure:  HYSTERECTOMY VAGINAL;  Surgeon: Anastasio Auerbach, MD;  Location: [facility];  Service: Gynecology;  Laterality: N/A;    Family History  Problem Relation Age of Onset   Hypertension Mother    Cancer Mother        LUG, THROAT, & BACK   Breast cancer Mother    Cancer Father        THROAT   Throat cancer Father    Cancer Maternal Grandmother        KIDNEY   Cancer Maternal Grandfather        COLON   Colon cancer Maternal Grandfather 30   Diabetes Daughter    Stomach cancer Neg Hx     Social History   Socioeconomic History   Marital status: Married    Spouse name: Not on file   Number of children: Not on file   Years of education: Not on file   Highest education level: Not on file  Occupational History   Not on file  Tobacco Use   Smoking status: Never   Smokeless tobacco: Never  Vaping Use   Vaping Use: Never used  Substance and Sexual Activity   Alcohol use: No    Alcohol/week: 0.0 standard drinks of alcohol   Drug use: No   Sexual activity: Not Currently    Birth control/protection: Surgical    Comment: DES NEG,DECLINED INSURANCE QUESTIONS, Hyst  Other Topics Concern   Not on file  Social History Narrative   Not on file   Social Determinants of Health   Financial  Resource Strain: Not on file  Food Insecurity: Not on file  Transportation Needs: Not on file  Physical Activity: Not on file  Stress: Not on file  Social Connections: Not on file  Intimate Partner Violence: Not on file    Review of Systems  All other systems reviewed and are negative.       Objective    BP (!) 142/102   Pulse 86   Temp 98 F (36.7 C) (Oral)   Resp 16   Wt 196 lb (88.9 kg)   LMP 01/03/2012   SpO2 95%   BMI 30.47 kg/m   Physical Exam Vitals and nursing note reviewed.  Constitutional:      General: She is not in acute distress. Cardiovascular:     Rate and Rhythm: Normal rate and regular rhythm.  Pulmonary:     Effort: Pulmonary effort is normal.     Breath  sounds: Normal breath sounds.  Abdominal:     Palpations: Abdomen is soft.     Tenderness: There is no abdominal tenderness.  Neurological:     General: No focal deficit present.     Mental Status: She is alert and oriented to person, place, and time.         Assessment & Plan:   1. Type 2 diabetes mellitus without complication, with long-term current use of insulin (HCC) A1c is stable and at goal. Continue and monitor - POCT glycosylated hemoglobin (Hb A1C)  2. Hypertension associated with diabetes ([place]) Elevated reading. Will prescribed lisinopril 10 mg prescribed  3. Hypothyroidism, unspecified type Appears stable.     Return in about 6 months (around 05/19/2023) for follow up.   Becky Sax, MD

## 2022-11-20 ENCOUNTER — Encounter: Payer: Self-pay | Admitting: Family Medicine

## 2022-11-20 NOTE — Telephone Encounter (Signed)
Please advise this patient

## 2023-01-01 ENCOUNTER — Ambulatory Visit
Admission: RE | Admit: 2023-01-01 | Discharge: 2023-01-01 | Disposition: A | Discharge status: Home / Self Care | Payer: 59 | Source: Ambulatory Visit | Attending: Physician Assistant | Admitting: Physician Assistant

## 2023-01-01 ENCOUNTER — Ambulatory Visit (INDEPENDENT_AMBULATORY_CARE_PROVIDER_SITE_OTHER): Payer: 59

## 2023-01-01 VITALS — BP 131/95 | HR 129 | Temp 99.4°F | Resp 18

## 2023-01-01 DIAGNOSIS — R509 Fever, unspecified: Secondary | ICD-10-CM | POA: Diagnosis not present

## 2023-01-01 DIAGNOSIS — J029 Acute pharyngitis, unspecified: Secondary | ICD-10-CM | POA: Diagnosis not present

## 2023-01-01 DIAGNOSIS — R059 Cough, unspecified: Secondary | ICD-10-CM | POA: Diagnosis not present

## 2023-01-01 DIAGNOSIS — J069 Acute upper respiratory infection, unspecified: Secondary | ICD-10-CM | POA: Diagnosis not present

## 2023-01-01 DIAGNOSIS — R062 Wheezing: Secondary | ICD-10-CM

## 2023-01-01 DIAGNOSIS — J111 Influenza due to unidentified influenza virus with other respiratory manifestations: Secondary | ICD-10-CM

## 2023-01-01 LAB — POCT INFLUENZA A/B
Influenza A, POC: NEGATIVE
Influenza B, POC: NEGATIVE

## 2023-01-01 LAB — POCT RAPID STREP A (OFFICE): Rapid Strep A Screen: NEGATIVE

## 2023-01-01 MED ORDER — IBUPROFEN 600 MG PO TABS
600.0000 mg | ORAL_TABLET | Freq: Once | ORAL | Status: AC
  Administered 2023-01-01: 600 mg via ORAL

## 2023-01-01 MED ORDER — ALBUTEROL SULFATE (2.5 MG/3ML) 0.083% IN NEBU
2.5000 mg | INHALATION_SOLUTION | Freq: Once | RESPIRATORY_TRACT | Status: AC
  Administered 2023-01-01: 2.5 mg via RESPIRATORY_TRACT

## 2023-01-01 MED ORDER — ALBUTEROL SULFATE HFA 108 (90 BASE) MCG/ACT IN AERS: 2.0000 | INHALATION_SPRAY | Freq: Four times a day (QID) | RESPIRATORY_TRACT | 0 refills | Status: AC | PRN

## 2023-01-01 MED ORDER — OSELTAMIVIR PHOSPHATE 75 MG PO CAPS: 75.0000 mg | ORAL_CAPSULE | Freq: Two times a day (BID) | ORAL | 0 refills | Status: AC

## 2023-01-01 MED ORDER — SPACER/AERO-HOLDING CHAMBERS DEVI: 1.0000 [IU] | Freq: Three times a day (TID) | 0 refills | Status: AC

## 2023-01-01 MED ORDER — DM-GUAIFENESIN ER 30-600 MG PO TB12: 1.0000 | ORAL_TABLET | Freq: Two times a day (BID) | ORAL | 0 refills | Status: AC

## 2023-01-01 NOTE — Discharge Instructions (Signed)
Advised to take Tylenol or ibuprofen on a regular basis to help control fever and body aches. Advised to use the albuterol inhaler with spacer, 2 puffs every 6 hours on a regular basis to help decrease wheezing, cough, and congestion. Advised to take Mucinex DM every 12 hours to help control cough and congestion.    Advised to follow-up PCP or return to urgent care if symptoms fail to improve.

## 2023-01-01 NOTE — ED Triage Notes (Addendum)
Pt is herr for sore throat, nasal congestion, runny nose,  fatigue ,back pain , nasal congestion  x1wk. Pt tried otc mrds but, itdid no work

## 2023-01-01 NOTE — ED Provider Notes (Signed)
[facility]    CSN: [id number] Arrival date & time: 01/01/23  1547      History   Chief Complaint Chief Complaint  Patient presents with   Cough    Very bad cold and sore throat - Entered by patient   Sore Throat    HPI [name] is a 57 y.o. female.   57 year old female presents with fever, cough, wheezing.  Patient indicates for the past week she has been having persistent and progressive cough, chest congestion, wheezing, and shortness of breath.  Patient indicates that the production is thick, clear to yellow.  Patient also indicates she is having upper respiratory congestion with sinus congestion, postnasal drip and rhinitis production being yellow.  Patient indicates she is having sore throat with progressive swallowing.  She indicates she started running fever last night, 100-101.  She has having body aches, muscle soreness, fatigue, chills, and sweats.  She indicates she has having shortness of breath and wheezing with activity.  She is without nausea or vomiting.  She did take some Tylenol this morning with minimal relief of her symptoms.  She indicates being extremely fatigued and tired.  She is tolerating fluids well.   Cough Associated symptoms: chills, fever, sore throat and wheezing   Sore Throat    Past Medical History:  Diagnosis Date   Acute meniscal tear of left knee    Diabetes mellitus without complication ([place])    History of hypothyroidism    Prediabetes Oct 12, 2017   Elevated blood glucose since 02-02-12 possibly prior.    Patient Active Problem List   Diagnosis Date Noted   Chronic midline low back pain without sciatica 02/13/2018   Mixed diabetic hyperlipidemia associated with type 2 diabetes mellitus ([place]) 10/25/2017   Hypertension associated with diabetes ([place]) 10/25/2017   Diabetes mellitus, type II ([place]) 10/25/2017   H/O total hysterectomy- benign reasons Oct 12, 2017   Left knee pain- Dr Amedeo Plenty 10/12/17   Perimenopausal  10/12/17   Family history of breast cancer in mother-deceased late 02-02-2023 10-12-17   Family history of colon cancer- MGF- in 60's 10/12/2017   Blood glucose elevated 2017-10-12   Elevated alanine aminotransferase (ALT) level 10-12-2017   Low HDL (under 40) 12-Oct-2017   Severe obesity (BMI 35.0-35.9 with comorbidity) ([place]) 2017-10-12   Vitamin D deficiency October 12, 2017   Family history of diabetes mellitus (DM)- in daughter 2017/10/12   Hypothyroidism 02/08/2013    Past Surgical History:  Procedure Laterality Date   HYSTEROSCOPY W/ THERMACHOICE ENDOMETRIAL ABLATION  03-16-2007   KNEE ARTHROSCOPY Right    KNEE ARTHROSCOPY WITH MEDIAL MENISECTOMY Left 05/17/2015   Procedure: LEFT KNEE ARTHROSCOPY WITH MEDIAL MENISECTOMY, CHONDROPLASTY PATELLA;  Surgeon: Latanya Maudlin, MD;  Location: [facility];  Service: Orthopedics;  Laterality: Left;   LAPAROSOCPY BILATERAL TUBAL LIGATION W/ CAUTERIZATION  10-15-2000   VAGINAL HYSTERECTOMY  01/25/2012   Procedure: HYSTERECTOMY VAGINAL;  Surgeon: Anastasio Auerbach, MD;  Location: [facility];  Service: Gynecology;  Laterality: N/A;    OB History     Gravida  7   Para  4   Term      Preterm      AB  3   Living  4      SAB  3   IAB      Ectopic      Multiple      Live Births               Home Medications  Prior to Admission medications   Medication Sig Start Date End Date Taking? Authorizing Provider  Accu-Chek FastClix Lancets MISC USE FASTLIX LANCETS AS INSTRUCTED TO CHECK BLOOD SUGAR TWICE DAILY. DX:E11.59 05/13/21  Yes Renato Shin, MD  ACCU-CHEK GUIDE test strip USE 1 EACH BY OTHER ROUTE 2 (TWO) TIMES DAILY. E11.9 02/03/22  Yes Renato Shin, MD  albuterol (VENTOLIN HFA) 108 (90 Base) MCG/ACT inhaler Inhale 2 puffs into the lungs every 6 (six) hours as needed for wheezing or shortness of breath. 01/01/23  Yes Nyoka Lint, PA-C  BD ULTRA-FINE PEN NEEDLES 29G X 12.7MM MISC USE ONCE DAILY AS DIRECTED 11/25/20  Yes  Renato Shin, MD  Blood Glucose Monitoring Suppl (ACCU-CHEK GUIDE) w/Device KIT Please specify directions, refills and quantity Patient taking differently: 1 each by Other route daily. E11.9 12/23/18  Yes Renato Shin, MD  dextromethorphan-guaiFENesin [facility] DM) 30-600 MG 12hr tablet Take 1 tablet by mouth 2 (two) times daily. 01/01/23  Yes Nyoka Lint, PA-C  Dulaglutide (TRULICITY) 4.5 0000000 SOPN Inject 4.5 mg as directed once a week. 11/18/22  Yes Dorna Mai, MD  ibuprofen (ADVIL) 800 MG tablet Take 1 tablet (800 mg total) by mouth every 8 (eight) hours as needed (pain). 04/30/22  Yes Barrett Henle, MD  oseltamivir (TAMIFLU) 75 MG capsule Take 1 capsule (75 mg total) by mouth every 12 (twelve) hours. 01/01/23  Yes Nyoka Lint, PA-C  Spacer/Aero-Holding Chambers DEVI 1 Units by Does not apply route in the morning, at noon, and at bedtime. 01/01/23  Yes Nyoka Lint, PA-C  lisinopril (ZESTRIL) 10 MG tablet Take 1 tablet (10 mg total) by mouth daily. 11/19/22   Dorna Mai, MD    Family History Family History  Problem Relation Age of Onset   Hypertension Mother    Cancer Mother        LUG, THROAT, & BACK   Breast cancer Mother    Cancer Father        THROAT   Throat cancer Father    Cancer Maternal Grandmother        KIDNEY   Cancer Maternal Grandfather        COLON   Colon cancer Maternal Grandfather 54   Diabetes Daughter    Stomach cancer Neg Hx     Social History Social History   Tobacco Use   Smoking status: Never   Smokeless tobacco: Never  Vaping Use   Vaping Use: Never used  Substance Use Topics   Alcohol use: No    Alcohol/week: 0.0 standard drinks of alcohol   Drug use: No     Allergies   Adhesive [tape]   Review of Systems Review of Systems  Constitutional:  Positive for chills, fatigue and fever.  HENT:  Positive for sore throat.   Respiratory:  Positive for cough and wheezing.      Physical Exam Triage Vital Signs ED Triage Vitals  Enc  Vitals Group     BP 01/01/23 1603 (!) 131/95     Pulse --      Resp 01/01/23 1603 18     Temp 01/01/23 1603 (!) 102.8 F (39.3 C)     Temp Source 01/01/23 1603 Oral     SpO2 01/01/23 1603 93 %     Weight --      Height --      Head Circumference --      Peak Flow --      Pain Score 01/01/23 1607 8     Pain Loc --  Pain Edu? --      Excl. in [place]? --    No data found.  Updated Vital Signs BP (!) 131/95 (BP Location: Left Arm)   Pulse (!) 129   Temp 99.4 F (37.4 C) (Oral)   Resp 18   LMP 01/03/2012   SpO2 90%   Visual Acuity Right Eye Distance:   Left Eye Distance:   Bilateral Distance:    Right Eye Near:   Left Eye Near:    Bilateral Near:     Physical Exam Constitutional:      Appearance: She is well-developed.  HENT:     Right Ear: Tympanic membrane and ear canal normal.     Left Ear: Tympanic membrane and ear canal normal.     Mouth/Throat:     Mouth: Mucous membranes are moist.     Pharynx: Posterior oropharyngeal erythema present. No oropharyngeal exudate.  Cardiovascular:     Rate and Rhythm: Normal rate and regular rhythm.     Heart sounds: Normal heart sounds.  Pulmonary:     Effort: Pulmonary effort is normal.     Breath sounds: Normal breath sounds and air entry. No wheezing, rhonchi or rales.  Lymphadenopathy:     Cervical: No cervical adenopathy.  Neurological:     Mental Status: She is alert.      UC Treatments / Results  Labs (all labs ordered are listed, but only abnormal results are displayed) Labs Reviewed  POCT INFLUENZA A/B  POCT RAPID STREP A (OFFICE)    EKG   Radiology DG Chest 2 View  Result Date: 01/01/2023 CLINICAL DATA:  Cough, fever, chills EXAM: CHEST - 2 VIEW COMPARISON:  None Available. FINDINGS: The heart size and mediastinal contours are within normal limits. Both lungs are clear. The visualized skeletal structures are unremarkable. IMPRESSION: No active cardiopulmonary disease. Electronically Signed   By:  Elmer Picker M.D.   On: 01/01/2023 17:02    Procedures Procedures (including critical care time)  Pulse ox is 95% on patient released.  Medications Ordered in UC Medications  ibuprofen (ADVIL) tablet 600 mg (600 mg Oral Given 01/01/23 1619)  albuterol (PROVENTIL) (2.5 MG/3ML) 0.083% nebulizer solution 2.5 mg (2.5 mg Nebulization Given 01/01/23 1619)    Initial Impression / Assessment and Plan / UC Course  I have reviewed the triage vital signs and the nursing notes.  Pertinent labs & imaging results that were available during my care of the patient were reviewed by me and considered in my medical decision making (see chart for details).    Plan: The diagnosis will be treated with the following: 1.  Fever: A.  Ibuprofen 600 mg given today in the office to help control fever. B.  Advised take Tylenol or ibuprofen on a regular basis to control fever and body aches. 2.  Sore throat: A.  Advised to take Tylenol and ibuprofen to help relieve throat pain. 3.  Wheezing: A.  Albuterol nebulizer 0.083% treatment given in the office today. B.  Advised to use albuterol inhaler with spacer, 2 puffs every 6 hours on a regular basis to decrease wheezing, chest congestion and shortness of breath. 3.  Upper respiratory infection: A.  Advised to use Mucinex DM every 12 hours to help control cough and congestion. 4. A.  Tamiflu 75 mg twice daily to treat the flu. 5.  Advised follow-up PCP return to urgent care if symptoms fail to improve. Final Clinical Impressions(s) / UC Diagnoses   Final diagnoses:  Fever, unspecified  Sore throat  Acute upper respiratory infection  Wheezing  Flu     Discharge Instructions      Advised to take Tylenol or ibuprofen on a regular basis to help control fever and body aches. Advised to use the albuterol inhaler with spacer, 2 puffs every 6 hours on a regular basis to help decrease wheezing, cough, and congestion. Advised to take Mucinex DM every 12  hours to help control cough and congestion.    Advised to follow-up PCP or return to urgent care if symptoms fail to improve.     ED Prescriptions     Medication Sig Dispense Auth. Provider   albuterol (VENTOLIN HFA) 108 (90 Base) MCG/ACT inhaler Inhale 2 puffs into the lungs every 6 (six) hours as needed for wheezing or shortness of breath. 8 g Nyoka Lint, PA-C   Spacer/Aero-Holding Chambers DEVI 1 Units by Does not apply route in the morning, at noon, and at bedtime. 1 Units Nyoka Lint, PA-C   dextromethorphan-guaiFENesin [facility] DM) 30-600 MG 12hr tablet Take 1 tablet by mouth 2 (two) times daily. 20 tablet Nyoka Lint, PA-C   oseltamivir (TAMIFLU) 75 MG capsule Take 1 capsule (75 mg total) by mouth every 12 (twelve) hours. 10 capsule Nyoka Lint, PA-C      PDMP not reviewed this encounter.   Nyoka Lint, PA-C 01/01/23 1712

## 2023-01-28 ENCOUNTER — Other Ambulatory Visit: Payer: Self-pay | Admitting: Family Medicine

## 2023-01-28 DIAGNOSIS — E119 Type 2 diabetes mellitus without complications: Secondary | ICD-10-CM

## 2023-01-28 NOTE — Telephone Encounter (Signed)
Requested medication (s) are due for refill today:   Yes  Requested medication (s) are on the active medication list:   Yes  Future visit scheduled:   Yes   Last ordered: Accu-Chek Lancets 05/13/2021 #102, 3 refills;   Accu-Chek test strips 02/03/2022 #200, 3 refills  Returned because these lancets and test strips were ordered by Dr. Renato Shin with [facility].    Is Dr. Redmond Pulling going to start prescribing these?    Requested Prescriptions  Pending Prescriptions Disp Refills   Accu-Chek FastClix Lancets MISC 102 each 3     Endocrinology: Diabetes - Testing Supplies Passed - 01/28/2023 11:50 AM      Passed - Valid encounter within last 12 months    Recent Outpatient Visits           2 months ago Type 2 diabetes mellitus without complication, with long-term current use of insulin ([place])   [facility], MD   10 months ago Annual physical exam   [facility], MD   1 year ago [facility] [place], NP   1 year ago Preoperative clearance   [facility], [place], NP   1 year ago Annual physical exam   [facility], [place], NP       Future Appointments             In 3 months Dorna Mai, MD [facility]             glucose blood (ACCU-CHEK GUIDE) test strip 200 strip 3    Sig: Use as instructed     Endocrinology: Diabetes - Testing Supplies Passed - 01/28/2023 11:50 AM      Passed - Valid encounter within last 12 months    Recent Outpatient Visits           2 months ago Type 2 diabetes mellitus without complication, with long-term current use of insulin ([place])   [facility] [facility], MD   10 months ago Annual physical exam   [place] [facility], MD    1 year ago [place] [facility] [place], NP   1 year ago Preoperative clearance   [place] [facility], [place], NP   1 year ago Annual physical exam   [facility], [place]nie Hail[place], NP       Future Appointments             In 3 months Dorna Mai, MD [facility]

## 2023-01-28 NOTE — Telephone Encounter (Signed)
Medication Refill - Medication: Accu-Chek FastClix Lancets MISC [id number] Accu-Chek FastClix Lancets MISC [id number]  ACCU-CHEK GUIDE test strip [id number]  Has the patient contacted their pharmacy? Yes.   (Agent: If no, request that the patient contact the pharmacy for the refill. If patient does not wish to contact the pharmacy document the reason why and proceed with request.) (Agent: If yes, when and what did the pharmacy advise?)  Preferred Pharmacy (with phone number or street name):  Has the patient been seen for an appointment in the last year OR does the patient have an upcoming appointment? Yes.    Agent: Please be advised that RX refills may take up to 3 business days. We ask that you follow-up with your pharmacy.

## 2023-01-29 MED ORDER — ACCU-CHEK FASTCLIX LANCETS MISC: 3 refills | Status: DC

## 2023-01-29 MED ORDER — ACCU-CHEK GUIDE VI STRP: ORAL_STRIP | 3 refills | Status: DC

## 2023-04-06 LAB — HM DIABETES EYE EXAM

## 2023-04-07 ENCOUNTER — Other Ambulatory Visit: Payer: Self-pay | Admitting: Family Medicine

## 2023-04-07 NOTE — Telephone Encounter (Signed)
Call patient with update. Please confirm if patient has a different preferred pharmacy to send Trulicity to as current pharmacy is on back order.

## 2023-04-07 NOTE — Telephone Encounter (Signed)
Thank you :)

## 2023-04-07 NOTE — Telephone Encounter (Signed)
Patient called and asked for rx to be sent to  [facility] [address] [place], [place] - [address] Phone: [phone]  Fax: [phone]

## 2023-05-19 ENCOUNTER — Ambulatory Visit (INDEPENDENT_AMBULATORY_CARE_PROVIDER_SITE_OTHER): Payer: 59 | Admitting: Family Medicine

## 2023-05-19 VITALS — BP 128/90 | HR 75 | Temp 98.1°F | Resp 16 | Wt 199.8 lb

## 2023-05-19 DIAGNOSIS — E039 Hypothyroidism, unspecified: Secondary | ICD-10-CM

## 2023-05-19 DIAGNOSIS — Z794 Long term (current) use of insulin: Secondary | ICD-10-CM

## 2023-05-19 DIAGNOSIS — E1159 Type 2 diabetes mellitus with other circulatory complications: Secondary | ICD-10-CM

## 2023-05-19 DIAGNOSIS — I152 Hypertension secondary to endocrine disorders: Secondary | ICD-10-CM

## 2023-05-19 LAB — POCT GLYCOSYLATED HEMOGLOBIN (HGB A1C): Hemoglobin A1C: 7 % — AB (ref 4.0–5.6)

## 2023-05-19 MED ORDER — TRULICITY 4.5 MG/0.5ML ~~LOC~~ SOAJ: 4.5000 mg | SUBCUTANEOUS | 0 refills | Status: DC

## 2023-05-19 NOTE — Progress Notes (Signed)
Patient is here for their 6 month follow-up Patient has no concerns today Care gaps have been discussed with patient  

## 2023-05-20 LAB — MICROALBUMIN / CREATININE URINE RATIO
Microalb/Creat Ratio: <4 mg/g creat (ref 0–29)
Microalbumin, Urine: <3 ug/mL

## 2023-05-21 LAB — SPECIMEN STATUS REPORT

## 2023-05-21 LAB — RENAL FUNCTION PANEL
BUN/Creatinine Ratio: 18 (ref 9–23)
Creatinine, Ser: 0.78 mg/dL (ref 0.57–1.00)

## 2023-05-24 ENCOUNTER — Encounter: Payer: Self-pay | Admitting: Family Medicine

## 2023-05-24 NOTE — Progress Notes (Signed)
Established Patient Office Visit  Subjective    Patient ID: [name], female    DOB: 12/19/1965  Age: 57 y.o. MRN: [id number]  CC: No chief complaint on file.   HPI [name] presents for routine follow up of chronic med issues. Patient denies acute complaints.   Outpatient Encounter Medications as of 05/19/2023  Medication Sig   Accu-Chek FastClix Lancets MISC Use to check blood sugar twice a day   albuterol (VENTOLIN HFA) 108 (90 Base) MCG/ACT inhaler Inhale 2 puffs into the lungs every 6 (six) hours as needed for wheezing or shortness of breath.   BD ULTRA-FINE PEN NEEDLES 29G X 12.7MM MISC USE ONCE DAILY AS DIRECTED   Blood Glucose Monitoring Suppl (ACCU-CHEK GUIDE) w/Device KIT Please specify directions, refills and quantity (Patient taking differently: 1 each by Other route daily. E11.9)   dextromethorphan-guaiFENesin (MUCINEX DM) 30-600 MG 12hr tablet Take 1 tablet by mouth 2 (two) times daily.   glucose blood (ACCU-CHEK GUIDE) test strip USE 1 EACH BY OTHER ROUTE 2 (TWO) TIMES DAILY. E11.9   ibuprofen (ADVIL) 800 MG tablet Take 1 tablet (800 mg total) by mouth every 8 (eight) hours as needed (pain).   lisinopril (ZESTRIL) 10 MG tablet Take 1 tablet (10 mg total) by mouth daily.   meloxicam (MOBIC) 15 MG tablet Take 15 mg by mouth 3 (three) times daily.   oseltamivir (TAMIFLU) 75 MG capsule Take 1 capsule (75 mg total) by mouth every 12 (twelve) hours.   Spacer/Aero-Holding Chambers DEVI 1 Units by Does not apply route in the morning, at noon, and at bedtime.   [DISCONTINUED] Dulaglutide (TRULICITY) 4.5 MG/0.5ML SOPN INJECT 4.5 MG AS DIRECTED ONCE A WEEK.   Dulaglutide (TRULICITY) 4.5 MG/0.5ML SOPN Inject 4.5 mg as directed once a week.   No facility-administered encounter medications on file as of 05/19/2023.    Past Medical History:  Diagnosis Date   Acute meniscal tear of left knee    Diabetes mellitus without complication (HCC)    History of  hypothyroidism    Prediabetes 09/20/2017   Elevated blood glucose since 2013 possibly prior.    Past Surgical History:  Procedure Laterality Date   HYSTEROSCOPY W/ THERMACHOICE ENDOMETRIAL ABLATION  03-16-2007   KNEE ARTHROSCOPY Right    KNEE ARTHROSCOPY WITH MEDIAL MENISECTOMY Left 05/17/2015   Procedure: LEFT KNEE ARTHROSCOPY WITH MEDIAL MENISECTOMY, CHONDROPLASTY PATELLA;  Surgeon: Ranee Gosselin, MD;  Location: [facility] [place];  Service: Orthopedics;  Laterality: Left;   LAPAROSOCPY BILATERAL TUBAL LIGATION W/ CAUTERIZATION  10-15-2000   VAGINAL HYSTERECTOMY  01/25/2012   Procedure: HYSTERECTOMY VAGINAL;  Surgeon: Dara Lords, MD;  Location: [facility];  Service: Gynecology;  Laterality: N/A;    Family History  Problem Relation Age of Onset   Hypertension Mother    Cancer Mother        LUG, THROAT, & BACK   Breast cancer Mother    Cancer Father        THROAT   Throat cancer Father    Cancer Maternal Grandmother        KIDNEY   Cancer Maternal Grandfather        COLON   Colon cancer Maternal Grandfather 37   Diabetes Daughter    Stomach cancer Neg Hx     Social History   Socioeconomic History   Marital status: Married    Spouse name: Not on file   Number of children: Not on file   Years of education: Not  on file   Highest education level: Not on file  Occupational History   Not on file  Tobacco Use   Smoking status: Never   Smokeless tobacco: Never  Vaping Use   Vaping status: Never Used  Substance and Sexual Activity   Alcohol use: No    Alcohol/week: 0.0 standard drinks of alcohol   Drug use: No   Sexual activity: Not Currently    Birth control/protection: Surgical    Comment: DES NEG,DECLINED INSURANCE QUESTIONS, Hyst  Other Topics Concern   Not on file  Social History Narrative   Not on file   Social Determinants of Health   Financial Resource Strain: Not on file  Food Insecurity: Not on file  Transportation Needs: Not on file   Physical Activity: Not on file  Stress: Not on file  Social Connections: Not on file  Intimate Partner Violence: Not on file    Review of Systems  All other systems reviewed and are negative.       Objective    BP (!) 128/90   Pulse 75   Temp 98.1 F (36.7 C) (Oral)   Resp 16   Wt 199 lb 12.8 oz (90.6 kg)   LMP 01/03/2012   SpO2 97%   BMI 31.06 kg/m   Physical Exam Vitals and nursing note reviewed.  Constitutional:      General: She is not in acute distress. Cardiovascular:     Rate and Rhythm: Normal rate and regular rhythm.  Pulmonary:     Effort: Pulmonary effort is normal.     Breath sounds: Normal breath sounds.  Abdominal:     Palpations: Abdomen is soft.     Tenderness: There is no abdominal tenderness.  Neurological:     General: No focal deficit present.     Mental Status: She is alert and oriented to person, place, and time.         Assessment & Plan:   1. Type 2 diabetes mellitus without complication, with long-term current use of insulin (HCC) Increasing A1c and now above goal. Discussed compliance. Continue  - HM DIABETES FOOT EXAM - POCT glycosylated hemoglobin (Hb A1C)  2. Hypertension associated with diabetes (HCC) Continue.  - Urine Albumin/Creatinine with ratio (send out) [LAB689] - Renal function panel [LAB19]  3. Hypothyroidism, unspecified type Appears stable. Continue     Return in about 3 months (around 08/19/2023) for follow up.   Tommie Raymond, MD

## 2023-07-20 ENCOUNTER — Other Ambulatory Visit: Payer: Self-pay | Admitting: Family Medicine

## 2023-07-20 DIAGNOSIS — Z1231 Encounter for screening mammogram for malignant neoplasm of breast: Secondary | ICD-10-CM

## 2023-08-09 ENCOUNTER — Ambulatory Visit
Admission: RE | Admit: 2023-08-09 | Discharge: 2023-08-09 | Disposition: A | Discharge status: Home / Self Care | Payer: 59 | Source: Ambulatory Visit | Attending: Family Medicine | Admitting: Family Medicine

## 2023-08-09 DIAGNOSIS — Z1231 Encounter for screening mammogram for malignant neoplasm of breast: Secondary | ICD-10-CM

## 2023-08-13 ENCOUNTER — Other Ambulatory Visit: Payer: Self-pay | Admitting: Family Medicine

## 2023-08-13 MED ORDER — TRULICITY 4.5 MG/0.5ML ~~LOC~~ SOAJ: 4.5000 mg | SUBCUTANEOUS | 0 refills | Status: DC

## 2023-09-21 ENCOUNTER — Encounter: Payer: Self-pay | Admitting: Family Medicine

## 2023-09-21 ENCOUNTER — Telehealth: Payer: Self-pay | Admitting: Family Medicine

## 2023-09-21 NOTE — Telephone Encounter (Signed)
Patient stated she would wait until her appointment in December to discuss with provider about machine

## 2023-09-21 NOTE — Telephone Encounter (Signed)
Patient will need a visit with pcp for her DM before we cn send in new RX

## 2023-11-03 ENCOUNTER — Ambulatory Visit: Payer: 59 | Admitting: Nurse Practitioner

## 2023-11-03 ENCOUNTER — Encounter: Payer: Self-pay | Admitting: Nurse Practitioner

## 2023-11-03 VITALS — BP 116/78 | HR 96 | Resp 14 | Ht 67.5 in | Wt 197.5 lb

## 2023-11-03 DIAGNOSIS — Z01419 Encounter for gynecological examination (general) (routine) without abnormal findings: Secondary | ICD-10-CM | POA: Diagnosis not present

## 2023-11-03 DIAGNOSIS — Z780 Asymptomatic menopausal state: Secondary | ICD-10-CM | POA: Diagnosis not present

## 2023-11-03 NOTE — Progress Notes (Signed)
   [name] [birth year] [id number]   History:  [name] presents for annual exam without GYN complaints. S/P 2013 TVH for dysmenorrhea and menorrhagia. Having some night sweats but says they are tolerable. Normal pap and mammogram history. T2DM, HTN managed by endocrinology.   Gynecologic History Patient's last menstrual period was 01/03/2012.   Contraception/Family planning: status post hysterectomy Sexually active: Yes  Health Maintenance Last Pap: 11/26/2011. Results were: Normal Last mammogram: 08/09/2023. Results were: Normal Last colonoscopy: 10/11/2017. Results were: Normal, 10-year recall Last Dexa: Never  Past medical history, past surgical history, family history and social history were all reviewed and documented in the EPIC chart. Separated. 4 children, all live local. 3 grandchildren ages ages 69-17.   ROS:  A ROS was performed and pertinent positives and negatives are included.  Exam:  Vitals:   11/03/23 1556  BP: 116/78  Pulse: 96  Resp: 14  Weight: 197 lb 8 oz (89.6 kg)  Height: 5' 7.5 (1.715 m)     Body mass index is 30.48 kg/m.  General appearance:  Normal Thyroid :  Symmetrical, normal in size, without palpable masses or nodularity. Respiratory  Auscultation:  Clear without wheezing or rhonchi Cardiovascular  Auscultation:  Regular rate, without rubs, murmurs or gallops  Edema/varicosities:  Not grossly evident Abdominal  Soft,nontender, without masses, guarding or rebound.  Liver/spleen:  No organomegaly noted  Hernia:  None appreciated  Skin  Inspection:  Grossly normal   Breasts: Examined lying and sitting.   Right: Without masses, retractions, discharge or axillary adenopathy.   Left: Without masses, retractions, discharge or axillary adenopathy. Pelvic: External genitalia:  no lesions              Urethra:  normal appearing urethra with no masses, tenderness or lesions              Bartholins and Skenes: normal                  Vagina: normal appearing vagina with normal color and discharge, no lesions              Cervix: absent Bimanual Exam:  Uterus:  absent              Adnexa: no mass, fullness, tenderness              Rectovaginal: Deferred              Anus:  normal, no lesions  Patient informed chaperone available to be present for breast and pelvic exam. Patient has requested no chaperone to be present. Patient has been advised what will be completed during breast and pelvic exam.   Assessment/Plan:  58 y.o. [id number] for annual exam.   Well female exam with routine gynecological exam - Education provided on SBEs, importance of preventative screenings, current guidelines, high calcium  diet, regular exercise, and multivitamin daily. Labs with PCP and endocrinology.   Postmenopausal - no HRT. Night sweats but they are tolerable.   Screening for cervical cancer - Normal Pap history.  No longer screening per guidelines.   Screening for breast cancer - Normal mammogram history.  Continue annual screenings.  Normal breast exam today.  Screening for colon cancer - Normal colonoscopy in 2018. Will repeat at GI's recommended interval.    Screening for osteoporosis - Average risk. Will plan DXA at age 8.   Return in about 1 year (around 11/02/2024) for Annual.   [name], 4:15 PM 11/03/2023

## 2023-11-07 ENCOUNTER — Ambulatory Visit: Payer: 59

## 2023-11-08 ENCOUNTER — Ambulatory Visit: Payer: 59

## 2023-11-23 ENCOUNTER — Ambulatory Visit: Payer: 59 | Admitting: Family Medicine

## 2023-11-23 ENCOUNTER — Encounter: Payer: Self-pay | Admitting: Family Medicine

## 2023-11-23 VITALS — BP 151/107 | HR 88 | Temp 98.1°F | Resp 16 | Ht 68.0 in | Wt 200.4 lb

## 2023-11-23 DIAGNOSIS — Z7985 Long-term (current) use of injectable non-insulin antidiabetic drugs: Secondary | ICD-10-CM

## 2023-11-23 DIAGNOSIS — Z794 Long term (current) use of insulin: Secondary | ICD-10-CM | POA: Diagnosis not present

## 2023-11-23 DIAGNOSIS — E119 Type 2 diabetes mellitus without complications: Secondary | ICD-10-CM | POA: Diagnosis not present

## 2023-11-23 DIAGNOSIS — E039 Hypothyroidism, unspecified: Secondary | ICD-10-CM

## 2023-11-23 DIAGNOSIS — I10 Essential (primary) hypertension: Secondary | ICD-10-CM

## 2023-11-23 LAB — POCT GLYCOSYLATED HEMOGLOBIN (HGB A1C): Hemoglobin A1C: 6.6 % — AB (ref 4.0–5.6)

## 2023-11-23 MED ORDER — TRULICITY 4.5 MG/0.5ML ~~LOC~~ SOAJ: 4.5000 mg | SUBCUTANEOUS | 0 refills | Status: DC

## 2023-11-25 ENCOUNTER — Encounter: Payer: Self-pay | Admitting: Family Medicine

## 2023-11-25 NOTE — Progress Notes (Signed)
Established Patient Office Visit  Subjective    Patient ID: [name], female    DOB: 01/17/1966  Age: 58 y.o. MRN: [id number]  CC:  Chief Complaint  Patient presents with   Follow-up    6 month    HPI FRANCYS BOLIN presents for routine annual exam including diabetes, hypertension, and hypothyroidism. Patient has been out of bp meds. Patient denies acute complaints.   Outpatient Encounter Medications as of 11/23/2023  Medication Sig   Accu-Chek FastClix Lancets MISC Use to check blood sugar twice a day   BD ULTRA-FINE PEN NEEDLES 29G X 12.7MM MISC USE ONCE DAILY AS DIRECTED   glucose blood (ACCU-CHEK GUIDE) test strip USE 1 EACH BY OTHER ROUTE 2 (TWO) TIMES DAILY. E11.9   [DISCONTINUED] Dulaglutide (TRULICITY) 4.5 MG/0.5ML SOPN Inject 4.5 mg as directed once a week.   albuterol (VENTOLIN HFA) 108 (90 Base) MCG/ACT inhaler Inhale 2 puffs into the lungs every 6 (six) hours as needed for wheezing or shortness of breath. (Patient not taking: Reported on 11/23/2023)   Blood Glucose Monitoring Suppl (ACCU-CHEK GUIDE) w/Device KIT Please specify directions, refills and quantity (Patient taking differently: 1 each by Other route daily. E11.9)   dextromethorphan-guaiFENesin (MUCINEX DM) 30-600 MG 12hr tablet Take 1 tablet by mouth 2 (two) times daily. (Patient not taking: Reported on 11/23/2023)   Dulaglutide (TRULICITY) 4.5 MG/0.5ML SOAJ Inject 4.5 mg as directed once a week.   ibuprofen (ADVIL) 800 MG tablet Take 1 tablet (800 mg total) by mouth every 8 (eight) hours as needed (pain). (Patient not taking: Reported on 11/23/2023)   lisinopril (ZESTRIL) 10 MG tablet Take 1 tablet (10 mg total) by mouth daily. (Patient not taking: Reported on 11/23/2023)   meloxicam (MOBIC) 15 MG tablet Take 15 mg by mouth 3 (three) times daily. (Patient not taking: Reported on 11/23/2023)   oseltamivir (TAMIFLU) 75 MG capsule Take 1 capsule (75 mg total) by mouth every 12 (twelve) hours. (Patient not  taking: Reported on 11/23/2023)   Spacer/Aero-Holding Chambers DEVI 1 Units by Does not apply route in the morning, at noon, and at bedtime. (Patient not taking: Reported on 11/23/2023)   No facility-administered encounter medications on file as of 11/23/2023.    Past Medical History:  Diagnosis Date   Acute meniscal tear of left knee    Diabetes mellitus without complication (HCC)    History of hypothyroidism    Prediabetes 09/20/2017   Elevated blood glucose since 2013 possibly prior.    Past Surgical History:  Procedure Laterality Date   HYSTEROSCOPY W/ THERMACHOICE ENDOMETRIAL ABLATION  03-16-2007   KNEE ARTHROSCOPY Right    KNEE ARTHROSCOPY WITH MEDIAL MENISECTOMY Left 05/17/2015   Procedure: LEFT KNEE ARTHROSCOPY WITH MEDIAL MENISECTOMY, CHONDROPLASTY PATELLA;  Surgeon: Ranee Gosselin, MD;  Location: [facility] [place];  Service: Orthopedics;  Laterality: Left;   LAPAROSOCPY BILATERAL TUBAL LIGATION W/ CAUTERIZATION  10-15-2000   VAGINAL HYSTERECTOMY  01/25/2012   Procedure: HYSTERECTOMY VAGINAL;  Surgeon: Dara Lords, MD;  Location: [facility];  Service: Gynecology;  Laterality: N/A;    Family History  Problem Relation Age of Onset   Hypertension Mother    Cancer Mother        LUG, THROAT, & BACK   Breast cancer Mother    Cancer Father        THROAT   Throat cancer Father    Cancer Maternal Grandmother        KIDNEY   Cancer Maternal Grandfather  COLON   Colon cancer Maternal Grandfather 45   Diabetes Daughter    Stomach cancer Neg Hx     Social History   Socioeconomic History   Marital status: Married    Spouse name: Not on file   Number of children: Not on file   Years of education: Not on file   Highest education level: Not on file  Occupational History   Not on file  Tobacco Use   Smoking status: Never   Smokeless tobacco: Never  Vaping Use   Vaping status: Never Used  Substance and Sexual Activity   Alcohol use: No    Alcohol/week:  0.0 standard drinks of alcohol   Drug use: No   Sexual activity: Not Currently    Birth control/protection: Surgical    Comment: DES NEG,DECLINED INSURANCE QUESTIONS, Hyst  Other Topics Concern   Not on file  Social History Narrative   Not on file   Social Drivers of Health   Financial Resource Strain: Not on file  Food Insecurity: No Food Insecurity (11/23/2023)   Hunger Vital Sign    Worried About Running Out of Food in the Last Year: Never true    Ran Out of Food in the Last Year: Never true  Transportation Needs: No Transportation Needs (11/23/2023)   PRAPARE - Administrator, Civil Service (Medical): No    Lack of Transportation (Non-Medical): No  Physical Activity: Not on file  Stress: Not on file  Social Connections: Moderately Integrated (11/23/2023)   Social Connection and Isolation Panel [NHANES]    Frequency of Communication with Friends and Family: More than three times a week    Frequency of Social Gatherings with Friends and Family: Once a week    Attends Religious Services: More than 4 times per year    Active Member of [employer] or Organizations: Yes    Attends Banker Meetings: More than 4 times per year    Marital Status: Divorced  Intimate Partner Violence: Not At Risk (11/23/2023)   Humiliation, Afraid, Rape, and Kick questionnaire    Fear of Current or Ex-Partner: No    Emotionally Abused: No    Physically Abused: No    Sexually Abused: No    Review of Systems  All other systems reviewed and are negative.       Objective    BP (!) 151/107 (BP Location: Right Arm, Patient Position: Sitting)   Pulse 88   Temp 98.1 F (36.7 C) (Oral)   Resp 16   Ht 5\' 8"  (1.727 m)   Wt 200 lb 6.4 oz (90.9 kg)   LMP 01/03/2012   SpO2 95%   BMI 30.47 kg/m   Physical Exam Vitals and nursing note reviewed.  Constitutional:      General: She is not in acute distress. Cardiovascular:     Rate and Rhythm: Normal rate and regular rhythm.   Pulmonary:     Effort: Pulmonary effort is normal.     Breath sounds: Normal breath sounds.  Abdominal:     Palpations: Abdomen is soft.     Tenderness: There is no abdominal tenderness.  Neurological:     General: No focal deficit present.     Mental Status: She is alert and oriented to person, place, and time.         Assessment & Plan:   1. Type 2 diabetes mellitus without complication, with long-term current use of insulin (HCC) (Primary) Imiproved A1c and now at goal. Continue  -  HgB A1c  2. Essential hypertension Elevated readings. Has not taken meds for at least a week or more. Discussed compliance. Continue   3. Hypothyroidism, unspecified type Continue   4. Long-term current use of injectable noninsulin antidiabetic medication     Return in about 3 months (around 02/21/2024) for chronic med issues, follow up.   Tommie Raymond, MD

## 2024-01-05 ENCOUNTER — Other Ambulatory Visit: Payer: Self-pay | Admitting: Family Medicine

## 2024-01-12 ENCOUNTER — Encounter: Payer: Self-pay | Admitting: Family Medicine

## 2024-02-10 ENCOUNTER — Other Ambulatory Visit: Payer: Self-pay | Admitting: Family Medicine

## 2024-02-21 ENCOUNTER — Ambulatory Visit: Payer: 59 | Admitting: Family Medicine

## 2024-02-21 ENCOUNTER — Encounter: Payer: Self-pay | Admitting: Family Medicine

## 2024-02-21 VITALS — BP 137/93 | HR 100 | Wt 198.0 lb

## 2024-02-21 DIAGNOSIS — Z794 Long term (current) use of insulin: Secondary | ICD-10-CM

## 2024-02-21 DIAGNOSIS — E119 Type 2 diabetes mellitus without complications: Secondary | ICD-10-CM

## 2024-02-21 DIAGNOSIS — E039 Hypothyroidism, unspecified: Secondary | ICD-10-CM | POA: Diagnosis not present

## 2024-02-21 DIAGNOSIS — Z7985 Long-term (current) use of injectable non-insulin antidiabetic drugs: Secondary | ICD-10-CM | POA: Diagnosis not present

## 2024-02-21 DIAGNOSIS — I10 Essential (primary) hypertension: Secondary | ICD-10-CM | POA: Diagnosis not present

## 2024-02-21 MED ORDER — LISINOPRIL 10 MG PO TABS: 10.0000 mg | ORAL_TABLET | Freq: Every day | ORAL | 0 refills | Status: DC

## 2024-02-21 MED ORDER — TRULICITY 4.5 MG/0.5ML ~~LOC~~ SOAJ: SUBCUTANEOUS | 0 refills | Status: DC

## 2024-02-22 NOTE — Progress Notes (Signed)
 Established Patient Office Visit  Subjective    Patient ID: [name], female    DOB: 1966-10-10  Age: 58 y.o. MRN: [id number]  CC:  Chief Complaint  Patient presents with   Medical Management of Chronic Issues   Emesis   Medication Refill    HPI [name] presents for routine follow up of chronic med issues including diabetes and hypertension. Patient reports med compliance and denies acute complaints.   Outpatient Encounter Medications as of 02/21/2024  Medication Sig   [DISCONTINUED] TRULICITY  4.5 MG/0.5ML SOAJ INJECT 4.5 MG INTO THE SKIN ONCE A WEEK AS DIRECTED   Accu-Chek FastClix Lancets MISC Use to check blood sugar twice a day   albuterol  (VENTOLIN  HFA) 108 (90 Base) MCG/ACT inhaler Inhale 2 puffs into the lungs every 6 (six) hours as needed for wheezing or shortness of breath. (Patient not taking: Reported on 11/23/2023)   BD ULTRA-FINE PEN NEEDLES 29G X 12.7MM MISC USE ONCE DAILY AS DIRECTED   Blood Glucose Monitoring Suppl (ACCU-CHEK GUIDE) w/Device KIT Please specify directions, refills and quantity (Patient taking differently: 1 each by Other route daily. E11.9)   dextromethorphan-guaiFENesin  (MUCINEX  DM) 30-600 MG 12hr tablet Take 1 tablet by mouth 2 (two) times daily. (Patient not taking: Reported on 11/23/2023)   Dulaglutide  (TRULICITY ) 4.5 MG/0.5ML SOAJ INJECT 4.5 MG INTO THE SKIN ONCE A WEEK AS DIRECTED   glucose blood (ACCU-CHEK GUIDE) test strip USE 1 EACH BY OTHER ROUTE 2 (TWO) TIMES DAILY. E11.9   ibuprofen  (ADVIL ) 800 MG tablet Take 1 tablet (800 mg total) by mouth every 8 (eight) hours as needed (pain). (Patient not taking: Reported on 11/23/2023)   lisinopril  (ZESTRIL ) 10 MG tablet Take 1 tablet (10 mg total) by mouth daily.   meloxicam  (MOBIC ) 15 MG tablet Take 15 mg by mouth 3 (three) times daily. (Patient not taking: Reported on 11/23/2023)   oseltamivir  (TAMIFLU ) 75 MG capsule Take 1 capsule (75 mg total) by mouth every 12 (twelve) hours.  (Patient not taking: Reported on 11/23/2023)   Spacer/Aero-Holding Chambers DEVI 1 Units by Does not apply route in the morning, at noon, and at bedtime. (Patient not taking: Reported on 11/23/2023)   [DISCONTINUED] lisinopril  (ZESTRIL ) 10 MG tablet Take 1 tablet (10 mg total) by mouth daily. (Patient not taking: Reported on 11/23/2023)   No facility-administered encounter medications on file as of 02/21/2024.    Past Medical History:  Diagnosis Date   Acute meniscal tear of left knee    Diabetes mellitus without complication (HCC)    History of hypothyroidism    Prediabetes 09/20/2017   Elevated blood glucose since 2013 possibly prior.    Past Surgical History:  Procedure Laterality Date   HYSTEROSCOPY W/ THERMACHOICE ENDOMETRIAL ABLATION  03-16-2007   KNEE ARTHROSCOPY Right    KNEE ARTHROSCOPY WITH MEDIAL MENISECTOMY Left 05/17/2015   Procedure: LEFT KNEE ARTHROSCOPY WITH MEDIAL MENISECTOMY, CHONDROPLASTY PATELLA;  Surgeon: Hazle Lites, MD;  Location: [facility] [place];  Service: Orthopedics;  Laterality: Left;   LAPAROSOCPY BILATERAL TUBAL LIGATION W/ CAUTERIZATION  10-15-2000   VAGINAL HYSTERECTOMY  01/25/2012   Procedure: HYSTERECTOMY VAGINAL;  Surgeon: Lacretia Piccolo, MD;  Location: [facility];  Service: Gynecology;  Laterality: N/A;    Family History  Problem Relation Age of Onset   Hypertension Mother    Cancer Mother        LUG, THROAT, & BACK   Breast cancer Mother    Cancer Father  THROAT   Throat cancer Father    Cancer Maternal Grandmother        KIDNEY   Cancer Maternal Grandfather        COLON   Colon cancer Maternal Grandfather 72   Diabetes Daughter    Stomach cancer Neg Hx     Social History   Socioeconomic History   Marital status: Married    Spouse name: Not on file   Number of children: Not on file   Years of education: Not on file   Highest education level: Not on file  Occupational History   Not on file  Tobacco Use   Smoking  status: Never   Smokeless tobacco: Never  Vaping Use   Vaping status: Never Used  Substance and Sexual Activity   Alcohol use: No    Alcohol/week: 0.0 standard drinks of alcohol   Drug use: No   Sexual activity: Not Currently    Birth control/protection: Surgical    Comment: DES NEG,DECLINED INSURANCE QUESTIONS, Hyst  Other Topics Concern   Not on file  Social History Narrative   Not on file   Social Drivers of Health   Financial Resource Strain: Not on file  Food Insecurity: No Food Insecurity (11/23/2023)   Hunger Vital Sign    Worried About Running Out of Food in the Last Year: Never true    Ran Out of Food in the Last Year: Never true  Transportation Needs: No Transportation Needs (11/23/2023)   PRAPARE - Administrator, Civil Service (Medical): No    Lack of Transportation (Non-Medical): No  Physical Activity: Not on file  Stress: Not on file  Social Connections: Moderately Integrated (11/23/2023)   Social Connection and Isolation Panel [NHANES]    Frequency of Communication with Friends and Family: More than three times a week    Frequency of Social Gatherings with Friends and Family: Once a week    Attends Religious Services: More than 4 times per year    Active Member of [employer] or Organizations: Yes    Attends Banker Meetings: More than 4 times per year    Marital Status: Divorced  Intimate Partner Violence: Not At Risk (11/23/2023)   Humiliation, Afraid, Rape, and Kick questionnaire    Fear of Current or Ex-Partner: No    Emotionally Abused: No    Physically Abused: No    Sexually Abused: No    Review of Systems  All other systems reviewed and are negative.       Objective    BP (!) 137/93 (BP Location: Right Arm, Patient Position: Sitting, Cuff Size: Normal)   Pulse 100   Wt 198 lb (89.8 kg)   LMP 01/03/2012   SpO2 96%   BMI 30.11 kg/m   Physical Exam Vitals and nursing note reviewed.  Constitutional:      General: She is  not in acute distress. Cardiovascular:     Rate and Rhythm: Normal rate and regular rhythm.  Pulmonary:     Effort: Pulmonary effort is normal.     Breath sounds: Normal breath sounds.  Abdominal:     Palpations: Abdomen is soft.     Tenderness: There is no abdominal tenderness.  Neurological:     General: No focal deficit present.     Mental Status: She is alert and oriented to person, place, and time.         Assessment & Plan:   Type 2 diabetes mellitus without complication, with long-term  current use of insulin  (HCC)  Essential hypertension  Hypothyroidism, unspecified type  Long-term current use of injectable noninsulin antidiabetic medication  Encounter for long-term (current) use of insulin  (HCC)  Other orders -     Lisinopril ; Take 1 tablet (10 mg total) by mouth daily.  Dispense: 90 tablet; Refill: 0 -     Trulicity ; INJECT 4.5 MG INTO THE SKIN ONCE A WEEK AS DIRECTED  Dispense: 6 mL; Refill: 0     Return in about 3 months (around 05/22/2024) for follow up, chronic med issues.   Arlo Lama, MD

## 2024-02-24 ENCOUNTER — Encounter: Payer: Self-pay | Admitting: Family Medicine

## 2024-04-10 ENCOUNTER — Ambulatory Visit

## 2024-04-11 ENCOUNTER — Encounter: Payer: Self-pay | Admitting: Family Medicine

## 2024-04-22 ENCOUNTER — Inpatient Hospital Stay
Admission: RE | Admit: 2024-04-22 | Discharge: 2024-04-22 | Discharge status: Home / Self Care | Source: Ambulatory Visit | Attending: Physician Assistant | Admitting: Physician Assistant

## 2024-04-22 VITALS — BP 131/89 | HR 82 | Temp 98.0°F | Resp 18 | Wt 198.0 lb

## 2024-04-22 DIAGNOSIS — J029 Acute pharyngitis, unspecified: Secondary | ICD-10-CM | POA: Diagnosis not present

## 2024-04-22 MED ORDER — PREDNISONE 20 MG PO TABS
40.0000 mg | ORAL_TABLET | Freq: Every day | ORAL | 0 refills | Status: AC
Start: 2024-04-22 — End: 2024-04-27

## 2024-04-22 NOTE — ED Triage Notes (Signed)
 Cold symptoms - Entered by patient  Pt presents c/o sore throat x 2 weeks constantly. Pt says she had a cold two weeks ago with cough and congestion. All sxs have improved but the sore throat.

## 2024-04-22 NOTE — Discharge Instructions (Signed)
Please follow up if no gradual improvement or with any further concerns.

## 2024-04-24 NOTE — ED Provider Notes (Signed)
 [facility]    CSN: [id number] Arrival date & time: 04/22/24  1136      History   Chief Complaint Chief Complaint  Patient presents with   Sore Throat    Cold symptoms - Entered by patient   URI    HPI [name] is a 58 y.o. female.   Patient presents today for evaluation of sore throat she has had for 2 weeks.  She reports initially she had cold symptoms but this has resolved and she no longer has cough or congestion.  She reports sore throat has remained.  She denies any fever.  The history is provided by the patient.  Sore Throat Pertinent negatives include no abdominal pain and no shortness of breath.  URI Presenting symptoms: sore throat   Presenting symptoms: no congestion, no cough, no ear pain and no fever   Associated symptoms: no wheezing     Past Medical History:  Diagnosis Date   Acute meniscal tear of left knee    Diabetes mellitus without complication (HCC)    History of hypothyroidism    Prediabetes Oct 16, 2017   Elevated blood glucose since May 27, 2012 possibly prior.    Patient Active Problem List   Diagnosis Date Noted   Chronic midline low back pain without sciatica 02/13/2018   Mixed diabetic hyperlipidemia associated with type 2 diabetes mellitus (HCC) 10/25/2017   Hypertension associated with diabetes (HCC) 10/25/2017   Diabetes mellitus, type II (HCC) 10/25/2017   H/O total hysterectomy- benign reasons 10/16/17   Left knee pain- Dr Camella 2017/10/16   Perimenopausal 10-16-2017   Family history of breast cancer in mother-deceased late 05-27-24 October 16, 2017   Family history of colon cancer- MGF- in 60's 2017-10-16   Blood glucose elevated 2017/10/16   Elevated alanine aminotransferase (ALT) level 10/16/17   Low HDL (under 40) 10-16-2017   Severe obesity (BMI 35.0-35.9 with comorbidity) (HCC) 2017/10/16   Vitamin D  deficiency 16-Oct-2017   Family history of diabetes mellitus (DM)- in daughter 10/16/2017   Hypothyroidism 02/08/2013     Past Surgical History:  Procedure Laterality Date   HYSTEROSCOPY W/ THERMACHOICE ENDOMETRIAL ABLATION  03-16-2007   KNEE ARTHROSCOPY Right    KNEE ARTHROSCOPY WITH MEDIAL MENISECTOMY Left 05/17/2015   Procedure: LEFT KNEE ARTHROSCOPY WITH MEDIAL MENISECTOMY, CHONDROPLASTY PATELLA;  Surgeon: Tanda Heading, MD;  Location: [facility];  Service: Orthopedics;  Laterality: Left;   LAPAROSOCPY BILATERAL TUBAL LIGATION W/ CAUTERIZATION  10-15-2000   VAGINAL HYSTERECTOMY  01/25/2012   Procedure: HYSTERECTOMY VAGINAL;  Surgeon: Evalene SHAUNNA Organ, MD;  Location: [facility];  Service: Gynecology;  Laterality: N/A;    OB History     Gravida  7   Para  4   Term      Preterm      AB  3   Living  4      SAB  3   IAB      Ectopic      Multiple      Live Births               Home Medications    Prior to Admission medications   Medication Sig Start Date End Date Taking? Authorizing Provider  predniSONE  (DELTASONE ) 20 MG tablet Take 2 tablets (40 mg total) by mouth daily with breakfast for 5 days. 04/22/24 04/27/24 Yes Billy Asberry FALCON, PA-C  Accu-Chek FastClix Lancets MISC Use to check blood sugar twice a day 01/29/23   Tanda Bleacher, MD  albuterol  (VENTOLIN  HFA)  108 (90 Base) MCG/ACT inhaler Inhale 2 puffs into the lungs every 6 (six) hours as needed for wheezing or shortness of breath. Patient not taking: Reported on 11/23/2023 01/01/23   Lynwood Lenis, PA-C  BD ULTRA-FINE PEN NEEDLES 29G X 12.7MM MISC USE ONCE DAILY AS DIRECTED 11/25/20   Kassie Mallick, MD  Blood Glucose Monitoring Suppl (ACCU-CHEK GUIDE) w/Device KIT Please specify directions, refills and quantity Patient taking differently: 1 each by Other route daily. E11.9 12/23/18   Kassie Mallick, MD  dextromethorphan-guaiFENesin  (MUCINEX  DM) 30-600 MG 12hr tablet Take 1 tablet by mouth 2 (two) times daily. Patient not taking: Reported on 11/23/2023 01/01/23   Lynwood Lenis, PA-C  Dulaglutide  (TRULICITY ) 4.5 MG/0.5ML  SOAJ INJECT 4.5 MG INTO THE SKIN ONCE A WEEK AS DIRECTED 02/21/24   Tanda Bleacher, MD  glucose blood (ACCU-CHEK GUIDE) test strip USE 1 EACH BY OTHER ROUTE 2 (TWO) TIMES DAILY. E11.9 01/29/23   Tanda Bleacher, MD  ibuprofen  (ADVIL ) 800 MG tablet Take 1 tablet (800 mg total) by mouth every 8 (eight) hours as needed (pain). Patient not taking: Reported on 11/23/2023 04/30/22   Vonna Sharlet POUR, MD  lisinopril  (ZESTRIL ) 10 MG tablet Take 1 tablet (10 mg total) by mouth daily. 02/21/24   Tanda Bleacher, MD  meloxicam  (MOBIC ) 15 MG tablet Take 15 mg by mouth 3 (three) times daily. Patient not taking: Reported on 11/23/2023 05/14/23   [provider]  oseltamivir  (TAMIFLU ) 75 MG capsule Take 1 capsule (75 mg total) by mouth every 12 (twelve) hours. Patient not taking: Reported on 11/23/2023 01/01/23   Lynwood Lenis, PA-C  Spacer/Aero-Holding Chambers DEVI 1 Units by Does not apply route in the morning, at noon, and at bedtime. Patient not taking: Reported on 11/23/2023 01/01/23   Lynwood Lenis, PA-C    Family History Family History  Problem Relation Age of Onset   Hypertension Mother    Cancer Mother        LUG, THROAT, & BACK   Breast cancer Mother    Cancer Father        THROAT   Throat cancer Father    Cancer Maternal Grandmother        KIDNEY   Cancer Maternal Grandfather        COLON   Colon cancer Maternal Grandfather 55   Diabetes Daughter    Stomach cancer Neg Hx     Social History Social History   Tobacco Use   Smoking status: Never    Passive exposure: Never   Smokeless tobacco: Never  Vaping Use   Vaping status: Never Used  Substance Use Topics   Alcohol use: No    Alcohol/week: 0.0 standard drinks of alcohol   Drug use: No     Allergies   Adhesive [tape]   Review of Systems Review of Systems  Constitutional:  Negative for chills and fever.  HENT:  Positive for sore throat. Negative for congestion, ear pain and sinus pressure.   Eyes:  Negative for discharge  and redness.  Respiratory:  Negative for cough, shortness of breath and wheezing.   Gastrointestinal:  Negative for abdominal pain, diarrhea, nausea and vomiting.     Physical Exam Triage Vital Signs ED Triage Vitals  Encounter Vitals Group     BP 04/22/24 1153 131/89     Girls Systolic BP Percentile --      Girls Diastolic BP Percentile --      Boys Systolic BP Percentile --      Boys Diastolic  BP Percentile --      Pulse Rate 04/22/24 1153 82     Resp 04/22/24 1153 18     Temp 04/22/24 1153 98 F (36.7 C)     Temp Source 04/22/24 1153 Oral     SpO2 04/22/24 1153 97 %     Weight 04/22/24 1152 197 lb 15.6 oz (89.8 kg)     Height --      Head Circumference --      Peak Flow --      Pain Score 04/22/24 1151 9     Pain Loc --      Pain Education --      Exclude from Growth Chart --    No data found.  Updated Vital Signs BP 131/89 (BP Location: Left Arm)   Pulse 82   Temp 98 F (36.7 C) (Oral)   Resp 18   Wt 197 lb 15.6 oz (89.8 kg)   LMP 01/03/2012   SpO2 97%   BMI 30.10 kg/m   Visual Acuity Right Eye Distance:   Left Eye Distance:   Bilateral Distance:    Right Eye Near:   Left Eye Near:    Bilateral Near:     Physical Exam Vitals and nursing note reviewed.  Constitutional:      General: She is not in acute distress.    Appearance: Normal appearance. She is not ill-appearing.  HENT:     Head: Normocephalic and atraumatic.     Right Ear: Tympanic membrane normal.     Left Ear: Tympanic membrane normal.     Nose: No congestion.     Mouth/Throat:     Mouth: Mucous membranes are moist.     Pharynx: No oropharyngeal exudate or posterior oropharyngeal erythema.   Eyes:     Conjunctiva/sclera: Conjunctivae normal.    Cardiovascular:     Rate and Rhythm: Normal rate and regular rhythm.     Heart sounds: Normal heart sounds. No murmur heard. Pulmonary:     Effort: Pulmonary effort is normal. No respiratory distress.     Breath sounds: Normal breath  sounds. No wheezing, rhonchi or rales.   Skin:    General: Skin is warm and dry.   Neurological:     Mental Status: She is alert.   Psychiatric:        Mood and Affect: Mood normal.        Thought Content: Thought content normal.      UC Treatments / Results  Labs (all labs ordered are listed, but only abnormal results are displayed) Labs Reviewed - No data to display  EKG   Radiology No results found.  Procedures Procedures (including critical care time)  Medications Ordered in UC Medications - No data to display  Initial Impression / Assessment and Plan / UC Course  I have reviewed the triage vital signs and the nursing notes.  Pertinent labs & imaging results that were available during my care of the patient were reviewed by me and considered in my medical decision making (see chart for details).    Given continued sore throat suspect likely inflammatory pharyngitis and will treat with steroid burst.  Advised follow-up if no gradual improvement or with any further concerns.  Low suspicion for bacterial cause of symptoms given lack of fever.  Final Clinical Impressions(s) / UC Diagnoses   Final diagnoses:  Acute pharyngitis, unspecified etiology     Discharge Instructions       Please follow up if no  gradual improvement or with any further concerns.     ED Prescriptions     Medication Sig Dispense Auth. Provider   predniSONE  (DELTASONE ) 20 MG tablet Take 2 tablets (40 mg total) by mouth daily with breakfast for 5 days. 10 tablet Billy Asberry FALCON, PA-C      PDMP not reviewed this encounter.   Billy Asberry FALCON, PA-C 04/24/24 1907

## 2024-05-29 ENCOUNTER — Encounter: Payer: Self-pay | Admitting: Family Medicine

## 2024-05-29 ENCOUNTER — Ambulatory Visit: Admitting: Family Medicine

## 2024-05-29 VITALS — BP 138/91 | HR 74 | Ht 68.0 in | Wt 189.0 lb

## 2024-05-29 DIAGNOSIS — Z794 Long term (current) use of insulin: Secondary | ICD-10-CM | POA: Diagnosis not present

## 2024-05-29 DIAGNOSIS — Z7985 Long-term (current) use of injectable non-insulin antidiabetic drugs: Secondary | ICD-10-CM

## 2024-05-29 DIAGNOSIS — E119 Type 2 diabetes mellitus without complications: Secondary | ICD-10-CM

## 2024-05-29 DIAGNOSIS — I10 Essential (primary) hypertension: Secondary | ICD-10-CM

## 2024-05-29 DIAGNOSIS — E039 Hypothyroidism, unspecified: Secondary | ICD-10-CM

## 2024-05-29 MED ORDER — HYDROCHLOROTHIAZIDE 12.5 MG PO CAPS: 12.5000 mg | ORAL_CAPSULE | Freq: Every day | ORAL | 1 refills | Status: DC

## 2024-05-29 NOTE — Progress Notes (Unsigned)
 Established Patient Office Visit  Subjective    Patient ID: [name], female    DOB: 12-28-1965  Age: 58 y.o. MRN: [id number]  CC:  Chief Complaint  Patient presents with   Medical Management of Chronic Issues    HPI [name] presents for routine follow up of chronic med issues including diabetes and hypertension. Patient reports med compliance and denies acute complaints   Outpatient Encounter Medications as of 05/29/2024  Medication Sig   Accu-Chek FastClix Lancets MISC Use to check blood sugar twice a day   BD ULTRA-FINE PEN NEEDLES 29G X 12.7MM MISC USE ONCE DAILY AS DIRECTED   Dulaglutide  (TRULICITY ) 4.5 MG/0.5ML SOAJ INJECT 4.5 MG INTO THE SKIN ONCE A WEEK AS DIRECTED   glucose blood (ACCU-CHEK GUIDE) test strip USE 1 EACH BY OTHER ROUTE 2 (TWO) TIMES DAILY. E11.9   hydrochlorothiazide  (MICROZIDE ) 12.5 MG capsule Take 1 capsule (12.5 mg total) by mouth daily.   lisinopril  (ZESTRIL ) 10 MG tablet Take 1 tablet (10 mg total) by mouth daily.   albuterol  (VENTOLIN  HFA) 108 (90 Base) MCG/ACT inhaler Inhale 2 puffs into the lungs every 6 (six) hours as needed for wheezing or shortness of breath. (Patient not taking: Reported on 11/23/2023)   Blood Glucose Monitoring Suppl (ACCU-CHEK GUIDE) w/Device KIT Please specify directions, refills and quantity (Patient taking differently: 1 each by Other route daily. E11.9)   dextromethorphan-guaiFENesin  (MUCINEX  DM) 30-600 MG 12hr tablet Take 1 tablet by mouth 2 (two) times daily. (Patient not taking: Reported on 11/23/2023)   ibuprofen  (ADVIL ) 800 MG tablet Take 1 tablet (800 mg total) by mouth every 8 (eight) hours as needed (pain). (Patient not taking: Reported on 11/23/2023)   meloxicam  (MOBIC ) 15 MG tablet Take 15 mg by mouth 3 (three) times daily. (Patient not taking: Reported on 11/23/2023)   oseltamivir  (TAMIFLU ) 75 MG capsule Take 1 capsule (75 mg total) by mouth every 12 (twelve) hours. (Patient not taking: Reported on  11/23/2023)   Spacer/Aero-Holding Chambers DEVI 1 Units by Does not apply route in the morning, at noon, and at bedtime. (Patient not taking: Reported on 11/23/2023)   No facility-administered encounter medications on file as of 05/29/2024.    Past Medical History:  Diagnosis Date   Acute meniscal tear of left knee    Diabetes mellitus without complication (HCC)    History of hypothyroidism    Prediabetes 09/20/2017   Elevated blood glucose since 2013 possibly prior.    Past Surgical History:  Procedure Laterality Date   HYSTEROSCOPY W/ THERMACHOICE ENDOMETRIAL ABLATION  03-16-2007   KNEE ARTHROSCOPY Right    KNEE ARTHROSCOPY WITH MEDIAL MENISECTOMY Left 05/17/2015   Procedure: LEFT KNEE ARTHROSCOPY WITH MEDIAL MENISECTOMY, CHONDROPLASTY PATELLA;  Surgeon: Tanda Heading, MD;  Location: [facility] [place];  Service: Orthopedics;  Laterality: Left;   LAPAROSOCPY BILATERAL TUBAL LIGATION W/ CAUTERIZATION  10-15-2000   VAGINAL HYSTERECTOMY  01/25/2012   Procedure: HYSTERECTOMY VAGINAL;  Surgeon: Evalene SHAUNNA Organ, MD;  Location: [facility];  Service: Gynecology;  Laterality: N/A;    Family History  Problem Relation Age of Onset   Hypertension Mother    Cancer Mother        LUG, THROAT, & BACK   Breast cancer Mother    Cancer Father        THROAT   Throat cancer Father    Cancer Maternal Grandmother        KIDNEY   Cancer Maternal Grandfather  COLON   Colon cancer Maternal Grandfather 54   Diabetes Daughter    Stomach cancer Neg Hx     Social History   Socioeconomic History   Marital status: Married    Spouse name: Not on file   Number of children: Not on file   Years of education: Not on file   Highest education level: Not on file  Occupational History   Not on file  Tobacco Use   Smoking status: Never    Passive exposure: Never   Smokeless tobacco: Never  Vaping Use   Vaping status: Never Used  Substance and Sexual Activity   Alcohol use: No     Alcohol/week: 0.0 standard drinks of alcohol   Drug use: No   Sexual activity: Not Currently    Birth control/protection: Surgical    Comment: DES NEG,DECLINED INSURANCE QUESTIONS, Hyst  Other Topics Concern   Not on file  Social History Narrative   Not on file   Social Drivers of Health   Financial Resource Strain: Not on file  Food Insecurity: No Food Insecurity (11/23/2023)   Hunger Vital Sign    Worried About Running Out of Food in the Last Year: Never true    Ran Out of Food in the Last Year: Never true  Transportation Needs: No Transportation Needs (11/23/2023)   PRAPARE - Administrator, Civil Service (Medical): No    Lack of Transportation (Non-Medical): No  Physical Activity: Not on file  Stress: Not on file  Social Connections: Moderately Integrated (11/23/2023)   Social Connection and Isolation Panel    Frequency of Communication with Friends and Family: More than three times a week    Frequency of Social Gatherings with Friends and Family: Once a week    Attends Religious Services: More than 4 times per year    Active Member of [employer] or Organizations: Yes    Attends Banker Meetings: More than 4 times per year    Marital Status: Divorced  Intimate Partner Violence: Not At Risk (11/23/2023)   Humiliation, Afraid, Rape, and Kick questionnaire    Fear of Current or Ex-Partner: No    Emotionally Abused: No    Physically Abused: No    Sexually Abused: No    Review of Systems  All other systems reviewed and are negative.       Objective    BP (!) 138/91   Pulse 74   Ht 5' 8 (1.727 m)   Wt 189 lb (85.7 kg)   LMP 01/03/2012   SpO2 93%   BMI 28.74 kg/m   Physical Exam Vitals and nursing note reviewed.  Constitutional:      General: She is not in acute distress. Cardiovascular:     Rate and Rhythm: Normal rate and regular rhythm.  Pulmonary:     Effort: Pulmonary effort is normal.     Breath sounds: Normal breath sounds.   Abdominal:     Palpations: Abdomen is soft.     Tenderness: There is no abdominal tenderness.  Neurological:     General: No focal deficit present.     Mental Status: She is alert and oriented to person, place, and time.         Assessment & Plan:   Type 2 diabetes mellitus without complication, with long-term current use of insulin  (HCC)  Essential hypertension  Hypothyroidism, unspecified type  Long-term current use of injectable noninsulin antidiabetic medication  Other orders -     hydroCHLOROthiazide ;  Take 1 capsule (12.5 mg total) by mouth daily.  Dispense: 90 capsule; Refill: 1  Appears stable. Continue   Return in about 6 months (around 11/29/2024) for physical.   Tanda Raguel SQUIBB, MD

## 2024-05-30 ENCOUNTER — Encounter: Payer: Self-pay | Admitting: Family Medicine

## 2024-05-30 LAB — POCT GLYCOSYLATED HEMOGLOBIN (HGB A1C): HbA1c, POC (controlled diabetic range): 6.1 % (ref 0.0–7.0)

## 2024-05-31 ENCOUNTER — Ambulatory Visit: Admitting: Family Medicine

## 2024-06-09 ENCOUNTER — Other Ambulatory Visit: Payer: Self-pay | Admitting: Family Medicine

## 2024-06-09 DIAGNOSIS — E119 Type 2 diabetes mellitus without complications: Secondary | ICD-10-CM

## 2024-07-27 ENCOUNTER — Other Ambulatory Visit: Payer: Self-pay | Admitting: Family Medicine

## 2024-08-18 ENCOUNTER — Other Ambulatory Visit: Payer: Self-pay | Admitting: Family Medicine

## 2024-08-18 DIAGNOSIS — Z1231 Encounter for screening mammogram for malignant neoplasm of breast: Secondary | ICD-10-CM

## 2024-08-24 ENCOUNTER — Ambulatory Visit
Admission: RE | Admit: 2024-08-24 | Discharge: 2024-08-24 | Disposition: A | Discharge status: Home / Self Care | Source: Ambulatory Visit | Attending: Family Medicine | Admitting: Family Medicine

## 2024-08-24 DIAGNOSIS — Z1231 Encounter for screening mammogram for malignant neoplasm of breast: Secondary | ICD-10-CM

## 2024-08-29 ENCOUNTER — Other Ambulatory Visit: Payer: Self-pay | Admitting: Family Medicine

## 2024-08-29 DIAGNOSIS — R928 Other abnormal and inconclusive findings on diagnostic imaging of breast: Secondary | ICD-10-CM

## 2024-09-06 ENCOUNTER — Ambulatory Visit
Admission: RE | Admit: 2024-09-06 | Discharge: 2024-09-06 | Disposition: A | Discharge status: Home / Self Care | Source: Ambulatory Visit | Attending: Family Medicine | Admitting: Family Medicine

## 2024-09-06 ENCOUNTER — Ambulatory Visit: Payer: Self-pay | Admitting: Family Medicine

## 2024-09-06 DIAGNOSIS — R928 Other abnormal and inconclusive findings on diagnostic imaging of breast: Secondary | ICD-10-CM

## 2024-09-08 NOTE — Progress Notes (Signed)
 Patient reviewed lab results and provider recommendations via MyChart

## 2024-09-19 ENCOUNTER — Other Ambulatory Visit: Payer: Self-pay | Admitting: Family Medicine

## 2024-10-22 ENCOUNTER — Other Ambulatory Visit: Payer: Self-pay | Admitting: Family Medicine

## 2024-10-23 NOTE — Telephone Encounter (Signed)
 Complete

## 2024-11-19 ENCOUNTER — Other Ambulatory Visit: Payer: Self-pay | Admitting: Family Medicine

## 2024-11-29 ENCOUNTER — Encounter: Admitting: Family Medicine

## 2024-11-30 ENCOUNTER — Encounter: Payer: Self-pay | Admitting: Family Medicine

## 2024-11-30 ENCOUNTER — Ambulatory Visit: Admitting: Family Medicine

## 2024-11-30 VITALS — BP 130/80 | HR 79 | Ht 68.0 in | Wt 193.2 lb

## 2024-11-30 DIAGNOSIS — Z136 Encounter for screening for cardiovascular disorders: Secondary | ICD-10-CM

## 2024-11-30 DIAGNOSIS — Z0000 Encounter for general adult medical examination without abnormal findings: Secondary | ICD-10-CM

## 2024-11-30 DIAGNOSIS — Z130 Encounter for screening for diseases of the blood and blood-forming organs and certain disorders involving the immune mechanism: Secondary | ICD-10-CM

## 2024-11-30 MED ORDER — CEPHALEXIN 500 MG PO CAPS: 500.0000 mg | ORAL_CAPSULE | Freq: Three times a day (TID) | ORAL | 0 refills | Status: AC

## 2024-12-01 ENCOUNTER — Telehealth: Payer: Self-pay

## 2024-12-01 ENCOUNTER — Ambulatory Visit: Payer: Self-pay | Admitting: Family Medicine

## 2024-12-01 LAB — CBC WITH DIFFERENTIAL/PLATELET
Basophils Absolute: 0 10*3/uL (ref 0.0–0.2)
Basos: 1 %
EOS (ABSOLUTE): 0.1 10*3/uL (ref 0.0–0.4)
Eos: 1 %
Hematocrit: 41.5 % (ref 34.0–46.6)
Hemoglobin: 13.9 g/dL (ref 11.1–15.9)
Immature Grans (Abs): 0 10*3/uL (ref 0.0–0.1)
Immature Granulocytes: 0 %
Lymphocytes Absolute: 2.6 10*3/uL (ref 0.7–3.1)
Lymphs: 43 %
MCH: 29.1 pg (ref 26.6–33.0)
MCHC: 33.5 g/dL (ref 31.5–35.7)
MCV: 87 fL (ref 79–97)
Monocytes Absolute: 0.5 10*3/uL (ref 0.1–0.9)
Monocytes: 8 %
Neutrophils Absolute: 2.8 10*3/uL (ref 1.4–7.0)
Neutrophils: 47 %
Platelets: 316 10*3/uL (ref 150–450)
RBC: 4.77 x10E6/uL (ref 3.77–5.28)
RDW: 12.5 % (ref 11.7–15.4)
WBC: 5.9 10*3/uL (ref 3.4–10.8)

## 2024-12-01 LAB — LIPID PANEL
Chol/HDL Ratio: 4.9 ratio — ABNORMAL HIGH (ref 0.0–4.4)
Cholesterol, Total: 248 mg/dL — ABNORMAL HIGH (ref 100–199)
HDL: 51 mg/dL
LDL Chol Calc (NIH): 177 mg/dL — ABNORMAL HIGH (ref 0–99)
Triglycerides: 111 mg/dL (ref 0–149)
VLDL Cholesterol Cal: 20 mg/dL (ref 5–40)

## 2024-12-01 LAB — COMPREHENSIVE METABOLIC PANEL WITH GFR
ALT: 14 [IU]/L (ref 0–32)
AST: 16 [IU]/L (ref 0–40)
Albumin: 4.6 g/dL (ref 3.8–4.9)
Alkaline Phosphatase: 79 [IU]/L (ref 49–135)
BUN/Creatinine Ratio: 17 (ref 9–23)
BUN: 21 mg/dL (ref 6–24)
Bilirubin Total: 0.3 mg/dL (ref 0.0–1.2)
CO2: 25 mmol/L (ref 20–29)
Calcium: 10.2 mg/dL (ref 8.7–10.2)
Chloride: 96 mmol/L (ref 96–106)
Creatinine, Ser: 1.25 mg/dL — ABNORMAL HIGH (ref 0.57–1.00)
Globulin, Total: 2.7 g/dL (ref 1.5–4.5)
Glucose: 88 mg/dL (ref 70–99)
Potassium: 4.4 mmol/L (ref 3.5–5.2)
Sodium: 138 mmol/L (ref 134–144)
Total Protein: 7.3 g/dL (ref 6.0–8.5)
eGFR: 50 mL/min/{1.73_m2} — ABNORMAL LOW

## 2024-12-01 LAB — HEMOGLOBIN A1C
Est. average glucose Bld gHb Est-mCnc: 123 mg/dL
Hgb A1c MFr Bld: 5.9 % — ABNORMAL HIGH (ref 4.8–5.6)

## 2024-12-01 LAB — VITAMIN D 25 HYDROXY (VIT D DEFICIENCY, FRACTURES): Vit D, 25-Hydroxy: 63.7 ng/mL (ref 30.0–100.0)

## 2024-12-01 MED ORDER — ATORVASTATIN CALCIUM 10 MG PO TABS: 10.0000 mg | ORAL_TABLET | Freq: Every day | ORAL | 1 refills | Status: DC

## 2024-12-01 MED ORDER — ATORVASTATIN CALCIUM 10 MG PO TABS: 10.0000 mg | ORAL_TABLET | Freq: Every day | ORAL | 1 refills | Status: AC

## 2024-12-01 NOTE — Progress Notes (Signed)
 "  Established Patient Office Visit  Subjective    Patient ID: [name], female    DOB: July 01, 1966  Age: 59 y.o. MRN: [id number]  CC:  Chief Complaint  Patient presents with   Annual Exam    HPI [name] presents for routine annual exam. Patient reports a small abscess under her left underarm.   Outpatient Encounter Medications as of 11/30/2024  Medication Sig   Accu-Chek FastClix Lancets MISC USE TO CHECK BLOOD SUGAR TWICE A DAY   ACCU-CHEK GUIDE TEST test strip USE 1 EACH BY OTHER ROUTE 2 (TWO) TIMES DAILY. E11.9   BD ULTRA-FINE PEN NEEDLES 29G X 12.7MM MISC USE ONCE DAILY AS DIRECTED   cephALEXin  (KEFLEX ) 500 MG capsule Take 1 capsule (500 mg total) by mouth 3 (three) times daily.   hydrochlorothiazide  (MICROZIDE ) 12.5 MG capsule Take 1 capsule by mouth once daily   lisinopril  (ZESTRIL ) 10 MG tablet Take 1 tablet by mouth once daily   TRULICITY  4.5 MG/0.5ML SOAJ INJECT 4.5 MG INTO THE SKIN ONCE A WEEK AS DIRECTED   albuterol  (VENTOLIN  HFA) 108 (90 Base) MCG/ACT inhaler Inhale 2 puffs into the lungs every 6 (six) hours as needed for wheezing or shortness of breath. (Patient not taking: Reported on 11/23/2023)   Blood Glucose Monitoring Suppl (ACCU-CHEK GUIDE) w/Device KIT Please specify directions, refills and quantity (Patient taking differently: 1 each by Other route daily. E11.9)   dextromethorphan-guaiFENesin  (MUCINEX  DM) 30-600 MG 12hr tablet Take 1 tablet by mouth 2 (two) times daily. (Patient not taking: Reported on 11/23/2023)   ibuprofen  (ADVIL ) 800 MG tablet Take 1 tablet (800 mg total) by mouth every 8 (eight) hours as needed (pain). (Patient not taking: Reported on 11/23/2023)   meloxicam  (MOBIC ) 15 MG tablet Take 15 mg by mouth 3 (three) times daily. (Patient not taking: Reported on 11/23/2023)   oseltamivir  (TAMIFLU ) 75 MG capsule Take 1 capsule (75 mg total) by mouth every 12 (twelve) hours. (Patient not taking: Reported on 11/23/2023)   Spacer/Aero-Holding  Chambers DEVI 1 Units by Does not apply route in the morning, at noon, and at bedtime. (Patient not taking: Reported on 11/23/2023)   No facility-administered encounter medications on file as of 11/30/2024.    Past Medical History:  Diagnosis Date   Acute meniscal tear of left knee    Diabetes mellitus without complication (HCC)    History of hypothyroidism    Prediabetes 09/20/2017   Elevated blood glucose since 2013 possibly prior.    Past Surgical History:  Procedure Laterality Date   HYSTEROSCOPY W/ THERMACHOICE ENDOMETRIAL ABLATION  03-16-2007   KNEE ARTHROSCOPY Right    KNEE ARTHROSCOPY WITH MEDIAL MENISECTOMY Left 05/17/2015   Procedure: LEFT KNEE ARTHROSCOPY WITH MEDIAL MENISECTOMY, CHONDROPLASTY PATELLA;  Surgeon: Tanda Heading, MD;  Location: [facility] [place];  Service: Orthopedics;  Laterality: Left;   LAPAROSOCPY BILATERAL TUBAL LIGATION W/ CAUTERIZATION  10-15-2000   VAGINAL HYSTERECTOMY  01/25/2012   Procedure: HYSTERECTOMY VAGINAL;  Surgeon: Evalene SHAUNNA Organ, MD;  Location: [facility];  Service: Gynecology;  Laterality: N/A;    Family History  Problem Relation Age of Onset   Hypertension Mother    Cancer Mother        LUG, THROAT, & BACK   Breast cancer Mother    Cancer Father        THROAT   Throat cancer Father    Cancer Maternal Grandmother        KIDNEY   Cancer Maternal Grandfather  COLON   Colon cancer Maternal Grandfather 50   Diabetes Daughter    Stomach cancer Neg Hx     Social History   Socioeconomic History   Marital status: Married    Spouse name: Not on file   Number of children: Not on file   Years of education: Not on file   Highest education level: Not on file  Occupational History   Not on file  Tobacco Use   Smoking status: Never    Passive exposure: Never   Smokeless tobacco: Never  Vaping Use   Vaping status: Never Used  Substance and Sexual Activity   Alcohol use: No    Alcohol/week: 0.0 standard drinks of  alcohol   Drug use: No   Sexual activity: Not Currently    Birth control/protection: Surgical    Comment: DES NEG,DECLINED INSURANCE QUESTIONS, Hyst  Other Topics Concern   Not on file  Social History Narrative   Not on file   Social Drivers of Health   Tobacco Use: Low Risk (11/30/2024)   Patient History    Smoking Tobacco Use: Never    Smokeless Tobacco Use: Never    Passive Exposure: Never  Financial Resource Strain: Low Risk (11/30/2024)   Overall Financial Resource Strain (CARDIA)    Difficulty of Paying Living Expenses: Not hard at all  Food Insecurity: No Food Insecurity (11/23/2023)   Hunger Vital Sign    Worried About Running Out of Food in the Last Year: Never true    Ran Out of Food in the Last Year: Never true  Transportation Needs: No Transportation Needs (11/23/2023)   PRAPARE - Administrator, Civil Service (Medical): No    Lack of Transportation (Non-Medical): No  Physical Activity: Insufficiently Active (11/30/2024)   Exercise Vital Sign    Days of Exercise per Week: 3 days    Minutes of Exercise per Session: 30 min  Stress: No Stress Concern Present (11/30/2024)   [employer] of Occupational Health - Occupational Stress Questionnaire    Feeling of Stress: Only a little  Social Connections: Moderately Integrated (11/23/2023)   Social Connection and Isolation Panel    Frequency of Communication with Friends and Family: More than three times a week    Frequency of Social Gatherings with Friends and Family: Once a week    Attends Religious Services: More than 4 times per year    Active Member of [employer] or Organizations: Yes    Attends Banker Meetings: More than 4 times per year    Marital Status: Divorced  Intimate Partner Violence: Not At Risk (11/23/2023)   Humiliation, Afraid, Rape, and Kick questionnaire    Fear of Current or Ex-Partner: No    Emotionally Abused: No    Physically Abused: No    Sexually Abused: No  Depression  (PHQ2-9): Low Risk (11/03/2023)   Depression (PHQ2-9)    PHQ-2 Score: 0  Alcohol Screen: Low Risk (11/30/2024)   Alcohol Screen    Last Alcohol Screening Score (AUDIT): 0  Housing: Unknown (11/23/2023)   Housing Stability Vital Sign    Unable to Pay for Housing in the Last Year: No    Number of Times Moved in the Last Year: Not on file    Homeless in the Last Year: No  Utilities: Not At Risk (11/23/2023)   [employer] Utilities    Threatened with loss of utilities: No  Health Literacy: Adequate Health Literacy (11/30/2024)   B1300 Health Literacy    Frequency  of need for help with medical instructions: Never    Review of Systems  All other systems reviewed and are negative.       Objective    BP 130/80   Pulse 79   Ht 5' 8 (1.727 m)   Wt 193 lb 3.2 oz (87.6 kg)   LMP 01/03/2012   SpO2 92%   BMI 29.38 kg/m   Physical Exam Vitals and nursing note reviewed.  Constitutional:      General: She is not in acute distress. HENT:     Head: Normocephalic and atraumatic.     Right Ear: Tympanic membrane, ear canal and external ear normal.     Left Ear: Tympanic membrane, ear canal and external ear normal.     Nose: Nose normal.     Mouth/Throat:     Mouth: Mucous membranes are moist.     Pharynx: Oropharynx is clear.  Eyes:     Conjunctiva/sclera: Conjunctivae normal.     Pupils: Pupils are equal, round, and reactive to light.  Neck:     Thyroid : No thyromegaly.  Cardiovascular:     Rate and Rhythm: Normal rate and regular rhythm.     Heart sounds: Normal heart sounds. No murmur heard. Pulmonary:     Effort: Pulmonary effort is normal. No respiratory distress.     Breath sounds: Normal breath sounds.  Abdominal:     General: There is no distension.     Palpations: Abdomen is soft. There is no mass.     Tenderness: There is no abdominal tenderness.  Musculoskeletal:        General: Normal range of motion.     Cervical back: Normal range of motion and neck supple.  Skin:     General: Skin is warm and dry.     Findings: Lesion (solitary fluctuant lesion under left axilla) present.  Neurological:     General: No focal deficit present.     Mental Status: She is alert and oriented to person, place, and time.  Psychiatric:        Mood and Affect: Mood normal.        Behavior: Behavior normal.         Assessment & Plan:   Annual physical exam -     Comprehensive metabolic panel with GFR  Screening for deficiency anemia -     CBC with Differential/Platelet  Encounter for screening for cardiovascular disorders -     Lipid panel  Screening for endocrine/metabolic/immunity disorders -     Hemoglobin A1c -     VITAMIN D  25 Hydroxy (Vit-D Deficiency, Fractures)  Other orders -     Cephalexin ; Take 1 capsule (500 mg total) by mouth 3 (three) times daily.  Dispense: 30 capsule; Refill: 0     No follow-ups on file.   Tanda Raguel SQUIBB, MD  "

## 2024-12-01 NOTE — Telephone Encounter (Signed)
 Copied from CRM [id number]. Topic: Clinical - Prescription Issue >> Dec 01, 2024  2:32 PM Emylou G wrote: Reason for CRM: Patient rcvd alerts about the pharmacy.. she is requesting all refills to please go to [id number] - [place], [place] - [address] [place] [address]
